# Patient Record
Sex: Female | Born: 1980 | Race: White | Hispanic: No | Marital: Married | Smoking: Never smoker
Health system: Southern US, Community
[De-identification: ages and names within clinical notes are randomized; demographics above are authoritative.]

## PROBLEM LIST (undated history)

## (undated) ENCOUNTER — Inpatient Hospital Stay (HOSPITAL_COMMUNITY): Payer: Self-pay

## (undated) DIAGNOSIS — R32 Unspecified urinary incontinence: Secondary | ICD-10-CM

## (undated) DIAGNOSIS — I10 Essential (primary) hypertension: Secondary | ICD-10-CM

## (undated) DIAGNOSIS — Z87442 Personal history of urinary calculi: Secondary | ICD-10-CM

## (undated) DIAGNOSIS — R51 Headache: Secondary | ICD-10-CM

## (undated) DIAGNOSIS — F419 Anxiety disorder, unspecified: Secondary | ICD-10-CM

## (undated) DIAGNOSIS — F32A Depression, unspecified: Secondary | ICD-10-CM

## (undated) DIAGNOSIS — F329 Major depressive disorder, single episode, unspecified: Secondary | ICD-10-CM

## (undated) HISTORY — PX: TONSILLECTOMY: SUR1361

## (undated) HISTORY — DX: Unspecified urinary incontinence: R32

## (undated) HISTORY — DX: Personal history of urinary calculi: Z87.442

---
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
## 2002-04-18 ENCOUNTER — Other Ambulatory Visit: Admission: RE | Admit: 2002-04-18 | Discharge: 2002-04-18 | Payer: Self-pay | Admitting: *Deleted

## 2003-01-10 ENCOUNTER — Other Ambulatory Visit: Admission: RE | Admit: 2003-01-10 | Discharge: 2003-01-10 | Payer: Self-pay | Admitting: Family Medicine

## 2004-03-26 ENCOUNTER — Other Ambulatory Visit: Admission: RE | Admit: 2004-03-26 | Discharge: 2004-03-26 | Payer: Self-pay | Admitting: Family Medicine

## 2005-02-21 ENCOUNTER — Encounter: Admission: RE | Admit: 2005-02-21 | Discharge: 2005-03-02 | Payer: Self-pay | Admitting: Family Medicine

## 2005-05-17 ENCOUNTER — Other Ambulatory Visit: Admission: RE | Admit: 2005-05-17 | Discharge: 2005-05-17 | Payer: Self-pay | Admitting: Obstetrics and Gynecology

## 2009-03-17 ENCOUNTER — Ambulatory Visit (HOSPITAL_COMMUNITY): Admission: RE | Admit: 2009-03-17 | Discharge: 2009-03-17 | Payer: Self-pay | Admitting: Obstetrics and Gynecology

## 2009-04-07 ENCOUNTER — Ambulatory Visit (HOSPITAL_COMMUNITY): Admission: RE | Admit: 2009-04-07 | Discharge: 2009-04-07 | Payer: Self-pay | Admitting: Obstetrics and Gynecology

## 2009-04-30 ENCOUNTER — Ambulatory Visit (HOSPITAL_COMMUNITY): Admission: RE | Admit: 2009-04-30 | Discharge: 2009-04-30 | Payer: Self-pay | Admitting: Obstetrics and Gynecology

## 2009-07-27 ENCOUNTER — Inpatient Hospital Stay (HOSPITAL_COMMUNITY): Admission: AD | Admit: 2009-07-27 | Discharge: 2009-08-01 | Payer: Self-pay | Admitting: Obstetrics and Gynecology

## 2010-09-19 LAB — COMPREHENSIVE METABOLIC PANEL
ALT: 21 U/L (ref 0–35)
ALT: 27 U/L (ref 0–35)
Albumin: 2 g/dL — ABNORMAL LOW (ref 3.5–5.2)
Albumin: 2.5 g/dL — ABNORMAL LOW (ref 3.5–5.2)
Alkaline Phosphatase: 186 U/L — ABNORMAL HIGH (ref 39–117)
BUN: 4 mg/dL — ABNORMAL LOW (ref 6–23)
BUN: 5 mg/dL — ABNORMAL LOW (ref 6–23)
BUN: 7 mg/dL (ref 6–23)
CO2: 18 mEq/L — ABNORMAL LOW (ref 19–32)
Calcium: 7.2 mg/dL — ABNORMAL LOW (ref 8.4–10.5)
Calcium: 7.7 mg/dL — ABNORMAL LOW (ref 8.4–10.5)
Calcium: 8.8 mg/dL (ref 8.4–10.5)
Chloride: 108 mEq/L (ref 96–112)
Creatinine, Ser: 0.64 mg/dL (ref 0.4–1.2)
Creatinine, Ser: 0.68 mg/dL (ref 0.4–1.2)
Creatinine, Ser: 0.69 mg/dL (ref 0.4–1.2)
GFR calc Af Amer: >60 mL/min (ref >60)
GFR calc Af Amer: >60 mL/min (ref >60)
GFR calc non Af Amer: >60 mL/min (ref >60)
Glucose, Bld: 84 mg/dL (ref 70–99)
Glucose, Bld: 89 mg/dL (ref 70–99)
Potassium: 3.8 mEq/L (ref 3.5–5.1)
Sodium: 134 mEq/L — ABNORMAL LOW (ref 135–145)
Sodium: 136 mEq/L (ref 135–145)
Total Bilirubin: 0.5 mg/dL (ref 0.3–1.2)
Total Bilirubin: 0.6 mg/dL (ref 0.3–1.2)
Total Protein: 5.2 g/dL — ABNORMAL LOW (ref 6.0–8.3)
Total Protein: 5.3 g/dL — ABNORMAL LOW (ref 6.0–8.3)

## 2010-09-19 LAB — URINALYSIS, ROUTINE W REFLEX MICROSCOPIC
Bilirubin Urine: NEGATIVE
Glucose, UA: NEGATIVE mg/dL
Ketones, ur: NEGATIVE mg/dL
pH: 6.5 (ref 5.0–8.0)

## 2010-09-19 LAB — CBC
HCT: 33.3 % — ABNORMAL LOW (ref 36.0–46.0)
HCT: 36 % (ref 36.0–46.0)
HCT: 38.1 % (ref 36.0–46.0)
Hemoglobin: 11 g/dL — ABNORMAL LOW (ref 12.0–15.0)
Hemoglobin: 11.7 g/dL — ABNORMAL LOW (ref 12.0–15.0)
MCHC: 32.3 g/dL (ref 30.0–36.0)
MCHC: 32.6 g/dL (ref 30.0–36.0)
MCHC: 32.9 g/dL (ref 30.0–36.0)
MCV: 84.8 fL (ref 78.0–100.0)
MCV: 85.7 fL (ref 78.0–100.0)
MCV: 86.6 fL (ref 78.0–100.0)
Platelets: 247 10*3/uL (ref 150–400)
RBC: 3.13 MIL/uL — ABNORMAL LOW (ref 3.87–5.11)
RBC: 3.78 MIL/uL — ABNORMAL LOW (ref 3.87–5.11)
RDW: 14.3 % (ref 11.5–15.5)
RDW: 14.6 % (ref 11.5–15.5)
RDW: 14.8 % (ref 11.5–15.5)
RDW: 14.9 % (ref 11.5–15.5)
WBC: 10.2 10*3/uL (ref 4.0–10.5)
WBC: 9.3 10*3/uL (ref 4.0–10.5)

## 2010-09-19 LAB — RPR
RPR Ser Ql: NONREACTIVE
RPR Ser Ql: NONREACTIVE

## 2010-09-19 LAB — URIC ACID
Uric Acid, Serum: 5.7 mg/dL (ref 2.4–7.0)
Uric Acid, Serum: 5.9 mg/dL (ref 2.4–7.0)

## 2010-09-19 LAB — URINALYSIS, DIPSTICK ONLY
Glucose, UA: NEGATIVE mg/dL
Hgb urine dipstick: NEGATIVE
Leukocytes, UA: NEGATIVE
pH: 6 (ref 5.0–8.0)

## 2010-09-19 LAB — URINE MICROSCOPIC-ADD ON

## 2010-09-19 LAB — LACTATE DEHYDROGENASE: LDH: 189 U/L (ref 94–250)

## 2011-11-01 ENCOUNTER — Encounter (HOSPITAL_COMMUNITY): Payer: Self-pay | Admitting: Pharmacist

## 2011-11-01 ENCOUNTER — Other Ambulatory Visit: Payer: Self-pay | Admitting: Obstetrics and Gynecology

## 2011-11-03 ENCOUNTER — Encounter (HOSPITAL_COMMUNITY): Payer: Self-pay | Admitting: *Deleted

## 2011-11-03 ENCOUNTER — Encounter (HOSPITAL_COMMUNITY): Payer: Self-pay | Admitting: Anesthesiology

## 2011-11-03 ENCOUNTER — Ambulatory Visit (HOSPITAL_COMMUNITY)
Admission: RE | Admit: 2011-11-03 | Discharge: 2011-11-03 | Disposition: A | Discharge status: Home / Self Care | Payer: BC Managed Care – PPO | Source: Ambulatory Visit | Attending: Obstetrics and Gynecology | Admitting: Obstetrics and Gynecology

## 2011-11-03 ENCOUNTER — Encounter (HOSPITAL_COMMUNITY)
Admission: RE | Disposition: A | Discharge status: Home / Self Care | Payer: Self-pay | Source: Ambulatory Visit | Attending: Obstetrics and Gynecology

## 2011-11-03 ENCOUNTER — Ambulatory Visit (HOSPITAL_COMMUNITY): Payer: BC Managed Care – PPO | Admitting: Anesthesiology

## 2011-11-03 DIAGNOSIS — O034 Incomplete spontaneous abortion without complication: Secondary | ICD-10-CM | POA: Insufficient documentation

## 2011-11-03 DIAGNOSIS — O039 Complete or unspecified spontaneous abortion without complication: Secondary | ICD-10-CM

## 2011-11-03 HISTORY — PX: DILATION AND EVACUATION: SHX1459

## 2011-11-03 LAB — CBC
Hemoglobin: 14.4 g/dL (ref 12.0–15.0)
MCH: 28.9 pg (ref 26.0–34.0)
Platelets: 148 10*3/uL — ABNORMAL LOW (ref 150–400)
RBC: 4.99 MIL/uL (ref 3.87–5.11)
WBC: 9.2 10*3/uL (ref 4.0–10.5)

## 2011-11-03 SURGERY — DILATION AND EVACUATION, UTERUS
Anesthesia: Monitor Anesthesia Care | Site: Uterus | Wound class: Clean Contaminated

## 2011-11-03 MED ORDER — MEPERIDINE HCL 25 MG/ML IJ SOLN: 6.2500 mg | INTRAMUSCULAR | Status: DC | PRN

## 2011-11-03 MED ORDER — ONDANSETRON HCL 4 MG/2ML IJ SOLN
INTRAMUSCULAR | Status: AC
  Filled 2011-11-03: qty 2

## 2011-11-03 MED ORDER — DEXAMETHASONE SODIUM PHOSPHATE 10 MG/ML IJ SOLN
INTRAMUSCULAR | Status: AC
  Filled 2011-11-03: qty 1

## 2011-11-03 MED ORDER — LIDOCAINE HCL (CARDIAC) 20 MG/ML IV SOLN
INTRAVENOUS | Status: AC
  Filled 2011-11-03: qty 5

## 2011-11-03 MED ORDER — LACTATED RINGERS IV SOLN
INTRAVENOUS | Status: DC
  Administered 2011-11-03 (×2): via INTRAVENOUS

## 2011-11-03 MED ORDER — MIDAZOLAM HCL 2 MG/2ML IJ SOLN
INTRAMUSCULAR | Status: AC
  Filled 2011-11-03: qty 2

## 2011-11-03 MED ORDER — METOCLOPRAMIDE HCL 5 MG/ML IJ SOLN: 10.0000 mg | Freq: Once | INTRAMUSCULAR | Status: DC | PRN

## 2011-11-03 MED ORDER — CEFAZOLIN SODIUM 1-5 GM-% IV SOLN
INTRAVENOUS | Status: AC
  Administered 2011-11-03: 1 g via INTRAVENOUS
  Filled 2011-11-03: qty 50

## 2011-11-03 MED ORDER — MIDAZOLAM HCL 5 MG/5ML IJ SOLN
INTRAMUSCULAR | Status: DC | PRN
  Administered 2011-11-03: 2 mg via INTRAVENOUS

## 2011-11-03 MED ORDER — DEXAMETHASONE SODIUM PHOSPHATE 4 MG/ML IJ SOLN
INTRAMUSCULAR | Status: DC | PRN
  Administered 2011-11-03: 10 mg via INTRAVENOUS

## 2011-11-03 MED ORDER — FENTANYL CITRATE 0.05 MG/ML IJ SOLN
INTRAMUSCULAR | Status: AC
  Administered 2011-11-03: 50 ug via INTRAVENOUS
  Filled 2011-11-03: qty 2

## 2011-11-03 MED ORDER — PROPOFOL 10 MG/ML IV EMUL
INTRAVENOUS | Status: AC
  Filled 2011-11-03: qty 20

## 2011-11-03 MED ORDER — OXYCODONE-ACETAMINOPHEN 5-325 MG PO TABS: 1.0000 | ORAL_TABLET | ORAL | Status: AC | PRN

## 2011-11-03 MED ORDER — PROPOFOL 10 MG/ML IV EMUL
INTRAVENOUS | Status: DC | PRN
  Administered 2011-11-03: 100 mg via INTRAVENOUS

## 2011-11-03 MED ORDER — FENTANYL CITRATE 0.05 MG/ML IJ SOLN
INTRAMUSCULAR | Status: AC
  Filled 2011-11-03: qty 2

## 2011-11-03 MED ORDER — OXYCODONE-ACETAMINOPHEN 5-325 MG PO TABS
1.0000 | ORAL_TABLET | ORAL | Status: DC | PRN
  Administered 2011-11-03: 1 via ORAL

## 2011-11-03 MED ORDER — FENTANYL CITRATE 0.05 MG/ML IJ SOLN
25.0000 ug | INTRAMUSCULAR | Status: DC | PRN
  Administered 2011-11-03 (×2): 50 ug via INTRAVENOUS

## 2011-11-03 MED ORDER — CEFAZOLIN SODIUM 1-5 GM-% IV SOLN: 1.0000 g | INTRAVENOUS | Status: DC

## 2011-11-03 MED ORDER — FENTANYL CITRATE 0.05 MG/ML IJ SOLN
INTRAMUSCULAR | Status: DC | PRN
  Administered 2011-11-03 (×2): 50 ug via INTRAVENOUS

## 2011-11-03 MED ORDER — LIDOCAINE HCL 1 % IJ SOLN
INTRAMUSCULAR | Status: DC | PRN
  Administered 2011-11-03: 10 mL

## 2011-11-03 MED ORDER — ONDANSETRON HCL 4 MG/2ML IJ SOLN
INTRAMUSCULAR | Status: DC | PRN
  Administered 2011-11-03: 4 mg via INTRAVENOUS

## 2011-11-03 MED ORDER — OXYCODONE-ACETAMINOPHEN 5-325 MG PO TABS
ORAL_TABLET | ORAL | Status: AC
  Filled 2011-11-03: qty 1

## 2011-11-03 SURGICAL SUPPLY — 23 items
CATH ROBINSON RED A/P 16FR (CATHETERS) ×2 IMPLANT
CLOTH BEACON ORANGE TIMEOUT ST (SAFETY) ×2 IMPLANT
DECANTER SPIKE VIAL GLASS SM (MISCELLANEOUS) ×4 IMPLANT
GLOVE BIOGEL PI IND STRL 7.0 (GLOVE) ×2 IMPLANT
GLOVE BIOGEL PI INDICATOR 7.0 (GLOVE) ×2
GLOVE ECLIPSE 7.0 STRL STRAW (GLOVE) ×4 IMPLANT
GLOVE SURG SS PI 7.0 STRL IVOR (GLOVE) ×2 IMPLANT
GLOVE SURG SS PI 7.5 STRL IVOR (GLOVE) ×2 IMPLANT
GOWN PREVENTION PLUS LG XLONG (DISPOSABLE) ×2 IMPLANT
GOWN PREVENTION PLUS XLARGE (GOWN DISPOSABLE) ×6 IMPLANT
GOWN SURG XXL (GOWNS) ×2 IMPLANT
KIT BERKELEY 1ST TRIMESTER 3/8 (MISCELLANEOUS) ×2 IMPLANT
NEEDLE SPNL 22GX3.5 QUINCKE BK (NEEDLE) ×2 IMPLANT
NS IRRIG 1000ML POUR BTL (IV SOLUTION) ×2 IMPLANT
PACK VAGINAL MINOR WOMEN LF (CUSTOM PROCEDURE TRAY) ×2 IMPLANT
PAD PREP 24X48 CUFFED NSTRL (MISCELLANEOUS) ×2 IMPLANT
SET BERKELEY SUCTION TUBING (SUCTIONS) ×2 IMPLANT
SYR CONTROL 10ML LL (SYRINGE) ×2 IMPLANT
TOWEL OR 17X24 6PK STRL BLUE (TOWEL DISPOSABLE) ×4 IMPLANT
VACURETTE 10 RIGID CVD (CANNULA) IMPLANT
VACURETTE 7MM CVD STRL WRAP (CANNULA) ×2 IMPLANT
VACURETTE 8 RIGID CVD (CANNULA) IMPLANT
VACURETTE 9 RIGID CVD (CANNULA) IMPLANT

## 2011-11-03 NOTE — Transfer of Care (Signed)
Immediate Anesthesia Transfer of Care Note  Patient: Laura Combs  Procedure(s) Performed: Procedure(s) (LRB): DILATATION AND EVACUATION (N/A)  Patient Location: PACU  Anesthesia Type: General  Level of Consciousness: awake, alert  and oriented  Airway & Oxygen Therapy: Patient Spontanous Breathing  Post-op Assessment: Report given to PACU RN  Post vital signs: Reviewed and stable  Complications: No apparent anesthesia complications

## 2011-11-03 NOTE — Anesthesia Postprocedure Evaluation (Signed)
  Anesthesia Post-op Note  Patient: Laura Combs  Procedure(s) Performed: Procedure(s) (LRB): DILATATION AND EVACUATION (N/A)  Patient Location: PACU  Anesthesia Type: General  Level of Consciousness: awake, alert  and oriented  Airway and Oxygen Therapy: Patient Spontanous Breathing  Post-op Pain: none  Post-op Assessment: Post-op Vital signs reviewed, Patient's Cardiovascular Status Stable, Respiratory Function Stable, Patent Airway, No signs of Nausea or vomiting and Pain level controlled  Post-op Vital Signs: Reviewed and stable  Complications: No apparent anesthesia complications

## 2011-11-03 NOTE — Op Note (Signed)
NAMEVALENCIA, Laura NO.:  Combs  MEDICAL RECORD NO.:  [id number]  LOCATION:  [facility]                          FACILITY:  [facility]  PHYSICIAN:  Malva Limes, M.D.    DATE OF BIRTH:  [birth year]  DATE OF PROCEDURE:  11/03/2011 DATE OF DISCHARGE:                              OPERATIVE REPORT   PREOPERATIVE DIAGNOSIS:  Spontaneous abortion.  POSTOPERATIVE DIAGNOSIS:  Spontaneous abortion.  PROCEDURE:  Dilation and evacuation.  SURGEON:  Malva Limes, MD  ANESTHESIA:  General with paracervical block.  DRAINS:  None.  ANTIBIOTICS:  Ancef 1 g.  SPECIMENS:  Products of conception sent to Pathology.  ESTIMATED BLOOD LOSS:  25 mL.  PREGNANCY COMPLICATIONS:  None.  DESCRIPTION OF PROCEDURE:  The patient was taken to the operating room where she was placed in a dorsal supine position.  A general anesthetic was administered without difficulty.  She was placed in dorsal lithotomy position.  She was prepped and draped in the usual fashion for this procedure.  A sterile speculum placed in the vagina, 10 mL of 1% lidocaine was used for paracervical block.  A single-tooth tenaculum was applied to the anterior cervical lip.  The cervical os was serially dilated to a 29-French.  A 7-mm suction cannula was placed into the uterine cavity.  Products of conception withdrawn.  Sharp curettage was performed followed by repeat suction.  The patient tolerated the procedure well.  She was taken to recovery room in stable condition. Instrument, lap count was correct x2.  The patient is to be discharged to home with Percocet to take p.r.n.  She will follow up in the office in 4 weeks.  The patient's blood type is Rh positive and therefore no RhoGAM is indicated.  She was told to call the office with any heavy bleeding, fever, or severe pain.          ______________________________ Malva Limes, M.D.     MA/MEDQ  D:  11/03/2011  T:  11/03/2011  Job:  [id number]

## 2011-11-03 NOTE — H&P (Signed)
Pt is a 31 year old white female who presents to the or for a D&C secondary to a SAB. PE: VSSAF        HEENT- wnl        ABD- soft, non tender IMP/SAB PLAN/ D&C

## 2011-11-03 NOTE — Discharge Instructions (Signed)

## 2011-11-03 NOTE — Anesthesia Preprocedure Evaluation (Signed)
Anesthesia Evaluation  Patient identified by MRN, date of birth, ID band Patient awake    Reviewed: Allergy & Precautions, H&P , NPO status , Patient's Chart, lab work & pertinent test results  Airway Mallampati: III TM Distance: >3 FB Neck ROM: full    Dental No notable dental hx. (+) Teeth Intact   Pulmonary neg pulmonary ROS,  breath sounds clear to auscultation  Pulmonary exam normal       Cardiovascular negative cardio ROS  Rhythm:regular Rate:Normal     Neuro/Psych negative neurological ROS  negative psych ROS   GI/Hepatic negative GI ROS, Neg liver ROS,   Endo/Other  negative endocrine ROS  Renal/GU negative Renal ROS  negative genitourinary   Musculoskeletal   Abdominal Normal abdominal exam  (+)   Peds  Hematology negative hematology ROS (+)   Anesthesia Other Findings   Reproductive/Obstetrics (+) Pregnancy                           Anesthesia Physical Anesthesia Plan  ASA: III  Anesthesia Plan: MAC   Post-op Pain Management:    Induction:   Airway Management Planned:   Additional Equipment:   Intra-op Plan:   Post-operative Plan:   Informed Consent: I have reviewed the patients History and Physical, chart, labs and discussed the procedure including the risks, benefits and alternatives for the proposed anesthesia with the patient or authorized representative who has indicated his/her understanding and acceptance.   Dental Advisory Given  Plan Discussed with: Surgeon, Anesthesiologist and CRNA  Anesthesia Plan Comments:         Anesthesia Quick Evaluation

## 2011-11-03 NOTE — Anesthesia Procedure Notes (Signed)
Procedure Name: LMA Insertion Date/Time: 11/03/2011 8:32 AM Performed by: Floye Fesler, Jannet Askew Pre-anesthesia Checklist: Patient identified, Patient being monitored, Emergency Drugs available and Timeout performed Patient Re-evaluated:Patient Re-evaluated prior to inductionOxygen Delivery Method: Circle system utilized Preoxygenation: Pre-oxygenation with 100% oxygen Intubation Type: IV induction Ventilation: Mask ventilation without difficulty Number of attempts: 1 Placement Confirmation: positive ETCO2 and breath sounds checked- equal and bilateral

## 2011-11-07 ENCOUNTER — Encounter (HOSPITAL_COMMUNITY): Payer: Self-pay | Admitting: Obstetrics and Gynecology

## 2012-04-11 LAB — OB RESULTS CONSOLE GC/CHLAMYDIA
Chlamydia: NEGATIVE
Gonorrhea: NEGATIVE

## 2012-04-11 LAB — OB RESULTS CONSOLE HEPATITIS B SURFACE ANTIGEN: Hepatitis B Surface Ag: NEGATIVE

## 2012-04-11 LAB — OB RESULTS CONSOLE RPR: RPR: NONREACTIVE

## 2012-04-11 LAB — OB RESULTS CONSOLE ANTIBODY SCREEN: Antibody Screen: NEGATIVE

## 2012-06-06 ENCOUNTER — Other Ambulatory Visit: Payer: Self-pay

## 2012-06-06 ENCOUNTER — Other Ambulatory Visit (HOSPITAL_COMMUNITY): Payer: Self-pay | Admitting: Obstetrics and Gynecology

## 2012-06-06 DIAGNOSIS — Z3689 Encounter for other specified antenatal screening: Secondary | ICD-10-CM

## 2012-06-28 ENCOUNTER — Ambulatory Visit (HOSPITAL_COMMUNITY)
Admission: RE | Admit: 2012-06-28 | Discharge: 2012-06-28 | Disposition: A | Discharge status: Home / Self Care | Payer: BC Managed Care – PPO | Source: Ambulatory Visit | Attending: Obstetrics and Gynecology | Admitting: Obstetrics and Gynecology

## 2012-06-28 DIAGNOSIS — Z1389 Encounter for screening for other disorder: Secondary | ICD-10-CM | POA: Insufficient documentation

## 2012-06-28 DIAGNOSIS — Z363 Encounter for antenatal screening for malformations: Secondary | ICD-10-CM | POA: Insufficient documentation

## 2012-06-28 DIAGNOSIS — O358XX0 Maternal care for other (suspected) fetal abnormality and damage, not applicable or unspecified: Secondary | ICD-10-CM | POA: Insufficient documentation

## 2012-06-28 DIAGNOSIS — E669 Obesity, unspecified: Secondary | ICD-10-CM | POA: Insufficient documentation

## 2012-06-28 DIAGNOSIS — Z3689 Encounter for other specified antenatal screening: Secondary | ICD-10-CM

## 2012-07-04 NOTE — L&D Delivery Note (Signed)
Patient was C/C/+3 and pushed for 2 minutes with epidural.   NSVD  female infant, Apgars 9,9, weight P.   The patient had no lacerations. Fundus was firm. EBL was expected. Placenta was delivered intact. Vagina was clear.  Baby was vigorous to bedside.  Bernard Slayden A

## 2012-07-11 ENCOUNTER — Encounter (HOSPITAL_COMMUNITY): Payer: Self-pay | Admitting: Nurse Practitioner

## 2012-07-16 ENCOUNTER — Other Ambulatory Visit (HOSPITAL_COMMUNITY): Payer: Self-pay | Admitting: Obstetrics and Gynecology

## 2012-07-16 DIAGNOSIS — O358XX0 Maternal care for other (suspected) fetal abnormality and damage, not applicable or unspecified: Secondary | ICD-10-CM

## 2012-07-19 ENCOUNTER — Encounter (HOSPITAL_COMMUNITY): Payer: Self-pay

## 2012-07-19 ENCOUNTER — Ambulatory Visit (HOSPITAL_COMMUNITY)
Admission: RE | Admit: 2012-07-19 | Discharge: 2012-07-19 | Disposition: A | Discharge status: Home / Self Care | Payer: BC Managed Care – PPO | Source: Ambulatory Visit | Attending: Obstetrics and Gynecology | Admitting: Obstetrics and Gynecology

## 2012-07-19 VITALS — BP 142/84 | HR 96 | Wt 275.2 lb

## 2012-07-19 DIAGNOSIS — Z3689 Encounter for other specified antenatal screening: Secondary | ICD-10-CM | POA: Insufficient documentation

## 2012-07-19 DIAGNOSIS — O99210 Obesity complicating pregnancy, unspecified trimester: Secondary | ICD-10-CM | POA: Insufficient documentation

## 2012-07-19 DIAGNOSIS — O358XX0 Maternal care for other (suspected) fetal abnormality and damage, not applicable or unspecified: Secondary | ICD-10-CM

## 2012-07-19 DIAGNOSIS — E669 Obesity, unspecified: Secondary | ICD-10-CM | POA: Insufficient documentation

## 2012-07-19 NOTE — Progress Notes (Signed)
Laura Combs  was seen today for an ultrasound appointment.  See full report in [facility].  Impression: Single IUP at 24 2/7 weeks Normal fetal anatomic survey Somewhat limited views of the fetal heart were obtained, but the [facility] and outflow tracts were visualized and appear normal No markers associated with aneuploidy were noted Normal amniotic fluid volume  Recommendations: Recommend follow-up ultrasound examination in 6 weeks for growth.  Alpha Gula, MD

## 2012-08-12 ENCOUNTER — Inpatient Hospital Stay (HOSPITAL_COMMUNITY)
Admission: AD | Admit: 2012-08-12 | Discharge: 2012-08-12 | Disposition: A | Discharge status: Home / Self Care | Payer: BC Managed Care – PPO | Source: Ambulatory Visit | Attending: Obstetrics and Gynecology | Admitting: Obstetrics and Gynecology

## 2012-08-12 ENCOUNTER — Encounter (HOSPITAL_COMMUNITY): Payer: Self-pay | Admitting: Obstetrics and Gynecology

## 2012-08-12 DIAGNOSIS — O99891 Other specified diseases and conditions complicating pregnancy: Secondary | ICD-10-CM | POA: Insufficient documentation

## 2012-08-12 DIAGNOSIS — M549 Dorsalgia, unspecified: Secondary | ICD-10-CM | POA: Insufficient documentation

## 2012-08-12 LAB — URINALYSIS, ROUTINE W REFLEX MICROSCOPIC
Bilirubin Urine: NEGATIVE
Ketones, ur: NEGATIVE mg/dL
Nitrite: NEGATIVE
Protein, ur: NEGATIVE mg/dL
Urobilinogen, UA: 0.2 mg/dL (ref 0.0–1.0)

## 2012-08-12 MED ORDER — OXYCODONE-ACETAMINOPHEN 5-325 MG PO TABS
1.0000 | ORAL_TABLET | Freq: Four times a day (QID) | ORAL | Status: DC | PRN
  Administered 2012-08-12: 2 via ORAL
  Filled 2012-08-12: qty 2

## 2012-08-12 MED ORDER — OXYCODONE-ACETAMINOPHEN 5-325 MG PO TABS: 1.0000 | ORAL_TABLET | ORAL | Status: DC | PRN

## 2012-08-12 NOTE — MAU Note (Signed)
"  I started hurting on Thursday off and on.  I was fine yesterday.  The pain became severe and constant on this morning.  The pain is on the LT side of my back (pt demonstrates with hand).  I've never had a kidney stone or infection before, but I know I have something."

## 2012-08-12 NOTE — MAU Provider Note (Signed)
Chief Complaint:  Flank Pain   First Provider Initiated Contact with Patient 08/12/12 1149      HPI: Laura Combs is a 32 y.o. G3P1011 at [redacted]w[redacted]d who presents to maternity admissions reporting onset  3 days ago of left back pain localized to lumbar and mid back paraspinous region. 3 days ago it lasted 2 hours and spontaneously resolved. She had pain intermittently the next day and minimal pain yesterday. Today she's had constant pain since 7 AM and now "very uncomfortable." Has not taken any analgesic. Character is sharp like a continuous cramp. It can be exacerbated by deep breath or position changes. Does not radiate. No antecedent lifting or straining. No dysuria, hematuria, frequency or urgency of urination. No SOB, abdominal pain, fever, chills, nausea vomiting other than slight nausea when she initially had the pain.  Denies contractions, leakage of fluid or vaginal bleeding. Good fetal movement.   Pregnancy Course: Obesity, abnormal glucola,  Past Medical History: History reviewed. No pertinent past medical history. Denies hx stones.   Past obstetric history: OB History   Grav Para Term Preterm Abortions TAB SAB Ect Mult Living   2 0 0 0 1 0 1 0 0 0      # Outc Date GA Lbr Len/2nd Wgt Sex Del Anes PTL Lv   1 SAB            2 CUR             OB Hx: NSVD hx preE and SAB w/ D&E   Past Surgical History: Past Surgical History  Procedure Laterality Date  . Dilation and evacuation  11/03/2011    Procedure: DILATATION AND EVACUATION;  Surgeon: Levi Aland, MD;  Location: [facility];  Service: Gynecology;  Laterality: N/A;    Family History: History reviewed. No pertinent family history.  Social History: History  Substance Use Topics  . Smoking status: Never Smoker   . Smokeless tobacco: Never Used  . Alcohol Use: No    Allergies: No Known Allergies  Meds:  Prescriptions prior to admission  Medication Sig Dispense Refill  . acetaminophen (TYLENOL) 500 MG tablet Take 1,000  mg by mouth every 6 (six) hours as needed for pain.      . Prenatal Vit-Fe Fumarate-FA (PRENATAL MULTIVITAMIN) TABS Take 1 tablet by mouth daily.        ROS: Pertinent findings in history of present illness.  Physical Exam  Height 5\' 3"  (1.6 m), weight 274 lb 12.8 oz (124.648 kg), last menstrual period 01/31/2012. GENERAL: Well-developed, well-nourished female in mild acute distress.  HEENT: normocephalic HEART: normal rate Lungs: CTA bilat BACK: no CVAT, mildly TTP left back at waist level RESP: normal effort ABDOMEN: Soft, non-tender, gravid appropriate for gestational age EXTREMITIES: Nontender, no edema NEURO: alert and oriented, grossly nonfocal    FHT:  Baseline 140 , moderate variability, accelerations present, no decelerations Contractions: none   Labs: Results for orders placed during the hospital encounter of 08/12/12 (from the past 24 hour(s))  URINALYSIS, ROUTINE W REFLEX MICROSCOPIC     Status: Abnormal   Collection Time    08/12/12 10:45 AM      Result Value Range   Color, Urine YELLOW  YELLOW   APPearance CLEAR  CLEAR   Specific Gravity, Urine 1.015  1.005 - 1.030   pH 7.0  5.0 - 8.0   Glucose, UA NEGATIVE  NEGATIVE mg/dL   Hgb urine dipstick SMALL (*) NEGATIVE   Bilirubin Urine NEGATIVE  NEGATIVE  Ketones, ur NEGATIVE  NEGATIVE mg/dL   Protein, ur NEGATIVE  NEGATIVE mg/dL   Urobilinogen, UA 0.2  0.0 - 1.0 mg/dL   Nitrite NEGATIVE  NEGATIVE   Leukocytes, UA SMALL (*) NEGATIVE  URINE MICROSCOPIC-ADD ON     Status: Abnormal   Collection Time    08/12/12 10:45 AM      Result Value Range   Squamous Epithelial / LPF MANY (*) RARE   WBC, UA 3-6  <3 WBC/hpf   RBC / HPF 7-10  <3 RBC/hpf   Bacteria, UA MANY (*) RARE   Urine-Other MUCOUS PRESENT     Urine culture sent  Imaging:  US Ob Follow Up  07/19/2012  OBSTETRICAL ULTRASOUND: This exam was performed within a [facility]. The OB US report was generated in the [facility] system, and faxed  to the ordering physician.   This report is also available in [employer] and in the [employer]. See [facility] Obstetric US report.   MAU Course: Percocet  given with improvement Urine C&S sent  Assessment: 1. Back pain complicating pregnancy, second trimester   Probably musculoskeletal. R/O UTI. Doublt ureteral calculus  Plan: C/W Dr. Dareen Piano Discharge home Push fluids    Medication List    TAKE these medications       acetaminophen 500 MG tablet  Commonly known as:  TYLENOL  Take 1,000 mg by mouth every 6 (six) hours as needed for pain.     oxyCODONE-acetaminophen 5-325 MG per tablet  Commonly known as:  PERCOCET/ROXICET  Take 1 tablet by mouth every 4 (four) hours as needed for pain.     prenatal multivitamin Tabs  Take 1 tablet by mouth daily.       Follow-up Information   Schedule an appointment as soon as possible for a visit with Levi Aland, MD. (If symptoms worsen)    Contact information:   [address] [place] [address] [phone]       Danae Orleans, CNM 08/12/2012 11:50 AM  Addendum: Pre-discharge BP 149/86. Will observe and recheck in 15 min.>144/83 (Pt states that is her norm. Full PN record not available) Will D/C home.

## 2012-08-13 LAB — URINE CULTURE: Colony Count: 50000

## 2012-08-17 ENCOUNTER — Encounter (HOSPITAL_COMMUNITY): Payer: Self-pay | Admitting: Emergency Medicine

## 2012-08-17 ENCOUNTER — Encounter (HOSPITAL_COMMUNITY): Payer: Self-pay | Admitting: *Deleted

## 2012-08-17 ENCOUNTER — Emergency Department (INDEPENDENT_AMBULATORY_CARE_PROVIDER_SITE_OTHER): Payer: BC Managed Care – PPO

## 2012-08-17 ENCOUNTER — Emergency Department (HOSPITAL_COMMUNITY)
Admission: EM | Admit: 2012-08-17 | Discharge: 2012-08-18 | Disposition: A | Discharge status: Home / Self Care | Payer: BC Managed Care – PPO | Attending: Emergency Medicine | Admitting: Emergency Medicine

## 2012-08-17 ENCOUNTER — Emergency Department (HOSPITAL_COMMUNITY)
Admission: EM | Admit: 2012-08-17 | Discharge: 2012-08-17 | Disposition: A | Discharge status: Nursing Home (Includes ICF) | Payer: BC Managed Care – PPO | Source: Home / Self Care | Attending: Family Medicine | Admitting: Family Medicine

## 2012-08-17 DIAGNOSIS — R059 Cough, unspecified: Secondary | ICD-10-CM | POA: Insufficient documentation

## 2012-08-17 DIAGNOSIS — J029 Acute pharyngitis, unspecified: Secondary | ICD-10-CM | POA: Insufficient documentation

## 2012-08-17 DIAGNOSIS — R0602 Shortness of breath: Secondary | ICD-10-CM | POA: Insufficient documentation

## 2012-08-17 DIAGNOSIS — R000 Tachycardia, unspecified: Secondary | ICD-10-CM

## 2012-08-17 DIAGNOSIS — Z3490 Encounter for supervision of normal pregnancy, unspecified, unspecified trimester: Secondary | ICD-10-CM

## 2012-08-17 DIAGNOSIS — R05 Cough: Secondary | ICD-10-CM | POA: Insufficient documentation

## 2012-08-17 DIAGNOSIS — Z79899 Other long term (current) drug therapy: Secondary | ICD-10-CM | POA: Insufficient documentation

## 2012-08-17 DIAGNOSIS — R509 Fever, unspecified: Secondary | ICD-10-CM | POA: Insufficient documentation

## 2012-08-17 DIAGNOSIS — J069 Acute upper respiratory infection, unspecified: Secondary | ICD-10-CM

## 2012-08-17 DIAGNOSIS — R490 Dysphonia: Secondary | ICD-10-CM | POA: Insufficient documentation

## 2012-08-17 DIAGNOSIS — O26893 Other specified pregnancy related conditions, third trimester: Secondary | ICD-10-CM

## 2012-08-17 DIAGNOSIS — R0982 Postnasal drip: Secondary | ICD-10-CM | POA: Insufficient documentation

## 2012-08-17 DIAGNOSIS — J3489 Other specified disorders of nose and nasal sinuses: Secondary | ICD-10-CM | POA: Insufficient documentation

## 2012-08-17 DIAGNOSIS — R0789 Other chest pain: Secondary | ICD-10-CM | POA: Insufficient documentation

## 2012-08-17 DIAGNOSIS — E876 Hypokalemia: Secondary | ICD-10-CM

## 2012-08-17 DIAGNOSIS — O26899 Other specified pregnancy related conditions, unspecified trimester: Secondary | ICD-10-CM

## 2012-08-17 DIAGNOSIS — O9989 Other specified diseases and conditions complicating pregnancy, childbirth and the puerperium: Secondary | ICD-10-CM | POA: Insufficient documentation

## 2012-08-17 LAB — URINALYSIS, ROUTINE W REFLEX MICROSCOPIC
Glucose, UA: NEGATIVE mg/dL
Ketones, ur: 40 mg/dL — AB
pH: 6 (ref 5.0–8.0)

## 2012-08-17 LAB — BASIC METABOLIC PANEL
BUN: 5 mg/dL — ABNORMAL LOW (ref 6–23)
Calcium: 8.3 mg/dL — ABNORMAL LOW (ref 8.4–10.5)
GFR calc non Af Amer: >90 mL/min (ref >90)
Glucose, Bld: 91 mg/dL (ref 70–99)
Sodium: 139 mEq/L (ref 135–145)

## 2012-08-17 LAB — CBC WITH DIFFERENTIAL/PLATELET
Basophils Relative: 0 % (ref 0–1)
Eosinophils Absolute: 0 10*3/uL (ref 0.0–0.7)
Eosinophils Relative: 0 % (ref 0–5)
Lymphs Abs: 1 10*3/uL (ref 0.7–4.0)
MCH: 28.9 pg (ref 26.0–34.0)
MCHC: 33.9 g/dL (ref 30.0–36.0)
MCV: 85.3 fL (ref 78.0–100.0)
Monocytes Relative: 8 % (ref 3–12)
Platelets: 158 10*3/uL (ref 150–400)
RBC: 3.87 MIL/uL (ref 3.87–5.11)

## 2012-08-17 LAB — HEPATIC FUNCTION PANEL
ALT: 11 U/L (ref 0–35)
AST: 17 U/L (ref 0–37)
Alkaline Phosphatase: 90 U/L (ref 39–117)
Bilirubin, Direct: <0.1 mg/dL (ref 0.0–0.3)
Total Bilirubin: 0.2 mg/dL — ABNORMAL LOW (ref 0.3–1.2)

## 2012-08-17 MED ORDER — POTASSIUM CHLORIDE CRYS ER 20 MEQ PO TBCR
40.0000 meq | EXTENDED_RELEASE_TABLET | Freq: Once | ORAL | Status: AC
  Administered 2012-08-18: 40 meq via ORAL
  Filled 2012-08-17: qty 2

## 2012-08-17 MED ORDER — SODIUM CHLORIDE 0.9 % IV BOLUS (SEPSIS)
1000.0000 mL | Freq: Once | INTRAVENOUS | Status: AC
  Administered 2012-08-18: 1000 mL via INTRAVENOUS

## 2012-08-17 MED ORDER — ACETAMINOPHEN 325 MG PO TABS
650.0000 mg | ORAL_TABLET | Freq: Once | ORAL | Status: AC
  Administered 2012-08-18: 650 mg via ORAL
  Filled 2012-08-17: qty 2

## 2012-08-17 NOTE — ED Notes (Signed)
C/o sob and non-productive cough x 2 days.  Pt sent from [facility]. [redacted] weeks pregnant.

## 2012-08-17 NOTE — Progress Notes (Signed)
Called to see 28w 3d G3P1 pt with presenting co of shortness of breath. FHR tracing reassuring, + fetal movement, pt feels good fetal movement and has no c/o cramping, contractions or vag bleeding discharge. Pt was seen at MAU on 2/9 for L flank pain. Not having flank pain now.

## 2012-08-17 NOTE — ED Notes (Signed)
Patient complains of shortness of breath with cough and pain x 2 days; fever chills x 1 day. Worsened by talking and activity. Patient is 28.[redacted] weeks pregnant.

## 2012-08-17 NOTE — ED Provider Notes (Signed)
History     CSN: [id number]  Arrival date & time 08/17/12  1825   First MD Initiated Contact with Patient 08/17/12 1828      Chief Complaint  Patient presents with  . URI    (Consider location/radiation/quality/duration/timing/severity/associated sxs/prior treatment) Patient is a 32 y.o. female presenting with URI. The history is provided by the patient.  URI Presenting symptoms: congestion, cough, fever and rhinorrhea   Severity:  Mild Onset quality:  Sudden Duration:  2 days Progression:  Worsening Chronicity:  New Associated symptoms: no wheezing   Associated symptoms comment:  Dyspnea on exertion.   History reviewed. No pertinent past medical history.  Past Surgical History  Procedure Laterality Date  . Dilation and evacuation  11/03/2011    Procedure: DILATATION AND EVACUATION;  Surgeon: Levi Aland, MD;  Location: [facility];  Service: Gynecology;  Laterality: N/A;    No family history on file.  History  Substance Use Topics  . Smoking status: Never Smoker   . Smokeless tobacco: Never Used  . Alcohol Use: No    OB History   Grav Para Term Preterm Abortions TAB SAB Ect Mult Living   2 0 0 0 1 0 1 0 0 0       Review of Systems  Constitutional: Positive for fever and chills.  HENT: Positive for congestion and rhinorrhea.   Respiratory: Positive for cough and shortness of breath. Negative for wheezing.   Cardiovascular: Negative.   Gastrointestinal: Negative.   Genitourinary: Negative.     Allergies  Review of patient's allergies indicates no known allergies.  Home Medications   Current Outpatient Rx  Name  Route  Sig  Dispense  Refill  . acetaminophen (TYLENOL) 500 MG tablet   Oral   Take 1,000 mg by mouth every 6 (six) hours as needed for pain.         [name] oxyCODONE-acetaminophen (PERCOCET/ROXICET) 5-325 MG per tablet   Oral   Take 1 tablet by mouth every 4 (four) hours as needed for pain.   20 tablet   0   . Prenatal Vit-Fe Fumarate-FA  (PRENATAL MULTIVITAMIN) TABS   Oral   Take 1 tablet by mouth daily.           BP 131/88  Pulse 118  Temp(Src) 99.1 F (37.3 C) (Oral)  Resp 22  SpO2 98%  LMP 01/31/2012  Physical Exam  Nursing note and vitals reviewed. Constitutional: She is oriented to person, place, and time. She appears well-developed and well-nourished.  HENT:  Head: Normocephalic.  Mouth/Throat: Oropharynx is clear and moist.  Eyes: Pupils are equal, round, and reactive to light.  Neck: Normal range of motion. Neck supple.  Cardiovascular: Regular rhythm, normal heart sounds and normal pulses.  Tachycardia present.   Pulmonary/Chest: Effort normal. She has decreased breath sounds in the left lower field.  Abdominal: Soft. Bowel sounds are normal. She exhibits mass.  Musculoskeletal: She exhibits edema.  Lymphadenopathy:    She has no cervical adenopathy.  Neurological: She is alert and oriented to person, place, and time.  Skin: Skin is warm and dry.    ED Course  Procedures (including critical care time)  Labs Reviewed - No data to display Dg Chest 2 View  08/17/2012  *RADIOLOGY REPORT*  Clinical Data: Shortness of breath, cough.  CHEST - 2 VIEW  Comparison: 07/28/2009  Findings: Low lung volumes with bibasilar densities, most likely atelectasis.  Mild peribronchial thickening.  Heart is normal size. No effusions.  No acute  bony abnormality.  IMPRESSION: Low lung volumes with bibasilar densities, likely atelectasis.  Mild bronchitic changes.   Original Report Authenticated By: Charlett Nose, M.D.      1. Gestational dyspnea, third trimester       MDM  X-rays reviewed and report per radiologist.  Have discussed with DR.Horvath-rec sending to [facility] to r/o PE.         Linna Hoff, MD 08/17/12 2056

## 2012-08-17 NOTE — ED Notes (Signed)
Rapid response ob nurse here to see pt.  Pt connected to our monitor and the ob monitor. No adv rx noted.

## 2012-08-18 MED ORDER — AZITHROMYCIN 250 MG PO TABS
500.0000 mg | ORAL_TABLET | Freq: Once | ORAL | Status: AC
  Administered 2012-08-18: 500 mg via ORAL
  Filled 2012-08-18: qty 2

## 2012-08-18 MED ORDER — AZITHROMYCIN 250 MG PO TABS: 250.0000 mg | ORAL_TABLET | Freq: Every day | ORAL | Status: DC

## 2012-08-18 MED ORDER — POTASSIUM CHLORIDE ER 10 MEQ PO TBCR: 10.0000 meq | EXTENDED_RELEASE_TABLET | Freq: Two times a day (BID) | ORAL | Status: DC

## 2012-08-18 NOTE — ED Notes (Signed)
PT DC TO HOME.  PT STATES UNDERSTANDING TO DC INSTRUCTIONS.  PT AMBULATORY TO EXIT WITHOUT DIFFICULTY.  PT DENIES NEED FOR W/C.

## 2012-08-18 NOTE — Progress Notes (Signed)
Pt has an appointment at [facility] on 3/4. Advised pt to call on Monday and see OB MD next week. Call placed to Dr Henderson Cloud. Instructed pt to go to [facility] if sx increase over weekend.

## 2012-08-18 NOTE — ED Provider Notes (Signed)
History     CSN: [id number]  Arrival date & time 08/17/12  2111   First MD Initiated Contact with Patient 08/17/12 2202      Chief Complaint  Patient presents with  . Shortness of Breath    (Consider location/radiation/quality/duration/timing/severity/associated sxs/prior treatment) The history is provided by the patient, medical records and the spouse.    Laura Combs is a 32 y.o. female  with a hx of gestational HTN presents to the Emergency Department complaining of gradual, persistent, progressively worsening SOB onset 2 days ago.  Patient states he began to have cough and shortness of breath 2 days. She states that she has chest wall/rib pain when she coughs but not at rest. She's also had fever and chills for one day. She began taking Mucinex today which has helped some. She is also taking Tylenol which has also helped. Movement and exertion makes her symptoms worse. Patient also has sinus congestion, rhinorrhea, postnasal drip, sore throat and hoarse voice.Patient has no history of DVT or blood clot. Pt denies headache, neck pain, abdominal pain, nausea, vomiting, diarrhea, cramping, contractions, dysuria, hematuria, weakness, dizziness, syncope.     History reviewed. No pertinent past medical history.  Past Surgical History  Procedure Laterality Date  . Dilation and evacuation  11/03/2011    Procedure: DILATATION AND EVACUATION;  Surgeon: Levi Aland, MD;  Location: [facility];  Service: Gynecology;  Laterality: N/A;    No family history on file.  History  Substance Use Topics  . Smoking status: Never Smoker   . Smokeless tobacco: Never Used  . Alcohol Use: No    OB History   Grav Para Term Preterm Abortions TAB SAB Ect Mult Living   2 0 0 0 1 0 1 0 0 0       Review of Systems  Constitutional: Positive for fever and chills. Negative for diaphoresis, appetite change, fatigue and unexpected weight change.  HENT: Positive for congestion, sore throat, rhinorrhea,  voice change, postnasal drip and sinus pressure. Negative for mouth sores and neck stiffness.   Eyes: Negative for visual disturbance.  Respiratory: Positive for cough, chest tightness and shortness of breath. Negative for wheezing.   Cardiovascular: Negative for chest pain.  Gastrointestinal: Negative for nausea, vomiting, abdominal pain, diarrhea and constipation.  Genitourinary: Negative for dysuria, urgency, frequency and hematuria.  Musculoskeletal: Negative for back pain.  Skin: Negative for rash.  Neurological: Negative for syncope, light-headedness and headaches.  Hematological: Does not bruise/bleed easily.  Psychiatric/Behavioral: Negative for sleep disturbance. The patient is not nervous/anxious.   All other systems reviewed and are negative.    Allergies  Review of patient's allergies indicates no known allergies.  Home Medications   Current Outpatient Rx  Name  Route  Sig  Dispense  Refill  . acetaminophen (TYLENOL) 500 MG tablet   Oral   Take 1,000 mg by mouth every 6 (six) hours as needed for pain.         [name] Phenylephrine-DM-GG-APAP (MUCINEX FAST-MAX COLD FLU PO)   Oral   Take 20 mLs by mouth every 4 (four) hours as needed (for severe congestion).         . Prenatal Vit-Fe Fumarate-FA (PRENATAL MULTIVITAMIN) TABS   Oral   Take 1 tablet by mouth daily.         [name] azithromycin (ZITHROMAX) 250 MG tablet   Oral   Take 1 tablet (250 mg total) by mouth daily. Take first 2 tablets together, then 1 every day until finished.  6 tablet   0   . potassium chloride (K-DUR) 10 MEQ tablet   Oral   Take 1 tablet (10 mEq total) by mouth 2 (two) times daily.   30 tablet   0     BP 131/78  Pulse 117  Temp(Src) 99.2 F (37.3 C) (Oral)  Resp 20  SpO2 97%  LMP 01/31/2012  Physical Exam  Nursing note and vitals reviewed. Constitutional: She is oriented to person, place, and time. She appears well-developed and well-nourished. No distress.  HENT:  Head:  Normocephalic and atraumatic.  Right Ear: Tympanic membrane, external ear and ear canal normal.  Left Ear: Tympanic membrane, external ear and ear canal normal.  Nose: Mucosal edema and rhinorrhea present. Right sinus exhibits no maxillary sinus tenderness and no frontal sinus tenderness. Left sinus exhibits no maxillary sinus tenderness and no frontal sinus tenderness.  Mouth/Throat: Uvula is midline, oropharynx is clear and moist and mucous membranes are normal. Mucous membranes are not dry and not cyanotic. No oropharyngeal exudate, posterior oropharyngeal edema, posterior oropharyngeal erythema or tonsillar abscesses.  Eyes: Conjunctivae and EOM are normal. Pupils are equal, round, and reactive to light. No scleral icterus.  Neck: Normal range of motion. Neck supple.  Cardiovascular: Regular rhythm, S1 normal, S2 normal, normal heart sounds and intact distal pulses.  Tachycardia present.   Pulses:      Radial pulses are 2+ on the right side, and 2+ on the left side.       Dorsalis pedis pulses are 2+ on the right side, and 2+ on the left side.       Posterior tibial pulses are 2+ on the right side, and 2+ on the left side.  Pulmonary/Chest: Effort normal. No accessory muscle usage. Not tachypneic. No respiratory distress. She has decreased breath sounds (thruoghout). She has no wheezes. She has no rhonchi. She has no rales. She exhibits tenderness (mild).  Abdominal: Soft. Normal appearance and bowel sounds are normal. She exhibits no mass. There is no tenderness. There is no rigidity, no rebound, no guarding and no CVA tenderness.  Gravid uterus; no palpable contractions  Musculoskeletal: Normal range of motion. She exhibits no edema and no tenderness.  Lymphadenopathy:    She has no cervical adenopathy.  Neurological: She is alert and oriented to person, place, and time. No cranial nerve deficit. She exhibits normal muscle tone. Coordination normal.  Speech is clear and goal oriented Moves  extremities without ataxia  Skin: Skin is warm and dry. No rash noted. She is not diaphoretic. No erythema.  Psychiatric: She has a normal mood and affect.    ED Course  Procedures (including critical care time)  Labs Reviewed  D-DIMER, QUANTITATIVE - Abnormal; Notable for the following:    D-Dimer, Quant 1.03 (*)    All other components within normal limits  CBC WITH DIFFERENTIAL - Abnormal; Notable for the following:    Hemoglobin 11.2 (*)    HCT 33.0 (*)    Neutrophils Relative 80 (*)    All other components within normal limits  BASIC METABOLIC PANEL - Abnormal; Notable for the following:    Potassium 2.8 (*)    BUN 5 (*)    Calcium 8.3 (*)    All other components within normal limits  URINALYSIS, ROUTINE W REFLEX MICROSCOPIC - Abnormal; Notable for the following:    Color, Urine AMBER (*)    APPearance CLOUDY (*)    Specific Gravity, Urine 1.031 (*)    Bilirubin Urine SMALL (*)  Ketones, ur 40 (*)    Protein, ur 30 (*)    Leukocytes, UA SMALL (*)    All other components within normal limits  HEPATIC FUNCTION PANEL - Abnormal; Notable for the following:    Albumin 2.6 (*)    Total Bilirubin 0.2 (*)    All other components within normal limits  URINE MICROSCOPIC-ADD ON - Abnormal; Notable for the following:    Squamous Epithelial / LPF MANY (*)    Bacteria, UA FEW (*)    All other components within normal limits  URINE CULTURE  LACTATE DEHYDROGENASE  URIC ACID   Dg Chest 2 View  08/17/2012  *RADIOLOGY REPORT*  Clinical Data: Shortness of breath, cough.  CHEST - 2 VIEW  Comparison: 07/28/2009  Findings: Low lung volumes with bibasilar densities, most likely atelectasis.  Mild peribronchial thickening.  Heart is normal size. No effusions.  No acute bony abnormality.  IMPRESSION: Low lung volumes with bibasilar densities, likely atelectasis.  Mild bronchitic changes.   Original Report Authenticated By: Charlett Nose, M.D.      1. URI (upper respiratory infection)   2.  Pregnancy   3. Hypokalemia       MDM  Laura Combs presents with symptoms consistent with URI including tachycardia and shortness of breath.  Chest x-ray with Low lung volumes with bibasilar densities, likely atelectasis. Mild bronchitic changes.   CBC unremarkable and without leukocytosis, uric acid and LDH within normal limits, BMP with hypokalemia at 2.8, replaced here in the department. Hepatic function panel without elevated transaminases, urinalysis contaminated without evidence of urinary tract infection. Urine culture pending.  Patient with elevated d-dimer 1.03 but below the normal limit for pregnancy.  Do not suspect PE; patient has not been hypoxic. She denies hemoptysis.  Patient with low-grade fever at 99.2.  Patient evaluated by rapid OB RN and consult with OB on call for the [facility] outpatient clinic who are in agreement with the plan.  Pt given tylenol for fever control, first dose of azithromycin and NS 1L.    Dr. Ignacia Palma was consulted, evaluated this patient with me and agrees with the plan.    1. Medications: azithromycin, klor con, usual home medications 2. Treatment: rest, drink plenty of fluids, tylenol for fever control 3. Follow Up: Please followup with your primary doctor for discussion of your diagnoses and further evaluation after today's visit; f/u with the [facility] clinic on Monday for re-evaluation; return to the [facility] hospital emergency  room for further evaluation if your symptoms do not improve.    Dahlia Client Arnetta Odeh, PA-C 08/18/12 [phone]

## 2012-08-19 LAB — URINE CULTURE

## 2012-08-20 NOTE — ED Provider Notes (Signed)
Medical screening examination/treatment/procedure(s) were conducted as a shared visit with non-physician practitioner(s) and myself.  I personally evaluated the patient during the encounter 32 yo woman who is pregnant presents with URI symptoms.  She has a resting tachycardia, exam otherwise normal.  Seen by Rapid Response OB nurse.  Given IV fluids, symptomatic treatment, advised if further problems to go to [facility] MAU.   Carleene Cooper III, MD 08/20/12 [phone]

## 2012-08-23 ENCOUNTER — Encounter (HOSPITAL_COMMUNITY): Payer: Self-pay | Admitting: *Deleted

## 2012-08-23 ENCOUNTER — Inpatient Hospital Stay (HOSPITAL_COMMUNITY)
Admission: AD | Admit: 2012-08-23 | Discharge: 2012-08-23 | Disposition: A | Discharge status: Home / Self Care | Payer: BC Managed Care – PPO | Source: Ambulatory Visit | Attending: Obstetrics and Gynecology | Admitting: Obstetrics and Gynecology

## 2012-08-23 DIAGNOSIS — O139 Gestational [pregnancy-induced] hypertension without significant proteinuria, unspecified trimester: Secondary | ICD-10-CM | POA: Insufficient documentation

## 2012-08-23 LAB — CBC WITH DIFFERENTIAL/PLATELET
Basophils Relative: 0 % (ref 0–1)
Eosinophils Absolute: 0 10*3/uL (ref 0.0–0.7)
Eosinophils Relative: 0 % (ref 0–5)
HCT: 35.2 % — ABNORMAL LOW (ref 36.0–46.0)
Hemoglobin: 11.4 g/dL — ABNORMAL LOW (ref 12.0–15.0)
MCH: 27.5 pg (ref 26.0–34.0)
MCHC: 32.4 g/dL (ref 30.0–36.0)
MCV: 85 fL (ref 78.0–100.0)
Monocytes Absolute: 0.5 10*3/uL (ref 0.1–1.0)
Monocytes Relative: 5 % (ref 3–12)
Neutrophils Relative %: 67 % (ref 43–77)

## 2012-08-23 LAB — URINALYSIS, ROUTINE W REFLEX MICROSCOPIC
Ketones, ur: NEGATIVE mg/dL
Nitrite: NEGATIVE
Urobilinogen, UA: 1 mg/dL (ref 0.0–1.0)
pH: 6 (ref 5.0–8.0)

## 2012-08-23 LAB — COMPREHENSIVE METABOLIC PANEL
Albumin: 2.5 g/dL — ABNORMAL LOW (ref 3.5–5.2)
BUN: 5 mg/dL — ABNORMAL LOW (ref 6–23)
Calcium: 8.5 mg/dL (ref 8.4–10.5)
Creatinine, Ser: 0.61 mg/dL (ref 0.50–1.10)
GFR calc Af Amer: >90 mL/min (ref >90)
Total Protein: 5.9 g/dL — ABNORMAL LOW (ref 6.0–8.3)

## 2012-08-23 LAB — URINE MICROSCOPIC-ADD ON

## 2012-08-23 NOTE — MAU Provider Note (Signed)
History     CSN: [id number]  Arrival date and time: 08/23/12 1117   First Provider Initiated Contact with Patient 08/23/12 1317      Chief Complaint  Patient presents with  . Hypertension   HPI This is a 32 y.o. female at [redacted]w[redacted]d who presents from office for [facility] evaluation. She had elevated BPs in office, but denies symptoms of preeclampsia.   RN Note: Pt states sent from MD office for PIH eval, systolic bp 170, unsure of diastolic bp. Denies h/a, dizziness, blurred vision, or increased swelling to extremities.       OB History   Grav Para Term Preterm Abortions TAB SAB Ect Mult Living   2 0 0 0 1 0 1 0 0 0       History reviewed. No pertinent past medical history.  Past Surgical History  Procedure Laterality Date  . Dilation and evacuation  11/03/2011    Procedure: DILATATION AND EVACUATION;  Surgeon: Levi Aland, MD;  Location: [facility];  Service: Gynecology;  Laterality: N/A;    History reviewed. No pertinent family history.  History  Substance Use Topics  . Smoking status: Never Smoker   . Smokeless tobacco: Never Used  . Alcohol Use: No    Allergies: No Known Allergies  Prescriptions prior to admission  Medication Sig Dispense Refill  . acetaminophen (TYLENOL) 500 MG tablet Take 1,000 mg by mouth every 6 (six) hours as needed for pain.      [name] azithromycin (ZITHROMAX) 250 MG tablet Take 1 tablet (250 mg total) by mouth daily. Take first 2 tablets together, then 1 every day until finished.  6 tablet  0  . Phenylephrine-DM-GG-APAP (MUCINEX FAST-MAX COLD FLU PO) Take 20 mLs by mouth every 4 (four) hours as needed (for severe congestion).      . Prenatal Vit-Fe Fumarate-FA (PRENATAL MULTIVITAMIN) TABS Take 1 tablet by mouth daily.        Review of Systems  Constitutional: Negative for fever, chills and malaise/fatigue.  HENT: Positive for congestion and sore throat.   Eyes: Negative for blurred vision and double vision.  Respiratory: Positive for cough.         Laryngitis   Gastrointestinal: Negative for nausea, vomiting, abdominal pain, diarrhea and constipation.  Neurological: Negative for dizziness, sensory change, seizures, weakness and headaches.   Physical Exam   Blood pressure 158/61, pulse 111, height 5\' 3"  (1.6 m), weight 267 lb (121.11 kg), last menstrual period 01/31/2012.  Physical Exam  Constitutional: She appears well-developed and well-nourished. No distress.  HENT:  Head: Normocephalic.  Neck: Normal range of motion.  Cardiovascular: Normal rate.   Respiratory: Effort normal.  GI: Soft. There is no tenderness. There is no rebound and no guarding.  Gravid in contour No liver tenderness     MAU Course  Procedures  MDM Labs reviewed with Dr Claiborne Billings.  Results for orders placed during the hospital encounter of 08/23/12 (from the past 24 hour(s))  URINALYSIS, ROUTINE W REFLEX MICROSCOPIC     Status: Abnormal   Collection Time    08/23/12 11:31 AM      Result Value Range   Color, Urine YELLOW  YELLOW   APPearance HAZY (*) CLEAR   Specific Gravity, Urine 1.025  1.005 - 1.030   pH 6.0  5.0 - 8.0   Glucose, UA NEGATIVE  NEGATIVE mg/dL   Hgb urine dipstick NEGATIVE  NEGATIVE   Bilirubin Urine SMALL (*) NEGATIVE   Ketones, ur NEGATIVE  NEGATIVE mg/dL  Protein, ur 30 (*) NEGATIVE mg/dL   Urobilinogen, UA 1.0  0.0 - 1.0 mg/dL   Nitrite NEGATIVE  NEGATIVE   Leukocytes, UA TRACE (*) NEGATIVE  URINE MICROSCOPIC-ADD ON     Status: Abnormal   Collection Time    08/23/12 11:31 AM      Result Value Range   Squamous Epithelial / LPF MANY (*) RARE   WBC, UA 7-10  <3 WBC/hpf   RBC / HPF 0-2  <3 RBC/hpf   Bacteria, UA FEW (*) RARE   Urine-Other MUCOUS PRESENT    CBC WITH DIFFERENTIAL     Status: Abnormal   Collection Time    08/23/12 12:50 PM      Result Value Range   WBC 10.0  4.0 - 10.5 K/uL   RBC 4.14  3.87 - 5.11 MIL/uL   Hemoglobin 11.4 (*) 12.0 - 15.0 g/dL   HCT 29.5 (*) 62.1 - 30.8 %   MCV 85.0  78.0 - 100.0 fL    MCH 27.5  26.0 - 34.0 pg   MCHC 32.4  30.0 - 36.0 g/dL   RDW 65.7  84.6 - 96.2 %   Platelets 208  150 - 400 K/uL   Neutrophils Relative 67  43 - 77 %   Neutro Abs 6.7  1.7 - 7.7 K/uL   Lymphocytes Relative 27  12 - 46 %   Lymphs Abs 2.7  0.7 - 4.0 K/uL   Monocytes Relative 5  3 - 12 %   Monocytes Absolute 0.5  0.1 - 1.0 K/uL   Eosinophils Relative 0  0 - 5 %   Eosinophils Absolute 0.0  0.0 - 0.7 K/uL   Basophils Relative 0  0 - 1 %   Basophils Absolute 0.0  0.0 - 0.1 K/uL  COMPREHENSIVE METABOLIC PANEL     Status: Abnormal   Collection Time    08/23/12 12:50 PM      Result Value Range   Sodium 138  135 - 145 mEq/L   Potassium 3.3 (*) 3.5 - 5.1 mEq/L   Chloride 106  96 - 112 mEq/L   CO2 22  19 - 32 mEq/L   Glucose, Bld 107 (*) 70 - 99 mg/dL   BUN 5 (*) 6 - 23 mg/dL   Creatinine, Ser 9.52  0.50 - 1.10 mg/dL   Calcium 8.5  8.4 - 84.1 mg/dL   Total Protein 5.9 (*) 6.0 - 8.3 g/dL   Albumin 2.5 (*) 3.5 - 5.2 g/dL   AST 35  0 - 37 U/L   ALT 18  0 - 35 U/L   Alkaline Phosphatase 129 (*) 39 - 117 U/L   Total Bilirubin 0.3  0.3 - 1.2 mg/dL   GFR calc non Af Amer >90  >90 mL/min   GFR calc Af Amer >90  >90 mL/min   Filed Vitals:   08/23/12 1346 08/23/12 1401 08/23/12 1416 08/23/12 1424  BP: 132/82 140/82 132/78 132/78  Pulse: 95 93 99 99  Resp:    18  Height:      Weight:         Assessment and Plan  A:  SIUP at [redacted]w[redacted]d       Gestational Hypertension      No evidence of Preeclampsia  P:  Discharge home      Will start 24 hr urine collection.  Pt is to call office tomorrow to see if she is tobring it back to office or here  Preeclampsia precautions reviewed in detail.       Follow up PRN  [employer] 08/23/2012, 1:32 PM

## 2012-08-23 NOTE — MAU Note (Signed)
Pt states sent from MD office for PIH eval, systolic bp 170, unsure of diastolic bp. Denies h/a, dizziness, blurred vision, or increased swelling to extremities.

## 2012-08-24 LAB — URINE CULTURE

## 2012-08-30 ENCOUNTER — Ambulatory Visit (HOSPITAL_COMMUNITY): Payer: BC Managed Care – PPO

## 2012-09-28 ENCOUNTER — Inpatient Hospital Stay (HOSPITAL_COMMUNITY)
Admission: AD | Admit: 2012-09-28 | Discharge: 2012-09-28 | Disposition: A | Discharge status: Home / Self Care | Payer: BC Managed Care – PPO | Source: Ambulatory Visit | Attending: Obstetrics and Gynecology | Admitting: Obstetrics and Gynecology

## 2012-09-28 ENCOUNTER — Encounter (HOSPITAL_COMMUNITY): Payer: Self-pay | Admitting: *Deleted

## 2012-09-28 DIAGNOSIS — O163 Unspecified maternal hypertension, third trimester: Secondary | ICD-10-CM

## 2012-09-28 DIAGNOSIS — O139 Gestational [pregnancy-induced] hypertension without significant proteinuria, unspecified trimester: Secondary | ICD-10-CM

## 2012-09-28 HISTORY — DX: Essential (primary) hypertension: I10

## 2012-09-28 HISTORY — DX: Headache: R51

## 2012-09-28 LAB — URINALYSIS, ROUTINE W REFLEX MICROSCOPIC
Bilirubin Urine: NEGATIVE
Ketones, ur: 15 mg/dL — AB
Nitrite: NEGATIVE
Urobilinogen, UA: 0.2 mg/dL (ref 0.0–1.0)
pH: 6 (ref 5.0–8.0)

## 2012-09-28 LAB — COMPREHENSIVE METABOLIC PANEL
ALT: 8 U/L (ref 0–35)
CO2: 19 mEq/L (ref 19–32)
Calcium: 9 mg/dL (ref 8.4–10.5)
Creatinine, Ser: 0.54 mg/dL (ref 0.50–1.10)
GFR calc Af Amer: >90 mL/min (ref >90)
GFR calc non Af Amer: >90 mL/min (ref >90)
Glucose, Bld: 78 mg/dL (ref 70–99)
Sodium: 138 mEq/L (ref 135–145)
Total Protein: 6.3 g/dL (ref 6.0–8.3)

## 2012-09-28 LAB — CBC
Hemoglobin: 11.4 g/dL — ABNORMAL LOW (ref 12.0–15.0)
MCH: 26.8 pg (ref 26.0–34.0)
MCHC: 32.4 g/dL (ref 30.0–36.0)
MCV: 82.6 fL (ref 78.0–100.0)
RBC: 4.26 MIL/uL (ref 3.87–5.11)

## 2012-09-28 LAB — URINE MICROSCOPIC-ADD ON

## 2012-09-28 LAB — URIC ACID: Uric Acid, Serum: 5.2 mg/dL (ref 2.4–7.0)

## 2012-09-28 LAB — LACTATE DEHYDROGENASE: LDH: 338 U/L — ABNORMAL HIGH (ref 94–250)

## 2012-09-28 NOTE — MAU Note (Signed)
Sent from office for further eval. On going BP elevation with this preg.  Slight HA, though has hx of migraines.  Denies visual changes or epigastric pain.

## 2012-09-28 NOTE — MAU Provider Note (Signed)
History     CSN: [id number]  Arrival date and time: 09/28/12 1220   First Provider Initiated Contact with Patient 09/28/12 1310      Chief Complaint  Patient presents with  . PIH eval    HPI Ms. Laura Combs is a 32 y.o. G3P1011 at [redacted]w[redacted]d who was sent from the office to MAU for evaluation of elevated BP. The patient states that her BP in the office was 130s/90s today and 150s/90s Tuesday. This has been an ongoing issues throughout her pregnancy. She does not have issues with HTN outside of pregnancy. She was on medication for HTN for a few months after her last child was born, but has otherwise not had any issues. Her last child was induced because of her BP. She had a headache yesterday that she feels was more like her normal migraines. She has a slight headache today. No vision changes or blurred vision. She denies abdominal pain, peripheral edema or contractions. She reports good fetal movement.    OB History   Grav Para Term Preterm Abortions TAB SAB Ect Mult Living   3 1 1  0 1 0 1 0 0 1      Past Medical History  Diagnosis Date  . Hypertension   . Headache     Past Surgical History  Procedure Laterality Date  . Dilation and evacuation  11/03/2011    Procedure: DILATATION AND EVACUATION;  Surgeon: Levi Aland, MD;  Location: [facility];  Service: Gynecology;  Laterality: N/A;    History reviewed. No pertinent family history.  History  Substance Use Topics  . Smoking status: Never Smoker   . Smokeless tobacco: Never Used  . Alcohol Use: No    Allergies: No Known Allergies  Prescriptions prior to admission  Medication Sig Dispense Refill  . Prenatal Vit-Fe Fumarate-FA (PRENATAL MULTIVITAMIN) TABS Take 1 tablet by mouth daily.        Review of Systems  Constitutional: Negative for fever.  Eyes: Negative for blurred vision and double vision.  Gastrointestinal: Negative for nausea, vomiting and abdominal pain.  Genitourinary: Negative for dysuria, urgency and  frequency.       Neg - vaginal bleeding Neg - abnormal discharge, LOF  Musculoskeletal:       Neg - peripheral edema  Neurological: Negative for dizziness.   Physical Exam   Blood pressure 125/86, pulse 102, temperature 98.2 F (36.8 C), last menstrual period 01/31/2012.  Physical Exam  Constitutional: She is oriented to person, place, and time. She appears well-developed and well-nourished. No distress.  HENT:  Head: Normocephalic and atraumatic.  Cardiovascular: Normal rate, regular rhythm and normal heart sounds.   Respiratory: Effort normal and breath sounds normal. No respiratory distress.  GI: Soft. Bowel sounds are normal. She exhibits no distension and no mass. There is no tenderness. There is no rebound and no guarding.  Musculoskeletal: Normal range of motion. She exhibits no edema and no tenderness.  No clonus  Neurological: She is alert and oriented to person, place, and time. She has normal reflexes.  Skin: Skin is warm and dry. No erythema.  Psychiatric: She has a normal mood and affect.   Results for orders placed during the hospital encounter of 09/28/12 (from the past 24 hour(s))  URINALYSIS, ROUTINE W REFLEX MICROSCOPIC     Status: Abnormal   Collection Time    09/28/12 12:25 PM      Result Value Range   Color, Urine YELLOW  YELLOW   APPearance HAZY (*)  CLEAR   Specific Gravity, Urine 1.020  1.005 - 1.030   pH 6.0  5.0 - 8.0   Glucose, UA NEGATIVE  NEGATIVE mg/dL   Hgb urine dipstick TRACE (*) NEGATIVE   Bilirubin Urine NEGATIVE  NEGATIVE   Ketones, ur 15 (*) NEGATIVE mg/dL   Protein, ur NEGATIVE  NEGATIVE mg/dL   Urobilinogen, UA 0.2  0.0 - 1.0 mg/dL   Nitrite NEGATIVE  NEGATIVE   Leukocytes, UA MODERATE (*) NEGATIVE  URINE MICROSCOPIC-ADD ON     Status: Abnormal   Collection Time    09/28/12 12:25 PM      Result Value Range   Squamous Epithelial / LPF MANY (*) RARE   WBC, UA 11-20  <3 WBC/hpf   RBC / HPF 0-2  <3 RBC/hpf   Bacteria, UA MANY (*) RARE   CBC     Status: Abnormal   Collection Time    09/28/12 12:40 PM      Result Value Range   WBC 13.7 (*) 4.0 - 10.5 K/uL   RBC 4.26  3.87 - 5.11 MIL/uL   Hemoglobin 11.4 (*) 12.0 - 15.0 g/dL   HCT 16.1 (*) 09.6 - 04.5 %   MCV 82.6  78.0 - 100.0 fL   MCH 26.8  26.0 - 34.0 pg   MCHC 32.4  30.0 - 36.0 g/dL   RDW 40.9  81.1 - 91.4 %   Platelets 237  150 - 400 K/uL  COMPREHENSIVE METABOLIC PANEL     Status: Abnormal   Collection Time    09/28/12 12:40 PM      Result Value Range   Sodium 138  135 - 145 mEq/L   Potassium 4.1  3.5 - 5.1 mEq/L   Chloride 104  96 - 112 mEq/L   CO2 19  19 - 32 mEq/L   Glucose, Bld 78  70 - 99 mg/dL   BUN 5 (*) 6 - 23 mg/dL   Creatinine, Ser 7.82  0.50 - 1.10 mg/dL   Calcium 9.0  8.4 - 95.6 mg/dL   Total Protein 6.3  6.0 - 8.3 g/dL   Albumin 2.8 (*) 3.5 - 5.2 g/dL   AST 22  0 - 37 U/L   ALT 8  0 - 35 U/L   Alkaline Phosphatase 128 (*) 39 - 117 U/L   Total Bilirubin 0.4  0.3 - 1.2 mg/dL   GFR calc non Af Amer >90  >90 mL/min   GFR calc Af Amer >90  >90 mL/min  LACTATE DEHYDROGENASE     Status: Abnormal   Collection Time    09/28/12 12:40 PM      Result Value Range   LDH 338 (*) 94 - 250 U/L  URIC ACID     Status: None   Collection Time    09/28/12 12:40 PM      Result Value Range   Uric Acid, Serum 5.2  2.4 - 7.0 mg/dL   Fetal Monitoring: Baseline: 130 bpm, moderate variability, + accelerations, no decelerations Contractions: none  MAU Course  Procedures None  MDM Sent from office. Dr. Dareen Piano called orders to MAU.  Discussed lab results and serial BPs with Dr. Dareen Piano. He would like to discharge the patient with instructions to follow-up in the office Wednesday or Thursday of next week   Assessment and Plan  A: Hypertension in pregnancy  P: Discharge home Hypertension/Pre-eclampsia warning signs discussed Labor precautions discussed Patient instructed to call the office Monday to schedule follow-up on Wednesday or  Thursday of  next week Patient may return to MAU as needed or if her condition were to change or worsen  Freddi Starr, PA-C  09/28/2012, 1:47 PM

## 2012-09-29 LAB — URINE CULTURE: Colony Count: 40000

## 2012-10-17 LAB — OB RESULTS CONSOLE GBS: GBS: POSITIVE

## 2012-10-29 ENCOUNTER — Telehealth (HOSPITAL_COMMUNITY): Payer: Self-pay | Admitting: *Deleted

## 2012-10-29 ENCOUNTER — Encounter (HOSPITAL_COMMUNITY): Payer: Self-pay | Admitting: *Deleted

## 2012-10-29 NOTE — Telephone Encounter (Signed)
Preadmission screen  

## 2012-10-30 ENCOUNTER — Telehealth (HOSPITAL_COMMUNITY): Payer: Self-pay | Admitting: *Deleted

## 2012-10-30 ENCOUNTER — Encounter (HOSPITAL_COMMUNITY): Payer: Self-pay | Admitting: *Deleted

## 2012-10-30 NOTE — Telephone Encounter (Signed)
Preadmission screen  

## 2012-10-31 ENCOUNTER — Inpatient Hospital Stay (HOSPITAL_COMMUNITY)
Admission: AD | Admit: 2012-10-31 | Discharge: 2012-11-03 | DRG: 372 | Disposition: A | Discharge status: Home / Self Care | Payer: BC Managed Care – PPO | Source: Ambulatory Visit | Attending: Obstetrics and Gynecology | Admitting: Obstetrics and Gynecology

## 2012-10-31 ENCOUNTER — Encounter (HOSPITAL_COMMUNITY): Payer: Self-pay | Admitting: *Deleted

## 2012-10-31 DIAGNOSIS — E669 Obesity, unspecified: Secondary | ICD-10-CM | POA: Diagnosis present

## 2012-10-31 DIAGNOSIS — IMO0002 Reserved for concepts with insufficient information to code with codable children: Principal | ICD-10-CM | POA: Diagnosis present

## 2012-10-31 DIAGNOSIS — I10 Essential (primary) hypertension: Secondary | ICD-10-CM | POA: Diagnosis present

## 2012-10-31 LAB — CBC
MCH: 25.3 pg — ABNORMAL LOW (ref 26.0–34.0)
MCHC: 31.5 g/dL (ref 30.0–36.0)
MCV: 80.3 fL (ref 78.0–100.0)
Platelets: 220 10*3/uL (ref 150–400)

## 2012-10-31 MED ORDER — MISOPROSTOL 25 MCG QUARTER TABLET
25.0000 ug | ORAL_TABLET | ORAL | Status: DC | PRN
  Administered 2012-10-31 – 2012-11-01 (×2): 25 ug via VAGINAL
  Filled 2012-10-31: qty 1
  Filled 2012-10-31 (×2): qty 0.25

## 2012-10-31 MED ORDER — ACETAMINOPHEN 325 MG PO TABS
650.0000 mg | ORAL_TABLET | ORAL | Status: DC | PRN
  Administered 2012-11-01: 650 mg via ORAL
  Filled 2012-10-31: qty 2

## 2012-10-31 MED ORDER — ZOLPIDEM TARTRATE 5 MG PO TABS
5.0000 mg | ORAL_TABLET | Freq: Every evening | ORAL | Status: DC | PRN
  Administered 2012-11-01: 5 mg via ORAL
  Filled 2012-10-31: qty 1

## 2012-10-31 MED ORDER — LIDOCAINE HCL (PF) 1 % IJ SOLN
30.0000 mL | INTRAMUSCULAR | Status: DC | PRN
  Filled 2012-10-31: qty 30

## 2012-10-31 MED ORDER — PENICILLIN G POTASSIUM 5000000 UNITS IJ SOLR
5.0000 10*6.[IU] | Freq: Once | INTRAVENOUS | Status: AC
  Administered 2012-11-01: 5 10*6.[IU] via INTRAVENOUS
  Filled 2012-10-31: qty 5

## 2012-10-31 MED ORDER — IBUPROFEN 600 MG PO TABS: 600.0000 mg | ORAL_TABLET | Freq: Four times a day (QID) | ORAL | Status: DC | PRN

## 2012-10-31 MED ORDER — CITRIC ACID-SODIUM CITRATE 334-500 MG/5ML PO SOLN: 30.0000 mL | ORAL | Status: DC | PRN

## 2012-10-31 MED ORDER — LACTATED RINGERS IV SOLN: 500.0000 mL | INTRAVENOUS | Status: DC | PRN

## 2012-10-31 MED ORDER — OXYTOCIN 40 UNITS IN LACTATED RINGERS INFUSION - SIMPLE MED
62.5000 mL/h | INTRAVENOUS | Status: DC
  Administered 2012-11-01: 62.5 mL/h via INTRAVENOUS
  Filled 2012-10-31: qty 1000

## 2012-10-31 MED ORDER — PENICILLIN G POTASSIUM 5000000 UNITS IJ SOLR
2.5000 10*6.[IU] | INTRAVENOUS | Status: DC
  Administered 2012-11-01 (×3): 2.5 10*6.[IU] via INTRAVENOUS
  Filled 2012-10-31 (×5): qty 2.5

## 2012-10-31 MED ORDER — TERBUTALINE SULFATE 1 MG/ML IJ SOLN: 0.2500 mg | Freq: Once | INTRAMUSCULAR | Status: AC | PRN

## 2012-10-31 MED ORDER — OXYTOCIN BOLUS FROM INFUSION
500.0000 mL | INTRAVENOUS | Status: DC
  Administered 2012-11-01: 500 mL via INTRAVENOUS

## 2012-10-31 MED ORDER — BUTORPHANOL TARTRATE 1 MG/ML IJ SOLN: 1.0000 mg | INTRAMUSCULAR | Status: DC | PRN

## 2012-10-31 MED ORDER — LACTATED RINGERS IV SOLN
INTRAVENOUS | Status: DC
  Administered 2012-11-01: 18:00:00 via INTRAVENOUS

## 2012-10-31 MED ORDER — ONDANSETRON HCL 4 MG/2ML IJ SOLN
4.0000 mg | Freq: Four times a day (QID) | INTRAMUSCULAR | Status: DC | PRN
  Administered 2012-11-01: 4 mg via INTRAVENOUS
  Filled 2012-10-31: qty 2

## 2012-10-31 MED ORDER — OXYCODONE-ACETAMINOPHEN 5-325 MG PO TABS: 1.0000 | ORAL_TABLET | ORAL | Status: DC | PRN

## 2012-10-31 NOTE — Progress Notes (Signed)
Called Dr. Dareen Piano and notified of pt arrival.  Orders for cytotec through the night, Ambien if needed and BP parameters of call if > 160/95.

## 2012-11-01 ENCOUNTER — Encounter (HOSPITAL_COMMUNITY): Payer: Self-pay | Admitting: Anesthesiology

## 2012-11-01 ENCOUNTER — Inpatient Hospital Stay (HOSPITAL_COMMUNITY): Payer: BC Managed Care – PPO | Admitting: Anesthesiology

## 2012-11-01 ENCOUNTER — Encounter (HOSPITAL_COMMUNITY): Payer: Self-pay | Admitting: *Deleted

## 2012-11-01 LAB — CBC
Hemoglobin: 10.1 g/dL — ABNORMAL LOW (ref 12.0–15.0)
MCH: 25.3 pg — ABNORMAL LOW (ref 26.0–34.0)
MCV: 80.3 fL (ref 78.0–100.0)
RBC: 4 MIL/uL (ref 3.87–5.11)
WBC: 14 10*3/uL — ABNORMAL HIGH (ref 4.0–10.5)

## 2012-11-01 LAB — RPR: RPR Ser Ql: NONREACTIVE

## 2012-11-01 MED ORDER — FENTANYL 2.5 MCG/ML BUPIVACAINE 1/10 % EPIDURAL INFUSION (WH - ANES)
14.0000 mL/h | INTRAMUSCULAR | Status: DC | PRN
  Administered 2012-11-01 (×2): 14 mL/h via EPIDURAL
  Filled 2012-11-01 (×3): qty 125

## 2012-11-01 MED ORDER — TERBUTALINE SULFATE 1 MG/ML IJ SOLN: 0.2500 mg | Freq: Once | INTRAMUSCULAR | Status: AC | PRN

## 2012-11-01 MED ORDER — EPHEDRINE 5 MG/ML INJ
10.0000 mg | INTRAVENOUS | Status: DC | PRN
  Filled 2012-11-01: qty 2

## 2012-11-01 MED ORDER — PHENYLEPHRINE 40 MCG/ML (10ML) SYRINGE FOR IV PUSH (FOR BLOOD PRESSURE SUPPORT)
80.0000 ug | PREFILLED_SYRINGE | INTRAVENOUS | Status: DC | PRN
  Filled 2012-11-01: qty 5
  Filled 2012-11-01: qty 2

## 2012-11-01 MED ORDER — OXYTOCIN 40 UNITS IN LACTATED RINGERS INFUSION - SIMPLE MED
1.0000 m[IU]/min | INTRAVENOUS | Status: DC
  Administered 2012-11-01: 2 m[IU]/min via INTRAVENOUS
  Administered 2012-11-01: 4 m[IU]/min via INTRAVENOUS

## 2012-11-01 MED ORDER — PHENYLEPHRINE 40 MCG/ML (10ML) SYRINGE FOR IV PUSH (FOR BLOOD PRESSURE SUPPORT)
80.0000 ug | PREFILLED_SYRINGE | INTRAVENOUS | Status: DC | PRN
  Administered 2012-11-01: 80 ug via INTRAVENOUS
  Filled 2012-11-01: qty 2

## 2012-11-01 MED ORDER — EPHEDRINE 5 MG/ML INJ
10.0000 mg | INTRAVENOUS | Status: DC | PRN
  Filled 2012-11-01: qty 4
  Filled 2012-11-01: qty 2

## 2012-11-01 MED ORDER — LACTATED RINGERS IV SOLN
500.0000 mL | Freq: Once | INTRAVENOUS | Status: AC
  Administered 2012-11-01: 500 mL via INTRAVENOUS

## 2012-11-01 MED ORDER — AMPICILLIN SODIUM 2 G IJ SOLR
2.0000 g | Freq: Four times a day (QID) | INTRAMUSCULAR | Status: DC
  Administered 2012-11-01: 2 g via INTRAVENOUS
  Filled 2012-11-01 (×4): qty 2000

## 2012-11-01 MED ORDER — DIPHENHYDRAMINE HCL 50 MG/ML IJ SOLN: 12.5000 mg | INTRAMUSCULAR | Status: DC | PRN

## 2012-11-01 MED ORDER — LIDOCAINE HCL (PF) 1 % IJ SOLN
INTRAMUSCULAR | Status: DC | PRN
  Administered 2012-11-01 (×2): 5 mL

## 2012-11-01 NOTE — Anesthesia Procedure Notes (Signed)

## 2012-11-01 NOTE — Progress Notes (Signed)
Pt now 3cm. Having difficulty tracing ctxs. IUPC placed.

## 2012-11-01 NOTE — Anesthesia Preprocedure Evaluation (Signed)
Anesthesia Evaluation  Patient identified by MRN, date of birth, ID band Patient awake    Reviewed: Allergy & Precautions, H&P , Patient's Chart, lab work & pertinent test results  Airway Mallampati: IV TM Distance: >3 FB Neck ROM: full    Dental no notable dental hx.    Pulmonary neg pulmonary ROS,  breath sounds clear to auscultation  Pulmonary exam normal       Cardiovascular hypertension, negative cardio ROS  Rhythm:regular Rate:Normal     Neuro/Psych  Headaches, negative neurological ROS  negative psych ROS   GI/Hepatic negative GI ROS, Neg liver ROS,   Endo/Other  negative endocrine ROSMorbid obesity  Renal/GU negative Renal ROS     Musculoskeletal   Abdominal   Peds  Hematology negative hematology ROS (+)   Anesthesia Other Findings   Reproductive/Obstetrics (+) Pregnancy                           Anesthesia Physical Anesthesia Plan  ASA: III  Anesthesia Plan: Epidural   Post-op Pain Management:    Induction:   Airway Management Planned:   Additional Equipment:   Intra-op Plan:   Post-operative Plan:   Informed Consent: I have reviewed the patients History and Physical, chart, labs and discussed the procedure including the risks, benefits and alternatives for the proposed anesthesia with the patient or authorized representative who has indicated his/her understanding and acceptance.     Plan Discussed with:   Anesthesia Plan Comments:         Anesthesia Quick Evaluation

## 2012-11-01 NOTE — Progress Notes (Signed)
Pt had temp to 100.2- amp started and tylenol given. FHTs 150s gstv, NST R, early decels. Will check cervix.

## 2012-11-01 NOTE — H&P (Signed)
Pt is a 32 yr old white female, G3P1011 at term who is admitted for an induction secondary to chronic hypertension whit superimposed preeclampsia. Pt had a B/P in the office yesterday of 165/96.,1+proteinuria. BPP 8/8. PMHx: See Catha Gosselin PE: Morbidly obese,White female in NAD.        HEENT- wnl        Abd- obese, gravid        Cx20/1/-2 vtx FHTs - reactive IMP/ IUP at term with Chronic HTN and superimposed preeclampsia PLAN/ Start induction

## 2012-11-02 LAB — CBC
Platelets: 231 10*3/uL (ref 150–400)
RBC: 4.14 MIL/uL (ref 3.87–5.11)
RDW: 15 % (ref 11.5–15.5)
WBC: 27.1 10*3/uL — ABNORMAL HIGH (ref 4.0–10.5)

## 2012-11-02 MED ORDER — DIBUCAINE 1 % RE OINT: 1.0000 "application " | TOPICAL_OINTMENT | RECTAL | Status: DC | PRN

## 2012-11-02 MED ORDER — BENZOCAINE-MENTHOL 20-0.5 % EX AERO
1.0000 "application " | INHALATION_SPRAY | CUTANEOUS | Status: DC | PRN
  Administered 2012-11-02: 1 via TOPICAL
  Filled 2012-11-02: qty 56

## 2012-11-02 MED ORDER — IBUPROFEN 800 MG PO TABS
800.0000 mg | ORAL_TABLET | Freq: Three times a day (TID) | ORAL | Status: DC
  Administered 2012-11-02 – 2012-11-03 (×4): 800 mg via ORAL
  Filled 2012-11-02 (×4): qty 1

## 2012-11-02 MED ORDER — SIMETHICONE 80 MG PO CHEW: 80.0000 mg | CHEWABLE_TABLET | ORAL | Status: DC | PRN

## 2012-11-02 MED ORDER — SODIUM CHLORIDE 0.9 % IJ SOLN: 3.0000 mL | Freq: Two times a day (BID) | INTRAMUSCULAR | Status: DC

## 2012-11-02 MED ORDER — FERROUS SULFATE 325 (65 FE) MG PO TABS
325.0000 mg | ORAL_TABLET | Freq: Two times a day (BID) | ORAL | Status: DC
  Administered 2012-11-02 – 2012-11-03 (×3): 325 mg via ORAL
  Filled 2012-11-02 (×3): qty 1

## 2012-11-02 MED ORDER — PRENATAL MULTIVITAMIN CH
1.0000 | ORAL_TABLET | Freq: Every day | ORAL | Status: DC
  Administered 2012-11-02: 1 via ORAL
  Filled 2012-11-02: qty 1

## 2012-11-02 MED ORDER — TETANUS-DIPHTH-ACELL PERTUSSIS 5-2.5-18.5 LF-MCG/0.5 IM SUSP: 0.5000 mL | Freq: Once | INTRAMUSCULAR | Status: DC

## 2012-11-02 MED ORDER — SODIUM CHLORIDE 0.9 % IJ SOLN: 3.0000 mL | INTRAMUSCULAR | Status: DC | PRN

## 2012-11-02 MED ORDER — DIPHENHYDRAMINE HCL 25 MG PO CAPS: 25.0000 mg | ORAL_CAPSULE | Freq: Four times a day (QID) | ORAL | Status: DC | PRN

## 2012-11-02 MED ORDER — SENNOSIDES-DOCUSATE SODIUM 8.6-50 MG PO TABS
2.0000 | ORAL_TABLET | Freq: Every day | ORAL | Status: DC
  Administered 2012-11-02: 2 via ORAL

## 2012-11-02 MED ORDER — OXYCODONE-ACETAMINOPHEN 5-325 MG PO TABS
1.0000 | ORAL_TABLET | ORAL | Status: DC | PRN
  Administered 2012-11-02 (×2): 1 via ORAL
  Filled 2012-11-02 (×2): qty 1

## 2012-11-02 MED ORDER — MAGNESIUM HYDROXIDE 400 MG/5ML PO SUSP: 30.0000 mL | ORAL | Status: DC | PRN

## 2012-11-02 MED ORDER — ZOLPIDEM TARTRATE 5 MG PO TABS: 5.0000 mg | ORAL_TABLET | Freq: Every evening | ORAL | Status: DC | PRN

## 2012-11-02 MED ORDER — METHYLERGONOVINE MALEATE 0.2 MG PO TABS: 0.2000 mg | ORAL_TABLET | ORAL | Status: DC | PRN

## 2012-11-02 MED ORDER — MEASLES, MUMPS & RUBELLA VAC ~~LOC~~ INJ
0.5000 mL | INJECTION | Freq: Once | SUBCUTANEOUS | Status: DC
  Filled 2012-11-02: qty 0.5

## 2012-11-02 MED ORDER — ONDANSETRON HCL 4 MG/2ML IJ SOLN: 4.0000 mg | INTRAMUSCULAR | Status: DC | PRN

## 2012-11-02 MED ORDER — LANOLIN HYDROUS EX OINT: TOPICAL_OINTMENT | CUTANEOUS | Status: DC | PRN

## 2012-11-02 MED ORDER — WITCH HAZEL-GLYCERIN EX PADS: 1.0000 "application " | MEDICATED_PAD | CUTANEOUS | Status: DC | PRN

## 2012-11-02 MED ORDER — METHYLERGONOVINE MALEATE 0.2 MG/ML IJ SOLN: 0.2000 mg | INTRAMUSCULAR | Status: DC | PRN

## 2012-11-02 MED ORDER — ONDANSETRON HCL 4 MG PO TABS: 4.0000 mg | ORAL_TABLET | ORAL | Status: DC | PRN

## 2012-11-02 MED ORDER — SODIUM CHLORIDE 0.9 % IV SOLN: 250.0000 mL | INTRAVENOUS | Status: DC | PRN

## 2012-11-02 NOTE — Progress Notes (Signed)
Post Partum Day 1 Subjective: no complaints, up ad lib, voiding and tolerating PO  Objective: Blood pressure 136/77, pulse 115, temperature 98.4 F (36.9 C), temperature source Oral, resp. rate 18, height 5\' 3"  (1.6 m), weight 121.564 kg (268 lb), last menstrual period 01/31/2012, unknown if currently breastfeeding.  Physical Exam:  General: alert, cooperative and appears stated age Lochia: appropriate Uterine Fundus: firm   Recent Labs  11/01/12 0745 11/02/12 0135  HGB 10.1* 10.3*  HCT 32.1* 33.1*    Assessment/Plan: Plan for discharge tomorrow and Breastfeeding   LOS: 2 days   Laura Combs H. 11/02/2012, 8:34 AM

## 2012-11-02 NOTE — Anesthesia Postprocedure Evaluation (Signed)
  Anesthesia Post-op Note  Patient: Laura Combs  Procedure(s) Performed: * No procedures listed *  Patient Location: PACU and Mother/Baby  Anesthesia Type:Epidural  Level of Consciousness: awake, alert  and oriented  Airway and Oxygen Therapy: Patient Spontanous Breathing  Post-op Pain: none  Post-op Assessment: Post-op Vital signs reviewed, Patient's Cardiovascular Status Stable, No headache, No backache, No residual numbness and No residual motor weakness  Post-op Vital Signs: Reviewed and stable  Complications: No apparent anesthesia complications

## 2012-11-03 MED ORDER — IBUPROFEN 600 MG PO TABS: 600.0000 mg | ORAL_TABLET | Freq: Four times a day (QID) | ORAL | Status: DC | PRN

## 2012-11-03 MED ORDER — OXYCODONE-ACETAMINOPHEN 5-325 MG PO TABS: 2.0000 | ORAL_TABLET | ORAL | Status: DC | PRN

## 2012-11-03 MED ORDER — DOCUSATE SODIUM 100 MG PO CAPS: 100.0000 mg | ORAL_CAPSULE | Freq: Two times a day (BID) | ORAL | Status: DC

## 2012-11-03 NOTE — Discharge Summary (Signed)
Obstetric Discharge Summary Reason for Admission: IOL Prenatal Procedures: NST and ultrasound Intrapartum Procedures: spontaneous vaginal delivery Postpartum Procedures: none Complications-Operative and Postpartum: none Hemoglobin  Date Value Range Status  11/02/2012 10.3* 12.0 - 15.0 g/dL Final     HCT  Date Value Range Status  11/02/2012 33.1* 36.0 - 46.0 % Final    Physical Exam:  General: alert, cooperative and appears stated age 32: appropriate Uterine Fundus: firm  Discharge Diagnoses: Term Pregnancy-delivered and Mild Pre-eclampsia  Discharge Information: Date: 11/03/2012 Activity: pelvic rest Diet: routine Medications: Ibuprofen, Colace and Percocet Condition: improved Instructions: refer to practice specific booklet Discharge to: home Follow-up Information   Follow up with HORVATH,MICHELLE A, MD In 4 weeks. (For a postpartum evaluation)    Contact information:   [address] [place] [address] [phone]       Newborn Data: Live born female  Birth Weight: 7 lb 8 oz (3402 g) APGAR: 9, 9  Home with mother.  Laura Combs H. 11/03/2012, 11:14 AM

## 2012-11-05 NOTE — Progress Notes (Signed)
Post discharge chart review completed.  

## 2013-06-06 ENCOUNTER — Encounter: Payer: Self-pay | Admitting: Nurse Practitioner

## 2013-06-06 ENCOUNTER — Ambulatory Visit (INDEPENDENT_AMBULATORY_CARE_PROVIDER_SITE_OTHER): Payer: BC Managed Care – PPO | Admitting: Nurse Practitioner

## 2013-06-06 ENCOUNTER — Encounter (INDEPENDENT_AMBULATORY_CARE_PROVIDER_SITE_OTHER): Payer: Self-pay

## 2013-06-06 VITALS — BP 149/99 | HR 90 | Temp 97.6°F | Ht 63.0 in | Wt 250.0 lb

## 2013-06-06 DIAGNOSIS — M545 Low back pain: Secondary | ICD-10-CM

## 2013-06-06 MED ORDER — KETOROLAC TROMETHAMINE 60 MG/2ML IM SOLN
60.0000 mg | Freq: Once | INTRAMUSCULAR | Status: AC
  Administered 2013-06-06: 60 mg via INTRAMUSCULAR

## 2013-06-06 MED ORDER — PREDNISONE 20 MG PO TABS: ORAL_TABLET | ORAL | Status: DC

## 2013-06-06 MED ORDER — CYCLOBENZAPRINE HCL 5 MG PO TABS: 5.0000 mg | ORAL_TABLET | Freq: Three times a day (TID) | ORAL | Status: DC | PRN

## 2013-06-06 NOTE — Patient Instructions (Signed)
Back Pain, Adult Low back pain is very common. About 1 in 5 people have back pain.The cause of low back pain is rarely dangerous. The pain often gets better over time.About half of people with a sudden onset of back pain feel better in just 2 weeks. About 8 in 10 people feel better by 6 weeks.  CAUSES Some common causes of back pain include:  Strain of the muscles or ligaments supporting the spine.  Wear and tear (degeneration) of the spinal discs.  Arthritis.  Direct injury to the back. DIAGNOSIS Most of the time, the direct cause of low back pain is not known.However, back pain can be treated effectively even when the exact cause of the pain is unknown.Answering your caregiver's questions about your overall health and symptoms is one of the most accurate ways to make sure the cause of your pain is not dangerous. If your caregiver needs more information, he or she may order lab work or imaging tests (X-rays or MRIs).However, even if imaging tests show changes in your back, this usually does not require surgery. HOME CARE INSTRUCTIONS For many people, back pain returns.Since low back pain is rarely dangerous, it is often a condition that people can learn to manageon their own.   Remain active. It is stressful on the back to sit or stand in one place. Do not sit, drive, or stand in one place for more than 30 minutes at a time. Take short walks on level surfaces as soon as pain allows.Try to increase the length of time you walk each day.  Do not stay in bed.Resting more than 1 or 2 days can delay your recovery.  Do not avoid exercise or work.Your body is made to move.It is not dangerous to be active, even though your back may hurt.Your back will likely heal faster if you return to being active before your pain is gone.  Pay attention to your body when you bend and lift. Many people have less discomfortwhen lifting if they bend their knees, keep the load close to their bodies,and  avoid twisting. Often, the most comfortable positions are those that put less stress on your recovering back.  Find a comfortable position to sleep. Use a firm mattress and lie on your side with your knees slightly bent. If you lie on your back, put a pillow under your knees.  Only take over-the-counter or prescription medicines as directed by your caregiver. Over-the-counter medicines to reduce pain and inflammation are often the most helpful.Your caregiver may prescribe muscle relaxant drugs.These medicines help dull your pain so you can more quickly return to your normal activities and healthy exercise.  Put ice on the injured area.  Put ice in a plastic bag.  Place a towel between your skin and the bag.  Leave the ice on for 15-20 minutes, 03-04 times a day for the first 2 to 3 days. After that, ice and heat may be alternated to reduce pain and spasms.  Ask your caregiver about trying back exercises and gentle massage. This may be of some benefit.  Avoid feeling anxious or stressed.Stress increases muscle tension and can worsen back pain.It is important to recognize when you are anxious or stressed and learn ways to manage it.Exercise is a great option. SEEK MEDICAL CARE IF:  You have pain that is not relieved with rest or medicine.  You have pain that does not improve in 1 week.  You have new symptoms.  You are generally not feeling well. SEEK   IMMEDIATE MEDICAL CARE IF:   You have pain that radiates from your back into your legs.  You develop new bowel or bladder control problems.  You have unusual weakness or numbness in your arms or legs.  You develop nausea or vomiting.  You develop abdominal pain.  You feel faint. Document Released: 06/20/2005 Document Revised: 12/20/2011 Document Reviewed: 11/08/2010 [employer] Patient Information 2014 [employer].  

## 2013-06-06 NOTE — Progress Notes (Signed)
   Subjective:    Patient ID: Laura Combs, female    DOB: 31-Oct-1980, 32 y.o.   MRN: [id number]  Back Pain This is a new problem. The problem occurs intermittently. The problem has been gradually worsening since onset. The pain is present in the lumbar spine. The quality of the pain is described as stabbing. The pain radiates to the right thigh. The pain is at a severity of 9/10. The pain is severe. The pain is the same all the time. The symptoms are aggravated by bending, position, standing, twisting, sitting and lying down. Associated symptoms include leg pain. Pertinent negatives include no abdominal pain, fever, numbness, paresthesias, pelvic pain, perianal numbness, weakness or weight loss. She has tried NSAIDs and analgesics for the symptoms. The treatment provided mild relief.        Review of Systems  Constitutional: Negative for fever and weight loss.  Gastrointestinal: Negative for abdominal pain.  Genitourinary: Negative for pelvic pain.  Musculoskeletal: Positive for back pain.  Neurological: Negative for weakness, numbness and paresthesias.  All other systems reviewed and are negative.       Objective:   Physical Exam  Constitutional: She appears well-developed and well-nourished.  Musculoskeletal: She exhibits no edema and no tenderness.  Decrease ROM of lumbar spine due to flexion , extension and rotation. No point tenderness. (-) SLR  Neurological: She has normal reflexes.    BP 149/99  Pulse 90  Temp(Src) 97.6 F (36.4 C) (Oral)  Ht 5\' 3"  (1.6 m)  Wt 250 lb (113.399 kg)  BMI 44.30 kg/m2       Assessment & Plan:   1. Low back pain    Meds ordered this encounter  Medications  . ketorolac (TORADOL) injection 60 mg    Sig:   . predniSONE (DELTASONE) 20 MG tablet    Sig: 2 tablets po at same time daily for 5 days    Dispense:  10 tablet    Refill:  0    Order Specific Question:  Supervising Provider    Answer:  Ernestina Penna [[id number]]  .  cyclobenzaprine (FLEXERIL) 5 MG tablet    Sig: Take 1 tablet (5 mg total) by mouth 3 (three) times daily as needed for muscle spasms.    Dispense:  30 tablet    Refill:  1    Order Specific Question:  Supervising Provider    Answer:  Ernestina Penna [[id number]]    Moist heat Rest no heavy lifting RTO prn  Mary-Margaret Daphine Deutscher, FNP

## 2013-06-20 ENCOUNTER — Telehealth: Payer: Self-pay | Admitting: Nurse Practitioner

## 2013-06-20 NOTE — Telephone Encounter (Signed)
Had to take his son to the doctor on Monday he was positive for strep wants to know if you would call something in for her

## 2013-06-20 NOTE — Telephone Encounter (Signed)
NTBS.

## 2013-06-20 NOTE — Telephone Encounter (Signed)
Patient aware will call back and maka an appointment.

## 2013-06-21 ENCOUNTER — Encounter: Payer: Self-pay | Admitting: Physician Assistant

## 2013-06-21 ENCOUNTER — Ambulatory Visit (INDEPENDENT_AMBULATORY_CARE_PROVIDER_SITE_OTHER): Payer: BC Managed Care – PPO | Admitting: Physician Assistant

## 2013-06-21 VITALS — BP 130/82 | HR 99 | Temp 99.9°F | Ht 63.0 in | Wt 254.8 lb

## 2013-06-21 DIAGNOSIS — J020 Streptococcal pharyngitis: Secondary | ICD-10-CM

## 2013-06-21 LAB — POCT RAPID STREP A (OFFICE): Rapid Strep A Screen: NEGATIVE

## 2013-06-21 MED ORDER — AZITHROMYCIN 250 MG PO TABS: ORAL_TABLET | ORAL | Status: DC

## 2013-06-21 NOTE — Progress Notes (Signed)
   Subjective:    Patient ID: Laura Combs, female    DOB: [birth year], 32 y.o.   MRN: [id number]  HPI  32 y/o female presents with 2 days history of fever and sore throat. Child recently had strep.   Review of Systems  Constitutional: Positive for fever and chills.  HENT: Positive for sore throat and trouble swallowing. Negative for congestion, ear discharge, ear pain, postnasal drip and rhinorrhea.   All other systems reviewed and are negative.       Objective:   Physical Exam  Vitals reviewed. Constitutional: She appears well-developed and well-nourished.  HENT:  Head: Normocephalic and atraumatic.  Right Ear: External ear normal.  Left Ear: External ear normal.  Low grade fever 99.16F  Erythematous, injected and edematous Posterior pharynx bilaterally. Negative for tonsillar hypertrophy or exudate.   Submandibular node hypertrophy and TTP bilaterally    Rapid strep negative          Assessment & Plan:  1. Streptococcyl Pharyngitis: Rapid strep negative but with positive contacts and suspicious clinical exam prophylactic treatment with antibiotic warranted with zpak. Drink plenty of fluids. Tylenol/Motrin for pain, and fever. RTC if s/s worsen or do not improve.

## 2013-06-21 NOTE — Patient Instructions (Signed)
Drink plenty of fluids. Tylenol or ibuprofen for fever

## 2013-06-24 ENCOUNTER — Ambulatory Visit: Payer: BC Managed Care – PPO

## 2013-07-09 ENCOUNTER — Ambulatory Visit (INDEPENDENT_AMBULATORY_CARE_PROVIDER_SITE_OTHER): Payer: BC Managed Care – PPO | Admitting: General Practice

## 2013-07-09 ENCOUNTER — Encounter: Payer: Self-pay | Admitting: General Practice

## 2013-07-09 VITALS — BP 128/84 | HR 79 | Temp 98.5°F | Ht 63.0 in | Wt 250.0 lb

## 2013-07-09 DIAGNOSIS — Z0000 Encounter for general adult medical examination without abnormal findings: Secondary | ICD-10-CM

## 2013-07-09 DIAGNOSIS — Z23 Encounter for immunization: Secondary | ICD-10-CM

## 2013-07-09 DIAGNOSIS — Z021 Encounter for pre-employment examination: Secondary | ICD-10-CM

## 2013-07-09 DIAGNOSIS — Z111 Encounter for screening for respiratory tuberculosis: Secondary | ICD-10-CM

## 2013-07-09 NOTE — Progress Notes (Signed)
   Subjective:    Patient ID: Laura Combs, female    DOB: 31-Jul-1980, 31 y.o.   MRN: [id number]  HPI Patient presents today for wellness exam. 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 She denies any problems or concerns.     Review of Systems  Constitutional: Negative for fever and chills.  Respiratory: Negative for chest tightness and shortness of breath.   Cardiovascular: Negative for chest pain and palpitations.  Neurological: Negative for dizziness, weakness and headaches.  All other systems reviewed and are negative.       Objective:   Physical Exam  Constitutional: She is oriented to person, place, and time. She appears well-developed and well-nourished.  HENT:  Head: Normocephalic and atraumatic.  Right Ear: External ear normal.  Left Ear: External ear normal.  Eyes: EOM are normal. Pupils are equal, round, and reactive to light.  Neck: Normal range of motion. Neck supple. No thyromegaly present.  Cardiovascular: Normal rate, regular rhythm and normal heart sounds.   Pulmonary/Chest: Effort normal and breath sounds normal. No respiratory distress. She exhibits no tenderness.  Abdominal: Soft. Bowel sounds are normal.  Lymphadenopathy:    She has no cervical adenopathy.  Neurological: She is alert and oriented to person, place, and time.  Skin: Skin is warm and dry.  Psychiatric: She has a normal mood and affect.          Assessment & Plan:  1. Need for prophylactic vaccination and inoculation against influenza  - Tdap vaccine greater than or equal to 7yo IM - TB Skin Test  2. Physical exam, pre-employment -Denies needing labs drawn for place of employement -discussed healthy eating and incorporating some form of regular exercise -RTO prn and routine -Patient verbalized understanding Coralie KeensMae E. Nyajah Hyson, FNP-C

## 2013-07-11 ENCOUNTER — Encounter: Payer: BC Managed Care – PPO | Admitting: *Deleted

## 2013-07-11 LAB — TB SKIN TEST
Induration: 0 mm
TB SKIN TEST: NEGATIVE

## 2013-12-31 IMAGING — CR DG CHEST 2V
2 series · 2 of 2 positions shown · non-contrast
Comparison: 07/28/2009

CLINICAL DATA: Shortness of breath, cough.

CHEST - 2 VIEW

[view not recorded (1 of 2)]
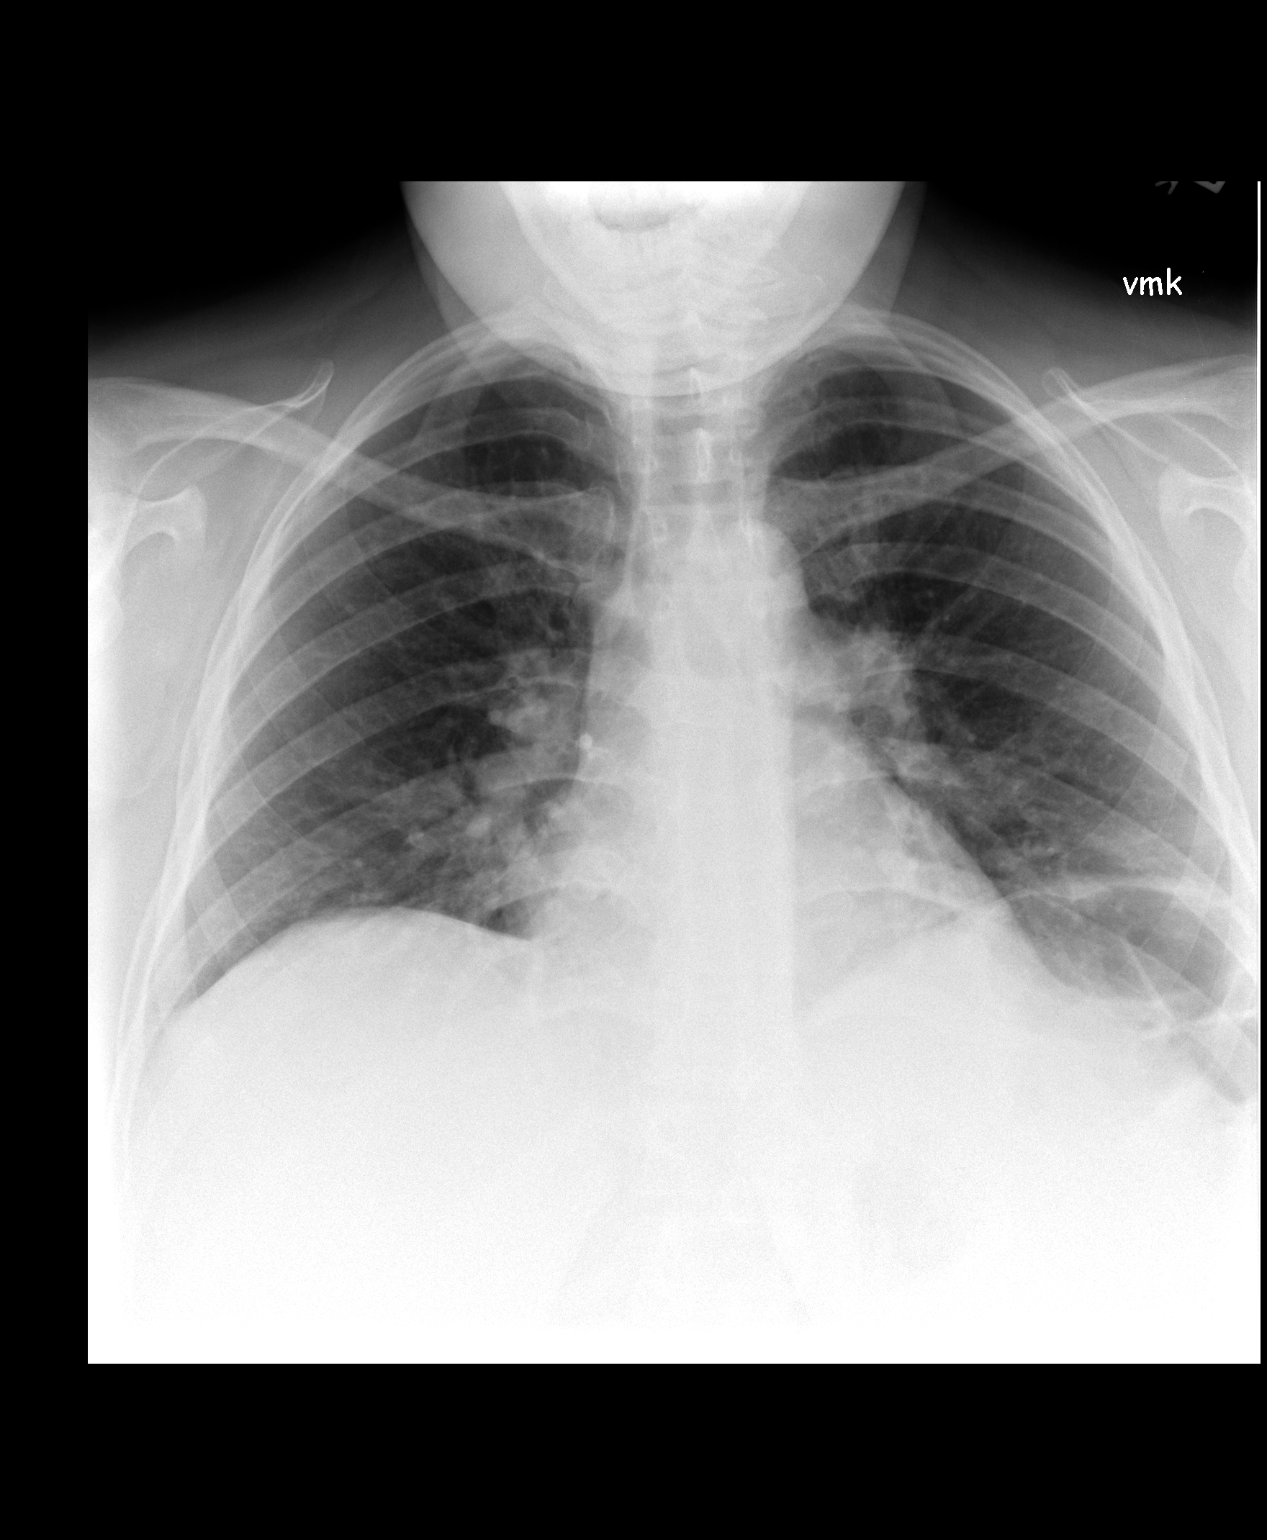

[view not recorded (2 of 2)]
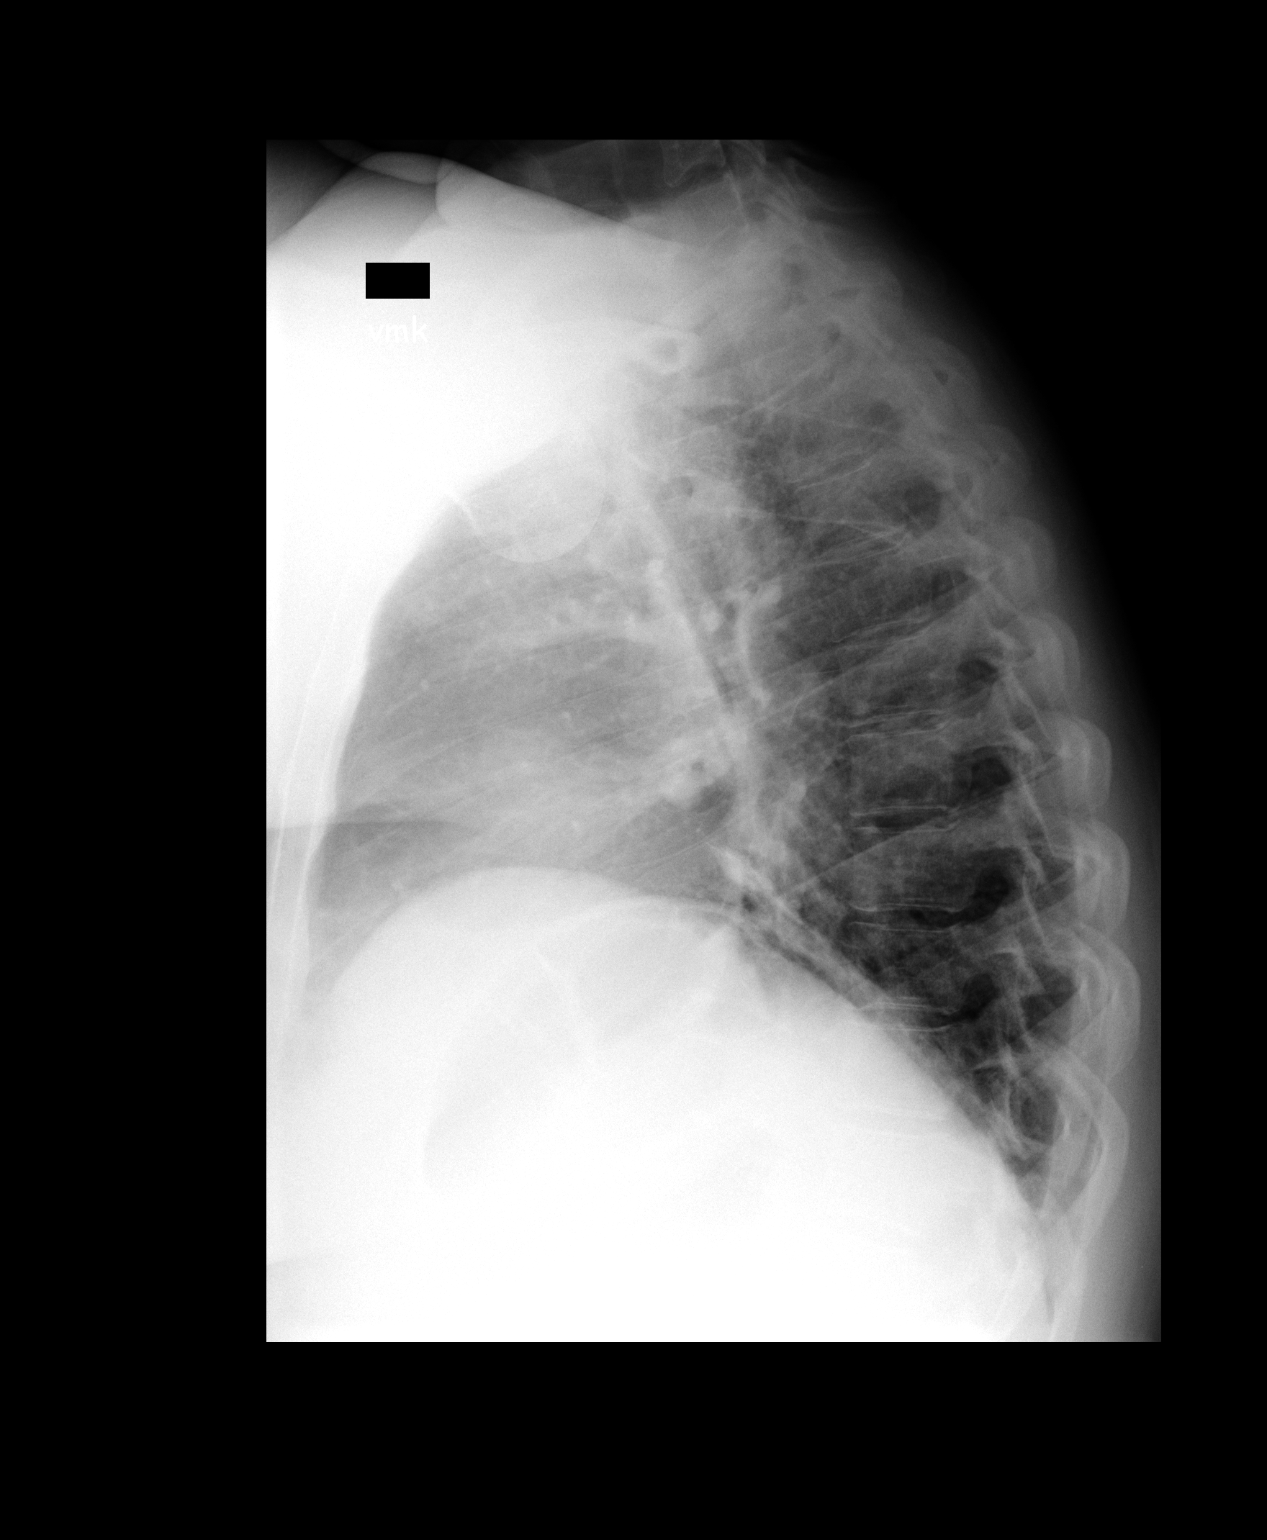

[2 of 2 positions shown; findings below may reference images not displayed]

FINDINGS: Low lung volumes with bibasilar densities, most likely
atelectasis.  Mild peribronchial thickening.  Heart is normal size.
No effusions.  No acute bony abnormality.
IMPRESSION: Low lung volumes with bibasilar densities, likely atelectasis.

Mild bronchitic changes.

## 2014-01-29 ENCOUNTER — Other Ambulatory Visit: Payer: Self-pay | Admitting: Dermatology

## 2014-04-28 ENCOUNTER — Telehealth: Payer: Self-pay | Admitting: Family Medicine

## 2014-04-28 NOTE — Telephone Encounter (Signed)
Pt notified no appts available. Will go to urgent care for HA today.

## 2014-04-29 ENCOUNTER — Ambulatory Visit (INDEPENDENT_AMBULATORY_CARE_PROVIDER_SITE_OTHER): Payer: BC Managed Care – PPO | Admitting: Family Medicine

## 2014-04-29 ENCOUNTER — Encounter: Payer: Self-pay | Admitting: Family Medicine

## 2014-04-29 VITALS — BP 137/86 | HR 85 | Temp 97.9°F | Ht 63.0 in | Wt 264.0 lb

## 2014-04-29 DIAGNOSIS — G44209 Tension-type headache, unspecified, not intractable: Secondary | ICD-10-CM

## 2014-04-29 DIAGNOSIS — G43909 Migraine, unspecified, not intractable, without status migrainosus: Secondary | ICD-10-CM

## 2014-04-29 DIAGNOSIS — G43009 Migraine without aura, not intractable, without status migrainosus: Secondary | ICD-10-CM

## 2014-04-29 MED ORDER — NAPROXEN 500 MG PO TBEC: 500.0000 mg | DELAYED_RELEASE_TABLET | Freq: Two times a day (BID) | ORAL | Status: DC

## 2014-04-29 NOTE — Patient Instructions (Signed)

## 2014-04-29 NOTE — Progress Notes (Signed)
Subjective:    Patient ID: Laura GoresKristin Bigos, female    DOB: Jun 15, 1981, 33 y.o.   MRN: [id number]  HPI Patient here today for migraines. This migraine she has today started last Wednesday.         There are no active problems to display for this patient.  Outpatient Encounter Prescriptions as of 04/29/2014  Medication Sig  . Aspirin-Acetaminophen-Caffeine (EXCEDRIN MIGRAINE PO) Take by mouth.  . etonogestrel-ethinyl estradiol (NUVARING) 0.12-0.015 MG/24HR vaginal ring Place 1 each vaginally every 28 (twenty-eight) days. Insert vaginally and leave in place for 3 consecutive weeks, then remove for 1 week.  . [DISCONTINUED] cyclobenzaprine (FLEXERIL) 5 MG tablet Take 1 tablet (5 mg total) by mouth 3 (three) times daily as needed for muscle spasms.    Review of Systems  Constitutional: Negative.   Eyes: Negative.   Respiratory: Negative.   Cardiovascular: Negative.   Gastrointestinal: Negative.   Endocrine: Negative.   Genitourinary: Negative.   Musculoskeletal: Negative.   Skin: Negative.   Allergic/Immunologic: Negative.   Neurological: Positive for dizziness (at times) and headaches.  Hematological: Negative.   Psychiatric/Behavioral: Negative.        Objective:   Physical Exam  Nursing note and vitals reviewed. Constitutional: She is oriented to person, place, and time. She appears well-developed and well-nourished.  HENT:  Right Ear: External ear normal.  Left Ear: External ear normal.  Nose: Nose normal.  Mouth/Throat: Oropharynx is clear and moist.  Eyes: Conjunctivae and EOM are normal. Pupils are equal, round, and reactive to light. Right eye exhibits no discharge. Left eye exhibits no discharge. No scleral icterus.  Pupils were equal round and reactive to light and accommodation. The discs were sharp bilaterally.  Neck: Normal range of motion. Neck supple. No thyromegaly present.  Cardiovascular: Normal rate, regular rhythm, normal heart sounds and intact  distal pulses.   No murmur heard. Pulmonary/Chest: Effort normal and breath sounds normal. No respiratory distress. She has no wheezes. She has no rales. She exhibits no tenderness.  Abdominal: Soft. Bowel sounds are normal. She exhibits no mass. There is no tenderness. There is no rebound and no guarding.  Musculoskeletal: Normal range of motion. She exhibits no edema.  Lymphadenopathy:    She has no cervical adenopathy.  Neurological: She is alert and oriented to person, place, and time.  Skin: Skin is warm and dry.  Psychiatric: She has a normal mood and affect. Her behavior is normal. Judgment and thought content normal.   BP 137/86  Pulse 85  Temp(Src) 97.9 F (36.6 C) (Oral)  Ht 5\' 3"  (1.6 m)  Wt 264 lb (119.75 kg)  BMI 46.78 kg/m2  LMP 03/04/2013        Assessment & Plan:  1. Migraine without aura and without status migrainosus, not intractable  2. Mixed migraine and muscle contraction headache - naproxen (EC-NAPROSYN) 500 MG EC tablet; Take 1 tablet (500 mg total) by mouth 2 (two) times daily with a meal.  Dispense: 60 tablet; Refill: 1  Patient Instructions  Migraine Headache A migraine headache is an intense, throbbing pain on one or both sides of your head. A migraine can last for 30 minutes to several hours. CAUSES  The exact cause of a migraine headache is not always known. However, a migraine may be caused when nerves in the brain become irritated and release chemicals that cause inflammation. This causes pain. Certain things may also trigger migraines, such as:  Alcohol.  Smoking.  Stress.  Menstruation.  Aged cheeses.  Foods or drinks that contain nitrates, glutamate, aspartame, or tyramine.  Lack of sleep.  Chocolate.  Caffeine.  Hunger.  Physical exertion.  Fatigue.  Medicines used to treat chest pain (nitroglycerine), birth control pills, estrogen, and some blood pressure medicines. SIGNS AND SYMPTOMS  Pain on one or both sides of  your head.  Pulsating or throbbing pain.  Severe pain that prevents daily activities.  Pain that is aggravated by any physical activity.  Nausea, vomiting, or both.  Dizziness.  Pain with exposure to bright lights, loud noises, or activity.  General sensitivity to bright lights, loud noises, or smells. Before you get a migraine, you may get warning signs that a migraine is coming (aura). An aura may include:  Seeing flashing lights.  Seeing bright spots, halos, or zigzag lines.  Having tunnel vision or blurred vision.  Having feelings of numbness or tingling.  Having trouble talking.  Having muscle weakness. DIAGNOSIS  A migraine headache is often diagnosed based on:  Symptoms.  Physical exam.  A CT scan or MRI of your head. These imaging tests cannot diagnose migraines, but they can help rule out other causes of headaches. TREATMENT Medicines may be given for pain and nausea. Medicines can also be given to help prevent recurrent migraines.  HOME CARE INSTRUCTIONS  Only take over-the-counter or prescription medicines for pain or discomfort as directed by your health care provider. The use of long-term narcotics is not recommended.  Lie down in a dark, quiet room when you have a migraine.  Keep a journal to find out what may trigger your migraine headaches. For example, write down:  What you eat and drink.  How much sleep you get.  Any change to your diet or medicines.  Limit alcohol consumption.  Quit smoking if you smoke.  Get 7-9 hours of sleep, or as recommended by your health care provider.  Limit stress.  Keep lights dim if bright lights bother you and make your migraines worse. SEEK IMMEDIATE MEDICAL CARE IF:   Your migraine becomes severe.  You have a fever.  You have a stiff neck.  You have vision loss.  You have muscular weakness or loss of muscle control.  You start losing your balance or have trouble walking.  You feel faint or  pass out.  You have severe symptoms that are different from your first symptoms. MAKE SURE YOU:   Understand these instructions.  Will watch your condition.  Will get help right away if you are not doing well or get worse. Document Released: 06/20/2005 Document Revised: 11/04/2013 Document Reviewed: 02/25/2013 [facility] Patient Information 2015 [employer] [place]. This information is not intended to replace advice given to you by your health care provider. Make sure you discuss any questions you have with your health care provider.    Nyra Capeson W. Burt Piatek MD

## 2014-05-05 ENCOUNTER — Encounter: Payer: Self-pay | Admitting: Family Medicine

## 2014-06-18 ENCOUNTER — Ambulatory Visit (INDEPENDENT_AMBULATORY_CARE_PROVIDER_SITE_OTHER): Payer: BC Managed Care – PPO | Admitting: Nurse Practitioner

## 2014-06-18 ENCOUNTER — Encounter: Payer: Self-pay | Admitting: Nurse Practitioner

## 2014-06-18 VITALS — Temp 99.2°F | Ht 63.0 in | Wt 262.0 lb

## 2014-06-18 DIAGNOSIS — R0989 Other specified symptoms and signs involving the circulatory and respiratory systems: Secondary | ICD-10-CM

## 2014-06-18 DIAGNOSIS — J069 Acute upper respiratory infection, unspecified: Secondary | ICD-10-CM

## 2014-06-18 LAB — POCT INFLUENZA A/B
Influenza A, POC: NEGATIVE
Influenza B, POC: NEGATIVE

## 2014-06-18 MED ORDER — BENZONATATE 100 MG PO CAPS: 100.0000 mg | ORAL_CAPSULE | Freq: Two times a day (BID) | ORAL | Status: DC | PRN

## 2014-06-18 MED ORDER — AZITHROMYCIN 250 MG PO TABS: ORAL_TABLET | ORAL | Status: DC

## 2014-06-18 MED ORDER — METHYLPREDNISOLONE ACETATE 80 MG/ML IJ SUSP
80.0000 mg | Freq: Once | INTRAMUSCULAR | Status: AC
  Administered 2014-06-18: 80 mg via INTRAMUSCULAR

## 2014-06-18 NOTE — Progress Notes (Signed)
   Subjective:    Patient ID: Laura Combs, female    DOB: September 05, 1980, 33 y.o.   MRN: [id number]  HPI Patient in today c/o fever 102 orally, cough , congestion, and achy.    Review of Systems  Constitutional: Positive for fever. Negative for appetite change.  HENT: Negative for trouble swallowing and voice change.   Respiratory: Positive for cough.   Cardiovascular: Negative.   Genitourinary: Negative.   Psychiatric/Behavioral: Negative.   All other systems reviewed and are negative.      Objective:   Physical Exam  Constitutional: She is oriented to person, place, and time. She appears well-developed and well-nourished. No distress.  HENT:  Right Ear: Hearing, tympanic membrane, external ear and ear canal normal.  Left Ear: Hearing, tympanic membrane, external ear and ear canal normal.  Nose: Mucosal edema and rhinorrhea present. Right sinus exhibits no maxillary sinus tenderness and no frontal sinus tenderness. Left sinus exhibits no maxillary sinus tenderness and no frontal sinus tenderness.  Mouth/Throat: Uvula is midline, oropharynx is clear and moist and mucous membranes are normal.  Eyes: Pupils are equal, round, and reactive to light.  Neck: Normal range of motion. Neck supple.  Cardiovascular: Normal rate, regular rhythm and normal heart sounds.   Pulmonary/Chest: Effort normal and breath sounds normal.  Deep tight cough  Abdominal: Bowel sounds are normal.  Lymphadenopathy:    She has no cervical adenopathy.  Neurological: She is alert and oriented to person, place, and time.  Skin: Skin is warm and dry.  Psychiatric: She has a normal mood and affect. Her behavior is normal. Judgment and thought content normal.    Temp(Src) 99.2 F (37.3 C) (Oral)  Ht 5\' 3"  (1.6 m)  Wt 262 lb (118.842 kg)  BMI 46.42 kg/m2        Assessment & Plan:  1. Chest congestion - POCT Influenza A/B  2. Upper respiratory infection with cough and congestion 1. Take meds as  prescribed 2. Use a cool mist humidifier especially during the winter months and when heat has been humid. 3. Use saline nose sprays frequently 4. Saline irrigations of the nose can be very helpful if done frequently.  * 4X daily for 1 week*  * Use of a nettie pot can be helpful with this. Follow directions with this* 5. Drink plenty of fluids 6. Keep thermostat turn down low 7.For any cough or congestion  Use plain Mucinex- regular strength or max strength is fine   * Children- consult with Pharmacist for dosing 8. For fever or aces or pains- take tylenol or ibuprofen appropriate for age and weight.  * for fevers greater than 101 orally you may alternate ibuprofen and tylenol every  3 hours.   - methylPREDNISolone acetate (DEPO-MEDROL) injection 80 mg; Inject 1 mL (80 mg total) into the muscle once. - azithromycin (ZITHROMAX Z-PAK) 250 MG tablet; As directed  Dispense: 6 each; Refill: 0 - benzonatate (TESSALON) 100 MG capsule; Take 1 capsule (100 mg total) by mouth 2 (two) times daily as needed for cough.  Dispense: 20 capsule; Refill: 0  Mary-Margaret Daphine DeutscherMartin, FNP

## 2014-06-18 NOTE — Patient Instructions (Signed)

## 2014-08-27 ENCOUNTER — Telehealth: Payer: Self-pay | Admitting: Nurse Practitioner

## 2014-08-27 NOTE — Telephone Encounter (Signed)
Appointment given for Monday with Laura Combs at 4:30

## 2014-09-01 ENCOUNTER — Encounter: Payer: Self-pay | Admitting: Nurse Practitioner

## 2014-09-01 ENCOUNTER — Ambulatory Visit (INDEPENDENT_AMBULATORY_CARE_PROVIDER_SITE_OTHER): Payer: BLUE CROSS/BLUE SHIELD | Admitting: Nurse Practitioner

## 2014-09-01 VITALS — BP 140/89 | HR 91 | Temp 98.6°F | Ht 63.0 in | Wt 260.0 lb

## 2014-09-01 DIAGNOSIS — G43011 Migraine without aura, intractable, with status migrainosus: Secondary | ICD-10-CM

## 2014-09-01 DIAGNOSIS — F411 Generalized anxiety disorder: Secondary | ICD-10-CM | POA: Diagnosis not present

## 2014-09-01 MED ORDER — SUMATRIPTAN SUCCINATE 50 MG PO TABS: 50.0000 mg | ORAL_TABLET | ORAL | Status: DC | PRN

## 2014-09-01 MED ORDER — ESCITALOPRAM OXALATE 10 MG PO TABS: 10.0000 mg | ORAL_TABLET | Freq: Every day | ORAL | Status: DC

## 2014-09-01 NOTE — Patient Instructions (Signed)
Stress and Stress Management Stress is a normal reaction to life events. It is what you feel when life demands more than you are used to or more than you can handle. Some stress can be useful. For example, the stress reaction can help you catch the last bus of the day, study for a test, or meet a deadline at work. But stress that occurs too often or for too long can cause problems. It can affect your emotional health and interfere with relationships and normal daily activities. Too much stress can weaken your immune system and increase your risk for physical illness. If you already have a medical problem, stress can make it worse. CAUSES  All sorts of life events may cause stress. An event that causes stress for one person may not be stressful for another person. Major life events commonly cause stress. These may be positive or negative. Examples include losing your job, moving into a new home, getting married, having a baby, or losing a loved one. Less obvious life events may also cause stress, especially if they occur day after day or in combination. Examples include working long hours, driving in traffic, caring for children, being in debt, or being in a difficult relationship. SIGNS AND SYMPTOMS Stress may cause emotional symptoms including, the following:  Anxiety. This is feeling worried, afraid, on edge, overwhelmed, or out of control.  Anger. This is feeling irritated or impatient.  Depression. This is feeling sad, down, helpless, or guilty.  Difficulty focusing, remembering, or making decisions. Stress may cause physical symptoms, including the following:   Aches and pains. These may affect your head, neck, back, stomach, or other areas of your body.  Tight muscles or clenched jaw.  Low energy or trouble sleeping. Stress may cause unhealthy behaviors, including the following:   Eating to feel better (overeating) or skipping meals.  Sleeping too little, too much, or both.  Working  too much or putting off tasks (procrastination).  Smoking, drinking alcohol, or using drugs to feel better. DIAGNOSIS  Stress is diagnosed through an assessment by your health care provider. Your health care provider will ask questions about your symptoms and any stressful life events.Your health care provider will also ask about your medical history and may order blood tests or other tests. Certain medical conditions and medicine can cause physical symptoms similar to stress. Mental illness can cause emotional symptoms and unhealthy behaviors similar to stress. Your health care provider may refer you to a mental health professional for further evaluation.  TREATMENT  Stress management is the recommended treatment for stress.The goals of stress management are reducing stressful life events and coping with stress in healthy ways.  Techniques for reducing stressful life events include the following:  Stress identification. Self-monitor for stress and identify what causes stress for you. These skills may help you to avoid some stressful events.  Time management. Set your priorities, keep a calendar of events, and learn to say "no." These tools can help you avoid making too many commitments. Techniques for coping with stress include the following:  Rethinking the problem. Try to think realistically about stressful events rather than ignoring them or overreacting. Try to find the positives in a stressful situation rather than focusing on the negatives.  Exercise. Physical exercise can release both physical and emotional tension. The key is to find a form of exercise you enjoy and do it regularly.  Relaxation techniques. These relax the body and mind. Examples include yoga, meditation, tai chi, biofeedback, deep  breathing, progressive muscle relaxation, listening to music, being out in nature, journaling, and other hobbies. Again, the key is to find one or more that you enjoy and can do  regularly.  Healthy lifestyle. Eat a balanced diet, get plenty of sleep, and do not smoke. Avoid using alcohol or drugs to relax.  Strong support network. Spend time with family, friends, or other people you enjoy being around.Express your feelings and talk things over with someone you trust. Counseling or talktherapy with a mental health professional may be helpful if you are having difficulty managing stress on your own. Medicine is typically not recommended for the treatment of stress.Talk to your health care provider if you think you need medicine for symptoms of stress. HOME CARE INSTRUCTIONS  Keep all follow-up visits as directed by your health care provider.  Take all medicines as directed by your health care provider. SEEK MEDICAL CARE IF:  Your symptoms get worse or you start having new symptoms.  You feel overwhelmed by your problems and can no longer manage them on your own. SEEK IMMEDIATE MEDICAL CARE IF:  You feel like hurting yourself or someone else. Document Released: 12/14/2000 Document Revised: 11/04/2013 Document Reviewed: 02/12/2013 [employer] Patient Information 2015 [employer]. This information is not intended to replace advice given to you by your health care provider. Make sure you discuss any questions you have with your health care provider.  

## 2014-09-01 NOTE — Addendum Note (Signed)
Addended by: Bennie PieriniMARTIN, MARY-MARGARET on: 09/01/2014 05:08 PM   Modules accepted: Orders

## 2014-09-01 NOTE — Progress Notes (Signed)
   Subjective:    Patient ID: Laura GoresKristin Langdon, female    DOB: September 06, 1980, 34 y.o.   MRN: [id number]  HPI  Patient reports experiencing frequent anxiety. She reports feeling very stressed at work and is unable to relax at home. Reports feeling on the verge of having panic attacks 2-3 times per week. Frequently feels tearful and reluctant to go to work or outside of home. She has been on cymbalta in the past and it worked well without side effects- Stopped taking several years ago and does not remember why- Would like t try something different because would rather be on something generic.  * patient has history of migraines- they went away after having her first child and then returned after having her secon chld- they still aren't very often but when she does have one it is debilitating. Naproxyn not helping.     Review of Systems  Constitutional: Positive for fatigue. Negative for activity change, appetite change and unexpected weight change.  Respiratory: Chest tightness: Only when feeling anxiety, not currently experiencing.    Cardiovascular: Negative.   Neurological: Negative.   Psychiatric/Behavioral: Negative for sleep disturbance and self-injury.       Objective:   Physical Exam  Constitutional: She is oriented to person, place, and time. She appears well-developed and well-nourished.  Cardiovascular: Normal rate, regular rhythm and normal heart sounds.   Pulmonary/Chest: Effort normal and breath sounds normal.  Neurological: She is alert and oriented to person, place, and time.  Skin: Skin is warm.  Psychiatric: She has a normal mood and affect. Her behavior is normal. Thought content normal.   BP 140/89 mmHg  Pulse 91  Temp(Src) 98.6 F (37 C) (Oral)  Ht 5\' 3"  (1.6 m)  Wt 260 lb (117.935 kg)  BMI 46.07 kg/m2        Assessment & Plan:  1. GAD (generalized anxiety disorder) Stress management - escitalopram (LEXAPRO) 10 MG tablet; Take 1 tablet (10 mg total) by mouth  daily.  Dispense: 30 tablet; Refill: 5  2. Intractable migraine without aura and with status migrainosus Avoid caffeine Keep diary of occurences - SUMAtriptan (IMITREX) 50 MG tablet; Take 1 tablet (50 mg total) by mouth every 2 (two) hours as needed for migraine. May repeat in 2 hours if headache persists or recurs.  Dispense: 10 tablet; Refill: 0  Mary-Margaret Daphine DeutscherMartin, FNP

## 2014-09-23 ENCOUNTER — Encounter: Payer: Self-pay | Admitting: Nurse Practitioner

## 2014-09-23 ENCOUNTER — Ambulatory Visit (INDEPENDENT_AMBULATORY_CARE_PROVIDER_SITE_OTHER): Payer: BLUE CROSS/BLUE SHIELD | Admitting: Nurse Practitioner

## 2014-09-23 VITALS — BP 141/96 | HR 94 | Temp 98.3°F | Ht 63.0 in | Wt 259.6 lb

## 2014-09-23 DIAGNOSIS — F411 Generalized anxiety disorder: Secondary | ICD-10-CM

## 2014-09-23 NOTE — Patient Instructions (Signed)
Stress and Stress Management Stress is a normal reaction to life events. It is what you feel when life demands more than you are used to or more than you can handle. Some stress can be useful. For example, the stress reaction can help you catch the last bus of the day, study for a test, or meet a deadline at work. But stress that occurs too often or for too long can cause problems. It can affect your emotional health and interfere with relationships and normal daily activities. Too much stress can weaken your immune system and increase your risk for physical illness. If you already have a medical problem, stress can make it worse. CAUSES  All sorts of life events may cause stress. An event that causes stress for one person may not be stressful for another person. Major life events commonly cause stress. These may be positive or negative. Examples include losing your job, moving into a new home, getting married, having a baby, or losing a loved one. Less obvious life events may also cause stress, especially if they occur day after day or in combination. Examples include working long hours, driving in traffic, caring for children, being in debt, or being in a difficult relationship. SIGNS AND SYMPTOMS Stress may cause emotional symptoms including, the following:  Anxiety. This is feeling worried, afraid, on edge, overwhelmed, or out of control.  Anger. This is feeling irritated or impatient.  Depression. This is feeling sad, down, helpless, or guilty.  Difficulty focusing, remembering, or making decisions. Stress may cause physical symptoms, including the following:   Aches and pains. These may affect your head, neck, back, stomach, or other areas of your body.  Tight muscles or clenched jaw.  Low energy or trouble sleeping. Stress may cause unhealthy behaviors, including the following:   Eating to feel better (overeating) or skipping meals.  Sleeping too little, too much, or both.  Working  too much or putting off tasks (procrastination).  Smoking, drinking alcohol, or using drugs to feel better. DIAGNOSIS  Stress is diagnosed through an assessment by your health care provider. Your health care provider will ask questions about your symptoms and any stressful life events.Your health care provider will also ask about your medical history and may order blood tests or other tests. Certain medical conditions and medicine can cause physical symptoms similar to stress. Mental illness can cause emotional symptoms and unhealthy behaviors similar to stress. Your health care provider may refer you to a mental health professional for further evaluation.  TREATMENT  Stress management is the recommended treatment for stress.The goals of stress management are reducing stressful life events and coping with stress in healthy ways.  Techniques for reducing stressful life events include the following:  Stress identification. Self-monitor for stress and identify what causes stress for you. These skills may help you to avoid some stressful events.  Time management. Set your priorities, keep a calendar of events, and learn to say "no." These tools can help you avoid making too many commitments. Techniques for coping with stress include the following:  Rethinking the problem. Try to think realistically about stressful events rather than ignoring them or overreacting. Try to find the positives in a stressful situation rather than focusing on the negatives.  Exercise. Physical exercise can release both physical and emotional tension. The key is to find a form of exercise you enjoy and do it regularly.  Relaxation techniques. These relax the body and mind. Examples include yoga, meditation, tai chi, biofeedback, deep  breathing, progressive muscle relaxation, listening to music, being out in nature, journaling, and other hobbies. Again, the key is to find one or more that you enjoy and can do  regularly.  Healthy lifestyle. Eat a balanced diet, get plenty of sleep, and do not smoke. Avoid using alcohol or drugs to relax.  Strong support network. Spend time with family, friends, or other people you enjoy being around.Express your feelings and talk things over with someone you trust. Counseling or talktherapy with a mental health professional may be helpful if you are having difficulty managing stress on your own. Medicine is typically not recommended for the treatment of stress.Talk to your health care provider if you think you need medicine for symptoms of stress. HOME CARE INSTRUCTIONS  Keep all follow-up visits as directed by your health care provider.  Take all medicines as directed by your health care provider. SEEK MEDICAL CARE IF:  Your symptoms get worse or you start having new symptoms.  You feel overwhelmed by your problems and can no longer manage them on your own. SEEK IMMEDIATE MEDICAL CARE IF:  You feel like hurting yourself or someone else. Document Released: 12/14/2000 Document Revised: 11/04/2013 Document Reviewed: 02/12/2013 [employer] Patient Information 2015 [employer]. This information is not intended to replace advice given to you by your health care provider. Make sure you discuss any questions you have with your health care provider.  

## 2014-09-23 NOTE — Progress Notes (Signed)
   Subjective:    Patient ID: Laura Combs, female    DOB: [birth year], 34 y.o.   MRN: [id number]  HPI Patient in today for recheck. SHe was seen for GAD and was started on lexapro- patient says she is doing much better- keeps her calm and from worrying so much- denies any side effects.    Review of Systems  Constitutional: Negative.   HENT: Negative.   Respiratory: Negative.   Cardiovascular: Negative.   Genitourinary: Negative.   Neurological: Negative.   Hematological: Negative.   Psychiatric/Behavioral: Negative.   All other systems reviewed and are negative.      Objective:   Physical Exam  Constitutional: She is oriented to person, place, and time. She appears well-developed and well-nourished.  Cardiovascular: Normal rate, regular rhythm and normal heart sounds.   Pulmonary/Chest: Effort normal and breath sounds normal.  Neurological: She is alert and oriented to person, place, and time.  Skin: Skin is warm and dry.  Psychiatric: She has a normal mood and affect. Her behavior is normal. Judgment and thought content normal.   BP 141/96 mmHg  Pulse 94  Temp(Src) 98.3 F (36.8 C) (Oral)  Ht 5\' 3"  (1.6 m)  Wt 259 lb 9.6 oz (117.754 kg)  BMI 46.00 kg/m2        Assessment & Plan:   1. GAD (generalized anxiety disorder)    Continue lexapro as rx Stress management RTO prn  Mary-Margaret Daphine DeutscherMartin, FNP

## 2014-11-05 ENCOUNTER — Other Ambulatory Visit: Payer: Self-pay | Admitting: Nurse Practitioner

## 2014-11-05 NOTE — Telephone Encounter (Signed)
Last seen 09/23/14 MMM 

## 2015-01-20 ENCOUNTER — Other Ambulatory Visit: Payer: Self-pay | Admitting: Obstetrics and Gynecology

## 2015-01-21 LAB — CYTOLOGY - PAP

## 2015-02-28 ENCOUNTER — Other Ambulatory Visit: Payer: Self-pay | Admitting: Nurse Practitioner

## 2015-03-02 NOTE — Telephone Encounter (Signed)
Last seen 09/23/14 MMM 

## 2015-03-20 ENCOUNTER — Telehealth: Payer: Self-pay | Admitting: Nurse Practitioner

## 2015-04-01 NOTE — Telephone Encounter (Signed)
Patient aware and appointment scheduled.  

## 2015-04-01 NOTE — Telephone Encounter (Signed)
She needs to come in and be seen for the migraines, not something I can manage over the phone. Last seen for headaches 04/2014, now with worsening symptoms.

## 2015-04-01 NOTE — Telephone Encounter (Signed)
Patient states that her migraines are happening more frequently and about once a week. She states that she is having to take 3-4 imitrex with each migraine and it is not helping that much. Please advise

## 2015-04-02 ENCOUNTER — Ambulatory Visit (INDEPENDENT_AMBULATORY_CARE_PROVIDER_SITE_OTHER): Payer: BLUE CROSS/BLUE SHIELD | Admitting: Pediatrics

## 2015-04-02 ENCOUNTER — Encounter: Payer: Self-pay | Admitting: Pediatrics

## 2015-04-02 VITALS — BP 141/96 | HR 84 | Temp 97.2°F | Ht 63.0 in | Wt 260.0 lb

## 2015-04-02 DIAGNOSIS — IMO0001 Reserved for inherently not codable concepts without codable children: Secondary | ICD-10-CM

## 2015-04-02 DIAGNOSIS — F411 Generalized anxiety disorder: Secondary | ICD-10-CM

## 2015-04-02 DIAGNOSIS — J019 Acute sinusitis, unspecified: Secondary | ICD-10-CM

## 2015-04-02 DIAGNOSIS — R030 Elevated blood-pressure reading, without diagnosis of hypertension: Secondary | ICD-10-CM | POA: Diagnosis not present

## 2015-04-02 DIAGNOSIS — R062 Wheezing: Secondary | ICD-10-CM

## 2015-04-02 MED ORDER — GUAIFENESIN-CODEINE 100-10 MG/5ML PO SOLN: 5.0000 mL | Freq: Three times a day (TID) | ORAL | Status: DC | PRN

## 2015-04-02 MED ORDER — AMOXICILLIN-POT CLAVULANATE 875-125 MG PO TABS: 1.0000 | ORAL_TABLET | Freq: Two times a day (BID) | ORAL | Status: DC

## 2015-04-02 MED ORDER — ALBUTEROL SULFATE HFA 108 (90 BASE) MCG/ACT IN AERS: 2.0000 | INHALATION_SPRAY | Freq: Four times a day (QID) | RESPIRATORY_TRACT | Status: DC | PRN

## 2015-04-02 MED ORDER — ESCITALOPRAM OXALATE 10 MG PO TABS: 10.0000 mg | ORAL_TABLET | Freq: Every day | ORAL | Status: DC

## 2015-04-02 NOTE — Patient Instructions (Signed)
Antibiotic twice a day for 10 days Take probiotics Lots of fluids Try netipot or similar for sinus pressure Take albuterol 3 times a day to help with wheezing/cough, use spacer Take cough syrup 1-2 teapsoons at night before bed to help with nighttime cough.

## 2015-04-02 NOTE — Addendum Note (Signed)
Addended by: Johna Sheriff on: 04/02/2015 09:26 PM   Modules accepted: Kipp Brood

## 2015-04-02 NOTE — Progress Notes (Addendum)
Subjective:    Patient ID: Laura Combs, female    DOB: 1980/10/23, 34 y.o.   MRN: [id number]  CC: cough, URi  HPI: Laura Combs is a 34 y.o. female presenting on 04/02/2015 for Nasal Congestion; Cough; and Sore Throat  About 7 days ago syymptoms started, now still with nasal congestion, facial pressure, cough. Constant ringing in her ears now L ear feels stopped up Coughing a lot, esp at night, wakes her up choking with sputum Taking advil cold and sinus, nyquil Tried albuterol inhaler, at first, thinks it helped at first, also thinks it is out of date. Yesterday felt better then today much worse again No fevers.  Lexapro has been helping a lot with her mood.  Does have decreased libido with it, thinks it is helping enough she is ok with dealing with the side effects for now. Has been on it for apprx 6 months.  Relevant past medical, surgical, family and social history reviewed and updated as indicated. Interim medical history since our last visit reviewed. Allergies and medications reviewed and updated.  ROS: Per HPI unless specifically indicated above  Past Medical History There are no active problems to display for this patient.   Current Outpatient Prescriptions  Medication Sig Dispense Refill  . escitalopram (LEXAPRO) 10 MG tablet Take 1 tablet (10 mg total) by mouth daily. 30 tablet 5  . etonogestrel-ethinyl estradiol (NUVARING) 0.12-0.015 MG/24HR vaginal ring Place 1 each vaginally every 28 (twenty-eight) days. Insert vaginally and leave in place for 3 consecutive weeks, then remove for 1 week.    . SUMAtriptan (IMITREX) 50 MG tablet Take 1 tablet (50 mg total) by mouth every 2 (two) hours as needed for migraine. May repeat in 2 hours if headache persists or recurs. 10 tablet 0  . SUMAtriptan (IMITREX) 50 MG tablet TAKE 1 TABLET AT ONSET OF MIGRAINE. MAY REPEAT IN 2 HOURS IF PERSISTSOR RECURS 9 tablet 1  . SUMAtriptan (IMITREX) 50 MG tablet TAKE 1 TABLET AT ONSET  OF MIGRAINE. MAY REPEAT IN 2 HOURS IF PERSISTSOR RECURS 9 tablet 0  . albuterol (PROVENTIL HFA;VENTOLIN HFA) 108 (90 BASE) MCG/ACT inhaler Inhale 2 puffs into the lungs every 6 (six) hours as needed for wheezing or shortness of breath. 1 Inhaler 0  . amoxicillin-clavulanate (AUGMENTIN) 875-125 MG tablet Take 1 tablet by mouth 2 (two) times daily. 20 tablet 0  . guaiFENesin-codeine 100-10 MG/5ML syrup Take 5-10 mLs by mouth 3 (three) times daily as needed for cough. 120 mL 0   No current facility-administered medications for this visit.       Objective:    BP 141/96 mmHg  Pulse 84  Temp(Src) 97.2 F (36.2 C) (Oral)  Ht  (1.6 m)  Wt 260 lb (117.935 kg)  BMI 46.07 kg/m2  Wt Readings from Last 3 Encounters:  04/02/15 260 lb (117.935 kg)  09/23/14 259 lb 9.6 oz (117.754 kg)  09/01/14 260 lb (117.935 kg)    Gen: NAD, alert, cooperative with exam, NCAT EYES: EOMI, no scleral injection or icterus ENT:  R TM mildly erythematous, L TM with some scarring and fluid, red, not bulging, OP with erythema, tender over sinuses LYMPH: +small <1cm L sided anterior cervical LAD CV: NRRR, normal S1/S2, no murmur, DP pulses 2+ b/l Resp: moving air well, scattered wheezes heard with expiration b/l, normal WOB Abd: +BS, soft, NTND. no guarding or organomegaly Neuro: Alert and oriented Psych: normal affect    Assessment & Plan:    Chyrel was  seen today for acute sinusitis and GAD.  Diagnoses and all orders for this visit:  Acute sinusitis, recurrence not specified, unspecified location  -     amoxicillin-clavulanate (AUGMENTIN) 875-125 MG tablet; Take 1 tablet by mouth 2 (two) times daily. -     guaiFENesin-codeine 100-10 MG/5ML syrup; Take 5-10 mLs by mouth 3 (three) times daily as needed for cough. -  NSAIDs, netipot  GAD (generalized anxiety disorder) Symptoms well controlled on current regimen, on it past 6 mo. Continue medication. -     escitalopram (LEXAPRO) 10 MG tablet; Take 1  tablet (10 mg total) by mouth daily.  Wheezing viral induced wheezing episode, history of asthma in the past. -     albuterol (PROVENTIL HFA;VENTOLIN HFA) 108 (90 BASE) MCG/ACT inhaler; Inhale 2 puffs into the lungs every 6 (six) hours as needed for wheezing or shortness of breath. Use with spacer every time.  Elevated Blood pressure Has been elevated at past visits, though now pt has been taking cold/allergy medicines recently. Will check at home over next week, has follow up appt with PCP 10/11.  Follow up plan: Return for as scheduled.  Laura Kras, MD [facility] 04/02/2015, 4:57 PM

## 2015-04-14 ENCOUNTER — Encounter: Payer: Self-pay | Admitting: Nurse Practitioner

## 2015-04-14 ENCOUNTER — Ambulatory Visit (INDEPENDENT_AMBULATORY_CARE_PROVIDER_SITE_OTHER): Payer: BLUE CROSS/BLUE SHIELD | Admitting: Nurse Practitioner

## 2015-04-14 VITALS — BP 153/103 | HR 80 | Temp 97.9°F | Ht 63.0 in | Wt 261.0 lb

## 2015-04-14 DIAGNOSIS — Z23 Encounter for immunization: Secondary | ICD-10-CM

## 2015-04-14 DIAGNOSIS — G43101 Migraine with aura, not intractable, with status migrainosus: Secondary | ICD-10-CM

## 2015-04-14 MED ORDER — ELETRIPTAN HYDROBROMIDE 20 MG PO TABS: 20.0000 mg | ORAL_TABLET | ORAL | Status: DC | PRN

## 2015-04-14 MED ORDER — TOPIRAMATE 50 MG PO TABS: 50.0000 mg | ORAL_TABLET | Freq: Two times a day (BID) | ORAL | Status: DC

## 2015-04-14 MED ORDER — KETOROLAC TROMETHAMINE 60 MG/2ML IM SOLN
60.0000 mg | Freq: Once | INTRAMUSCULAR | Status: AC
  Administered 2015-04-14: 60 mg via INTRAMUSCULAR

## 2015-04-14 NOTE — Patient Instructions (Signed)

## 2015-04-14 NOTE — Progress Notes (Signed)
  Subjective:    Laura Combs is a 34 y.o. female who presents for evaluation of headache. Symptoms began about 1 day ago. Generally, the headaches last about 1 hour and occur every morning. The headaches are usually better in the afternoon and in the evening. The headaches are usually sharp and throbbing and are located on the left side of her head.  The patient rates her most severe headaches a 2 on a scale from 1 to 10. Recently, the headaches have been stable. Work attendance or other daily activities are affected by the headaches. Precipitating factors include: hot temperatures, stress and lack of stress, chocolate. The headaches are usually preceded by an aura consisting of blurry vision. Associated neurologic symptoms: decreased physical activity and dizziness. The patient denies depression, loss of balance and muscle weakness. Home treatment has included Imitrex oral with some improvement. Other history includes: nothing pertinent. Family history includes migraine headaches in grandmother.  The following portions of the patient's history were reviewed and updated as appropriate: allergies, current medications, past family history, past medical history, past social history, past surgical history and problem list.  Review of Systems Pertinent items are noted in HPI.    Objective:    General appearance: alert and cooperative Head: Normocephalic, without obvious abnormality, atraumatic Eyes: conjunctivae/corneas clear. PERRL, EOM's intact. Fundi benign. Neck: no adenopathy, no carotid bruit, no JVD, supple, symmetrical, trachea midline and thyroid not enlarged, symmetric, no tenderness/mass/nodules Lungs: clear to auscultation bilaterally Heart: regular rate and rhythm, S1, S2 normal, no murmur, click, rub or gallop    Assessment:    Common migraine    Plan:    Lie in darkened room and apply cold packs as needed for pain. Side effect profile discussed in detail. Asked to keep  headache diary. Patient reassured that neurodiagnostic workup not indicated from benign H&P.     Meds ordered this encounter  Medications  . topiramate (TOPAMAX) 50 MG tablet    Sig: Take 1 tablet (50 mg total) by mouth 2 (two) times daily.    Dispense:  60 tablet    Refill:  2    Order Specific Question:  Supervising Provider    Answer:  Ernestina Penna [[id number]]  . eletriptan (RELPAX) 20 MG tablet    Sig: Take 1 tablet (20 mg total) by mouth as needed for migraine or headache. May repeat in 2 hours if headache persists or recurs.    Dispense:  10 tablet    Refill:  2    Order Specific Question:  Supervising Provider    Answer:  Ernestina Penna [[id number]]  . ketorolac (TORADOL) injection 60 mg    Sig:     Mary-Margaret Daphine Deutscher, FNP

## 2015-04-16 DIAGNOSIS — Z23 Encounter for immunization: Secondary | ICD-10-CM

## 2015-06-09 ENCOUNTER — Other Ambulatory Visit: Payer: Self-pay | Admitting: Nurse Practitioner

## 2015-06-10 NOTE — Telephone Encounter (Signed)
Last seen 04/14/15  MMM 

## 2015-07-05 DIAGNOSIS — Z87442 Personal history of urinary calculi: Secondary | ICD-10-CM

## 2015-07-05 HISTORY — DX: Personal history of urinary calculi: Z87.442

## 2015-09-04 ENCOUNTER — Telehealth: Payer: Self-pay | Admitting: Nurse Practitioner

## 2015-09-04 MED ORDER — OSELTAMIVIR PHOSPHATE 75 MG PO CAPS: 75.0000 mg | ORAL_CAPSULE | Freq: Every day | ORAL | Status: DC

## 2015-09-04 NOTE — Telephone Encounter (Signed)
tamiflu rx sent to pharmacy per MMM.

## 2015-09-26 ENCOUNTER — Other Ambulatory Visit: Payer: Self-pay | Admitting: Nurse Practitioner

## 2015-09-28 ENCOUNTER — Encounter: Payer: Self-pay | Admitting: Family

## 2015-09-28 ENCOUNTER — Ambulatory Visit (INDEPENDENT_AMBULATORY_CARE_PROVIDER_SITE_OTHER): Payer: BLUE CROSS/BLUE SHIELD | Admitting: Family

## 2015-09-28 ENCOUNTER — Telehealth: Payer: Self-pay | Admitting: Nurse Practitioner

## 2015-09-28 VITALS — BP 153/104 | HR 75 | Temp 98.0°F | Ht 63.0 in | Wt 264.6 lb

## 2015-09-28 DIAGNOSIS — N943 Premenstrual tension syndrome: Secondary | ICD-10-CM

## 2015-09-28 DIAGNOSIS — G43829 Menstrual migraine, not intractable, without status migrainosus: Secondary | ICD-10-CM

## 2015-09-28 DIAGNOSIS — G43909 Migraine, unspecified, not intractable, without status migrainosus: Secondary | ICD-10-CM | POA: Insufficient documentation

## 2015-09-28 MED ORDER — MAGNESIUM 400 MG PO CAPS: 2.0000 | ORAL_CAPSULE | Freq: Every day | ORAL | Status: DC

## 2015-09-28 MED ORDER — KETOROLAC TROMETHAMINE 60 MG/2ML IM SOLN
60.0000 mg | Freq: Once | INTRAMUSCULAR | Status: AC
  Administered 2015-09-28: 60 mg via INTRAMUSCULAR

## 2015-09-28 MED ORDER — PROPRANOLOL HCL 40 MG PO TABS: 40.0000 mg | ORAL_TABLET | Freq: Two times a day (BID) | ORAL | Status: DC

## 2015-09-28 NOTE — Progress Notes (Signed)
   Subjective:    Patient ID: Laura Combs, female    DOB: 02/15/1981, 35 y.o.   MRN: [id number]  Migraine  This is a chronic problem. The current episode started in the past 7 days. The problem occurs constantly. The problem has been unchanged. The pain is located in the frontal region. The pain does not radiate. The pain quality is similar to prior headaches. The quality of the pain is described as aching. The pain is at a severity of 8/10. The pain is mild. Associated symptoms include blurred vision (left), nausea, phonophobia and photophobia. Pertinent negatives include no eye redness, fever or sore throat. The symptoms are aggravated by menstrual cycle. She has tried NSAIDs and ergotamines for the symptoms. The treatment provided mild relief.      Review of Systems  Constitutional: Negative.  Negative for fever.  HENT: Negative.  Negative for sore throat.   Eyes: Positive for blurred vision (left) and photophobia. Negative for redness.  Respiratory: Negative.  Negative for shortness of breath.   Cardiovascular: Negative.  Negative for palpitations.  Gastrointestinal: Positive for nausea.  Endocrine: Negative.   Genitourinary: Negative.   Musculoskeletal: Negative.   Neurological: Negative.  Negative for headaches.  Hematological: Negative.   Psychiatric/Behavioral: Negative.   All other systems reviewed and are negative.      Objective:   Physical Exam  Constitutional: She is oriented to person, place, and time. She appears well-developed and well-nourished. No distress.  HENT:  Head: Normocephalic and atraumatic.  Eyes: Pupils are equal, round, and reactive to light.  Neck: Normal range of motion. Neck supple. No thyromegaly present.  Cardiovascular: Normal rate, regular rhythm, normal heart sounds and intact distal pulses.   No murmur heard. Pulmonary/Chest: Effort normal and breath sounds normal. No respiratory distress. She has no wheezes.  Abdominal: Soft. Bowel  sounds are normal. She exhibits no distension. There is no tenderness.  Musculoskeletal: Normal range of motion. She exhibits no edema or tenderness.  Neurological: She is alert and oriented to person, place, and time.  Skin: Skin is warm and dry.  Psychiatric: She has a normal mood and affect. Her behavior is normal. Judgment and thought content normal.  Vitals reviewed.   BP 153/104 mmHg  Pulse 75  Temp(Src) 98 F (36.7 C) (Oral)  Ht 5\' 3"  (1.6 m)  Wt 264 lb 9.6 oz (120.022 kg)  BMI 46.88 kg/m2  LMP 09/25/2015       Assessment & Plan:  1. Menstrual migraine without status migrainosus, not intractable -Stress management discussed -Referral to neurologists -PT to start magnesium daily - ketorolac (TORADOL) injection 60 mg; Inject 2 mLs (60 mg total) into the muscle once. - Ambulatory referral to Neurology - Magnesium 400 MG CAPS; Take 2 tablets by mouth daily.  Dispense: 180 capsule; Refill: 1 - propranolol (INDERAL) 40 MG tablet; Take 1 tablet (40 mg total) by mouth 2 (two) times daily.  Dispense: 180 tablet; Refill: 1  Jannifer Rodneyhristy Banjamin Stovall, FNP

## 2015-09-28 NOTE — Telephone Encounter (Signed)
Patient given an appointment.

## 2015-09-28 NOTE — Patient Instructions (Signed)

## 2015-09-30 ENCOUNTER — Telehealth: Payer: Self-pay | Admitting: Nurse Practitioner

## 2015-09-30 DIAGNOSIS — G43829 Menstrual migraine, not intractable, without status migrainosus: Secondary | ICD-10-CM

## 2015-09-30 NOTE — Telephone Encounter (Signed)
Pt states the Toradol helped a little Monday and then yesterday but now she is having the blurred vision again and it is back full blown. Pt aware you're not in the office today. Please advise

## 2015-10-01 NOTE — Telephone Encounter (Signed)
Pt to stop all motrin, tylenol, or Excedrin as this may be causing rebound headaches. Pt can continue Imitrex and Inderal BID.  CT scan ordered

## 2015-10-01 NOTE — Telephone Encounter (Signed)
Patient aware and verbalizes understanding. 

## 2015-10-08 ENCOUNTER — Ambulatory Visit (HOSPITAL_COMMUNITY)
Admission: RE | Admit: 2015-10-08 | Discharge: 2015-10-08 | Disposition: A | Discharge status: Home / Self Care | Payer: BLUE CROSS/BLUE SHIELD | Source: Ambulatory Visit | Attending: Family | Admitting: Family

## 2015-10-08 DIAGNOSIS — N943 Premenstrual tension syndrome: Secondary | ICD-10-CM | POA: Insufficient documentation

## 2015-10-08 DIAGNOSIS — G43829 Menstrual migraine, not intractable, without status migrainosus: Secondary | ICD-10-CM | POA: Insufficient documentation

## 2015-10-19 ENCOUNTER — Ambulatory Visit: Payer: BLUE CROSS/BLUE SHIELD | Admitting: Family

## 2015-10-20 ENCOUNTER — Encounter: Payer: Self-pay | Admitting: Family

## 2015-10-20 ENCOUNTER — Ambulatory Visit (INDEPENDENT_AMBULATORY_CARE_PROVIDER_SITE_OTHER): Payer: BLUE CROSS/BLUE SHIELD | Admitting: Family

## 2015-10-20 VITALS — BP 116/80 | HR 68 | Temp 98.5°F | Ht 63.0 in | Wt 265.0 lb

## 2015-10-20 DIAGNOSIS — G43829 Menstrual migraine, not intractable, without status migrainosus: Secondary | ICD-10-CM

## 2015-10-20 DIAGNOSIS — N943 Premenstrual tension syndrome: Secondary | ICD-10-CM | POA: Diagnosis not present

## 2015-10-20 NOTE — Patient Instructions (Signed)

## 2015-10-20 NOTE — Progress Notes (Signed)
   Subjective:    Patient ID: Laura GoresKristin Marcelli, female    DOB: [birth year], 35 y.o.   MRN: [id number]  Pt presents to the office to recheck migraine. PT was seen in the office on 09/28/15 and started on Inderal 40 mg BID and magnesium 800 mg daily. Pt has had a CT scan that was negative. Pt was also referred to neurologists. PT states she has an appt next Tuesday. Pt states her migraine is gone and has greatly improved since starting the medications! Migraine  This is a chronic problem. The current episode started more than 1 year ago. The problem occurs constantly. The problem has been unchanged. The pain is located in the frontal region. The pain does not radiate. The pain quality is similar to prior headaches. The quality of the pain is described as aching. The pain is at a severity of 8/10. The pain is mild. Associated symptoms include blurred vision (left), nausea, phonophobia and photophobia. Pertinent negatives include no eye redness, fever or sore throat. The symptoms are aggravated by menstrual cycle. She has tried NSAIDs and ergotamines for the symptoms. The treatment provided mild relief.      Review of Systems  Constitutional: Negative.  Negative for fever.  HENT: Negative.  Negative for sore throat.   Eyes: Positive for blurred vision (left) and photophobia. Negative for redness.  Respiratory: Negative.  Negative for shortness of breath.   Cardiovascular: Negative.  Negative for palpitations.  Gastrointestinal: Positive for nausea.  Endocrine: Negative.   Genitourinary: Negative.   Musculoskeletal: Negative.   Neurological: Negative.  Negative for headaches.  Hematological: Negative.   Psychiatric/Behavioral: Negative.   All other systems reviewed and are negative.      Objective:   Physical Exam  Constitutional: She is oriented to person, place, and time. She appears well-developed and well-nourished. No distress.  HENT:  Head: Normocephalic and atraumatic.  Eyes: Pupils  are equal, round, and reactive to light.  Neck: Normal range of motion. Neck supple. No thyromegaly present.  Cardiovascular: Normal rate, regular rhythm, normal heart sounds and intact distal pulses.   No murmur heard. Pulmonary/Chest: Effort normal and breath sounds normal. No respiratory distress. She has no wheezes.  Abdominal: Soft. Bowel sounds are normal. She exhibits no distension. There is no tenderness.  Musculoskeletal: Normal range of motion. She exhibits no edema or tenderness.  Neurological: She is alert and oriented to person, place, and time.  Skin: Skin is warm and dry.  Psychiatric: She has a normal mood and affect. Her behavior is normal. Judgment and thought content normal.  Vitals reviewed.    BP 116/80 mmHg  Pulse 68  Temp(Src) 98.5 F (36.9 C) (Oral)  Ht 5\' 3"  (1.6 m)  Wt 265 lb (120.203 kg)  BMI 46.95 kg/m2  LMP 09/25/2015       Assessment & Plan:  1. Menstrual migraine without status migrainosus, not intractable -Keep neurologists app on Tuesday -Continue Inderal 40 mg BID since migraines have improved!! -Stress management  -Avoid caffeine -RTO prn   Jannifer Rodneyhristy Miking Usrey, FNP

## 2015-10-27 ENCOUNTER — Encounter: Payer: Self-pay | Admitting: Neurology

## 2015-10-27 ENCOUNTER — Ambulatory Visit (INDEPENDENT_AMBULATORY_CARE_PROVIDER_SITE_OTHER): Payer: BLUE CROSS/BLUE SHIELD | Admitting: Neurology

## 2015-10-27 VITALS — BP 140/80 | HR 74 | Ht 63.0 in | Wt 266.7 lb

## 2015-10-27 DIAGNOSIS — N943 Premenstrual tension syndrome: Secondary | ICD-10-CM | POA: Diagnosis not present

## 2015-10-27 DIAGNOSIS — G43839 Menstrual migraine, intractable, without status migrainosus: Secondary | ICD-10-CM

## 2015-10-27 MED ORDER — FROVATRIPTAN SUCCINATE 2.5 MG PO TABS: ORAL_TABLET | ORAL | Status: DC

## 2015-10-27 MED ORDER — SUMATRIPTAN SUCCINATE 100 MG PO TABS: ORAL_TABLET | ORAL | Status: DC

## 2015-10-27 NOTE — Progress Notes (Signed)
NEUROLOGY CONSULTATION NOTE  Laura Combs MRN: [id number] DOB: 01/31/1981  Referring provider: Jannifer Rodney, FNP Primary care provider: Bennie Pierini, FNP  Reason for consult:  Menstrual migraines  HISTORY OF PRESENT ILLNESS: Laura Combs is a 35 year old left-handed female  who presents for migraines.  History obtained by patient, her husband and PCP note.  Imaging of head CT reviewed.  Onset:  60 or 35 years old Location:  Usually left sided but can switch sides during headache Quality:  Pressure, pounding Intensity:  8/10 Aura:  Sees movement in peripheral vision about a day prior to migraine onset Prodrome:  no Associated symptoms:  Nausea, photophobia, phonophobia, blurred vision in left eye Duration:  1 to 2 days Frequency:  Usually 5 headache days per month.   Triggers/exacerbating factors:  Chocolate, lack of sleep, hunger Relieving factors:  Laying down in cool dark quiet room with ice pack Activity:  Forces self to function  Past NSAIDS:  ibuprofen , naproxen  Past analgesics:  acetaminophen Past abortive triptans:  Relpax, Maxalt (effective), Treximet (very effective but too expensive at the time), sumatriptan NS (burns throat) Past anticonvulsant medications:  topiramate  twice daily  Current NSAIDS:  none Current analgesics:  none Current triptans:  sumatriptan  Current anti-emetic:  none Current muscle relaxants:  none Current anti-anxiolytic:  none Current sleep aide:  none Current Antihypertensive medications:  propranolol  twice daily Current Antidepressant medications:  escitalopram  Current Anticonvulsant medications:  none Current Vitamins/Herbal/Supplements:  Magnesium  Current Antihistamines/Decongestants:  none Other therapy:  none Birth control:  Nuvaring.    With the Nuvaring, she has a period every 3 months.  Headaches are particularly severe during her period and do not respond at all to  sumatriptan .  Headaches typically last from 2 to 5 days.    Caffeine:  Soda, tea, coffee Alcohol:  no Smoker:  no Diet:  Does not keep hydrated Exercise:  no Depression/stress:  Controlled  Sleep hygiene:  good Family history of headache:  Paternal grandmother has migraine  CT of head from 10/09/15 was unremarkable.  PAST MEDICAL HISTORY: Past Medical History  Diagnosis Date  . Headache(784.0)   . Hypertension     PAST SURGICAL HISTORY: Past Surgical History  Procedure Laterality Date  . Dilation and evacuation  11/03/2011    Procedure: DILATATION AND EVACUATION;  Surgeon: Levi Aland, MD;  Location: [facility];  Service: Gynecology;  Laterality: N/A;    MEDICATIONS: Current Outpatient Prescriptions on File Prior to Visit  Medication Sig Dispense Refill  . escitalopram (LEXAPRO) 10 MG tablet Take 1 tablet (10 mg total) by mouth daily. 30 tablet 5  . etonogestrel-ethinyl estradiol (NUVARING) 0.12-0.015 MG/24HR vaginal ring Place 1 each vaginally every 28 (twenty-eight) days. Insert vaginally and leave in place for 3 consecutive weeks, then remove for 1 week.    . Magnesium 400 MG CAPS Take 2 tablets by mouth daily. 180 capsule 1  . propranolol (INDERAL) 40 MG tablet Take 1 tablet (40 mg total) by mouth 2 (two) times daily. 180 tablet 1   No current facility-administered medications on file prior to visit.    ALLERGIES: No Known Allergies  FAMILY HISTORY: Family History  Problem Relation Age of Onset  . Diabetes Father   . Diabetes Maternal Aunt   . Diabetes Maternal Grandmother   . Diabetes Maternal Grandfather     SOCIAL HISTORY: Social History   Social History  . Marital Status: Married    Spouse Name: N/A  .  Number of Children: N/A  . Years of Education: N/A   Occupational History  . Not on file.   Social History Main Topics  . Smoking status: Never Smoker   . Smokeless tobacco: Never Used  . Alcohol Use: No  . Drug Use: No  . Sexual Activity: Yes     Birth Control/ Protection: None   Other Topics Concern  . Not on file   Social History Narrative    REVIEW OF SYSTEMS: Constitutional: No fevers, chills, or sweats, no generalized fatigue, change in appetite Eyes: No visual changes, double vision, eye pain Ear, nose and throat: No hearing loss, ear pain, nasal congestion, sore throat Cardiovascular: No chest pain, palpitations Respiratory:  No shortness of breath at rest or with exertion, wheezes GastrointestinaI: No nausea, vomiting, diarrhea, abdominal pain, fecal incontinence Genitourinary:  No dysuria, urinary retention or frequency Musculoskeletal:  No neck pain, back pain Integumentary: No rash, pruritus, skin lesions Neurological: as above Psychiatric: No depression, insomnia, anxiety Endocrine: No palpitations, fatigue, diaphoresis, mood swings, change in appetite, change in weight, increased thirst Hematologic/Lymphatic:  No anemia, purpura, petechiae. Allergic/Immunologic: no itchy/runny eyes, nasal congestion, recent allergic reactions, rashes  PHYSICAL EXAM: Filed Vitals:   10/27/15 1426  BP: 140/80  Pulse: 74   General: No acute distress.  Patient appears well-groomed.  Head:  Normocephalic/atraumatic Eyes:  fundi examined but not visualized Neck: supple, no paraspinal tenderness, full range of motion Back: No paraspinal tenderness Heart: regular rate and rhythm Lungs: Clear to auscultation bilaterally. Vascular: No carotid bruits. Neurological Exam: Mental status: alert and oriented to person, place, and time, recent and remote memory intact, fund of knowledge intact, attention and concentration intact, speech fluent and not dysarthric, language intact. Cranial nerves: CN I: not tested CN II: pupils equal, round and reactive to light, visual fields intact CN III, IV, VI:  full range of motion, no nystagmus, no ptosis CN V: facial sensation intact CN VII: upper and lower face symmetric CN VIII: hearing  intact CN IX, X: gag intact, uvula midline CN XI: sternocleidomastoid and trapezius muscles intact CN XII: tongue midline Bulk & Tone: normal, no fasciculations. Motor:  5/5 throughout  Sensation: temperature and vibration sensation intact. Deep Tendon Reflexes:  2+ throughout, toes downgoing.  Finger to nose testing:  Without dysmetria.  Heel to shin:  Without dysmetria.  Gait:  Normal station and stride.  Able to turn and tandem walk. Romberg negative.  IMPRESSION: Menstrually-related migraine Morbid obesity  PLAN: 1.  Continue propranolol 40mg  twice daily (she has been on it for 3 weeks and only had one migraine thus far) 2.  Increase sumatriptan 50mg  to 100mg  for typical migraines.  If still ineffective, will add naproxen. 3.  For menstrual migraines, will prescribe perimenstrual prophylaxis:  Frova 2.5mg  twice daily beginning 2 days prior to expected menses and daily for total of 6 days. 4.  Weight loss, diet, exercise, water 5.  Follow up in 3 to 4 months.  Thank you for allowing me to take part in the care of this patient.  Shon MilletAdam Jaffe, DO  CC:  Jannifer Rodneyhristy Hawks, FNP  Mary-Margaret Daphine DeutscherMartin, FNP

## 2015-10-27 NOTE — Patient Instructions (Signed)
1.  For typical migraines, take sumatriptan 100mg  at earliest onset of the headache.  May repeat dose once in 2 hours if needed (do not exceed 2 tablets in 24 hours).  If this is ineffective, contact me and I will prescribe you naproxen to take in addition to the sumatriptan.  2.  In order to treat migraines associated with your period, I am going to prescribe you frovatriptan 2.5mg  tablets.  Take 1 tablet twice daily 2 days prior to expected onset of your period and continue for 6 days and then stop.  3.  Continue propranolol 40mg  twice daily.  4.  Follow up in 4 months but call sooner with questions or concerns. 5.  Lifestyle modification is important.  Increase routine exercise, drink plenty of water, stop soda, and work on healthy diet.

## 2015-11-09 ENCOUNTER — Encounter: Payer: Self-pay | Admitting: Neurology

## 2015-12-04 ENCOUNTER — Other Ambulatory Visit: Payer: Self-pay | Admitting: Pediatrics

## 2015-12-30 ENCOUNTER — Ambulatory Visit: Payer: BLUE CROSS/BLUE SHIELD | Attending: Sports Medicine | Admitting: Physical Therapy

## 2015-12-30 DIAGNOSIS — M6281 Muscle weakness (generalized): Secondary | ICD-10-CM | POA: Diagnosis present

## 2015-12-30 DIAGNOSIS — M25572 Pain in left ankle and joints of left foot: Secondary | ICD-10-CM | POA: Insufficient documentation

## 2015-12-30 NOTE — Therapy (Signed)
[facility] [address] [place], [place], [address] Phone: [phone]   Fax:  [phone]  Physical Therapy Evaluation  Patient Details  Name: Laura Combs MRN: [id number] Date of Birth: 1980-12-21 Referring Provider: Rodolph BongAdam Kendall MD.  Encounter Date: 12/30/2015      PT End of Session - 12/30/15 1154    Visit Number 1   Number of Visits 12   Date for PT Re-Evaluation 01/27/16   PT Start Time 0910   PT Stop Time 1001   PT Time Calculation (min) 51 min   Activity Tolerance Patient tolerated treatment well   Behavior During Therapy [facility] for tasks assessed/performed      Past Medical History  Diagnosis Date  . Headache(784.0)   . Hypertension     Past Surgical History  Procedure Laterality Date  . Dilation and evacuation  11/03/2011    Procedure: DILATATION AND EVACUATION;  Surgeon: Levi AlandMark E Anderson, MD;  Location: [facility];  Service: Gynecology;  Laterality: N/A;    There were no vitals filed for this visit.       Subjective Assessment - 12/30/15 1052    Subjective I'm ready for surgery if this doesn't work.   Limitations Walking   How long can you walk comfortably? Short distances.   Patient Stated Goals Get out of pain.   Currently in Pain? Yes   Pain Score 2    Pain Location Ankle   Pain Orientation Left   Pain Descriptors / Indicators Aching;Throbbing   Pain Type Chronic pain   Pain Onset More than a month ago   Pain Frequency Constant   Aggravating Factors  Being up for longer periods of time.   Pain Relieving Factors Being off feet.            [facility] - 12/30/15 0001    Assessment   Medical Diagnosis Left peroneal tendonitis.   Referring Provider Rodolph BongAdam Kendall MD.   Onset Date/Surgical Date --  2-3 months.   Precautions   Precautions None   Restrictions   Weight Bearing Restrictions No   Balance Screen   Has the patient fallen in the past 6 months Yes   How many times? 2-3 times.   Has the patient had a decrease in activity level because of a fear of falling?  No   Is the patient reluctant to leave their home because of a fear of falling?  No   Home [employer] managern[facility]   Living Environment Private residence   Prior Function   Level of Independence Independent   ROM / Strength   AROM / PROM / Strength AROM;Strength   AROM   Overall AROM Comments Hypermobility into left ankle inversion.  Dorsiflexion to 5 degrees.   Strength   Overall Strength Comments Left ankle eversion= 3 to 3+/5.   Palpation   Palpation comment Tender posterior to left medial malleolus to base of fifth metatarsal.   Ambulation/Gait   Gait Comments Gait antalgia with decreased stance time over left LE.                   [facility] Treatment/Exercise - 12/30/15 0001    Modalities   Modalities Electrical Stimulation;Iontophoresis   Electrical Stimulation   Electrical Stimulation Location Left lateral ankle and base of fifth metatarsal.   Electrical Stimulation Action Pre-mod   Electrical Stimulation Parameters 80-150 Hz x 15 minutes.   Electrical Stimulation Goals Pain   Iontophoresis   Type of Iontophoresis Dexamethasone  Location Left lateral ankle.   Dose 5480mA-min                     PT Long Term Goals - 12/30/15 1146    PT LONG TERM GOAL #1   Title Independent with a HEP.   Time 4   Period Weeks   Status New   PT LONG TERM GOAL #2   Title 5/5 left ankle strength to increase stability for functional activites.   Time 4   Period Weeks   Status New   PT LONG TERM GOAL #3   Title Stand 30 minutes with pain not > 3/10.   Time 4   Period Weeks   Status New   PT LONG TERM GOAL #4   Title Walk a community distance with pain not > 3/10.   Time 4   Period Weeks   Status New               Plan - 12/30/15 1137    Clinical Impression Statement The patient fell downstairs 10 years ago and hurt her left ankle.  It has gotten much worse over the last 2-3  months.  There are times the pain gets so bad that that it is excuriating (10+/10) especially with prolonged walking.  Her ankel also swells a lot.   Rehab Potential Excellent   PT Frequency 3x / week   PT Duration 4 weeks  or 12 visits.   PT Treatment/Interventions ADLs/Self Care Home Management;Cryotherapy;Electrical Stimulation;Iontophoresis 4mg /ml Dexamethasone;Therapeutic exercise;Therapeutic activities;Ultrasound;Neuromuscular re-education;Patient/family education;Manual techniques;Passive range of motion   PT Next Visit Plan Left ankle eversion strengthening; STW/M and IASTM to left affected peroneal tendons and distal calf; Iontophoresis.   Consulted and Agree with Plan of Care Patient      Patient will benefit from skilled therapeutic intervention in order to improve the following deficits and impairments:  Abnormal gait, Pain, Decreased activity tolerance  Visit Diagnosis: Pain in left ankle and joints of left foot - Plan: PT plan of care cert/re-cert  Muscle weakness (generalized) - Plan: PT plan of care cert/re-cert     Problem List Patient Active Problem List   Diagnosis Date Noted  . Migraine 09/28/2015    Sneijder Bernards, [place]AD MPT 12/30/2015, 11:57 AM  [facility] [address] [place], [place], [address] Phone: [phone]   Fax:  [phone]  Name: Laura Combs MRN: [id number] Date of Birth: 1981-01-07

## 2016-01-01 ENCOUNTER — Encounter: Payer: Self-pay | Admitting: Physical Therapy

## 2016-01-01 ENCOUNTER — Ambulatory Visit: Payer: BLUE CROSS/BLUE SHIELD | Admitting: Physical Therapy

## 2016-01-01 DIAGNOSIS — M6281 Muscle weakness (generalized): Secondary | ICD-10-CM

## 2016-01-01 DIAGNOSIS — M25572 Pain in left ankle and joints of left foot: Secondary | ICD-10-CM

## 2016-01-01 NOTE — Patient Instructions (Signed)
Dorsiflexion: Resisted    Facing anchor, tubing around left foot, pull toward face.  Repeat _10___ times per set. Do _2-3___ sets per session. Do __2-3__ sessions per day.  http://orth.exer.us/8   Copyright  VHI. All rights reserved.  Plantar Flexion: Resisted    Anchor behind, tubing around left foot, press down. Repeat _10___ times per set. Do _2-3___ sets per session. Do __2-3__ sessions per day.  http://orth.exer.us/10   Copyright  VHI. All rights reserved.  Inversion: Resisted    Cross legs with right leg underneath, foot in tubing loop. Hold tubing around other foot to resist and turn foot in. Repeat __10__ times per set. Do __2-3__ sets per session. Do _2-3___ sessions per day.  http://orth.exer.us/12   Copyright  VHI. All rights reserved.  Eversion: Resisted    With right foot in tubing loop, hold tubing around other foot to resist and turn foot out. Repeat _10___ times per set. Do __2-3__ sets per session. Do _2-3___ sessions per day.  http://orth.exer.us/14   Copyright  VHI. All rights reserved.  ROM: Plantar / Dorsiflexion    With left leg relaxed, gently flex and extend ankle. Move through full range of motion. Avoid pain. (((AS NEEDED TO REDUCE SWELLING))) Repeat ____ times per set. Do ____ sets per session. Do ____ sessions per day.  http://orth.exer.us/34   Copyright  VHI. All rights reserved.  Gastroc Stretch    Stand with left foot back, leg straight, forward leg bent. Keeping heel on floor, turned slightly out, lean into wall until stretch is felt in calf. Hold _30___ seconds. Repeat __3__ times per set. Do _1___ sets per session. Do __2-3__ sessions per day.  http://orth.exer.us/26   Copyright  VHI. All rights reserved.

## 2016-01-01 NOTE — Therapy (Signed)
[facility] [address] [place], [place], [address] Phone: [phone]   Fax:  [phone]  Physical Therapy Treatment  Patient Details  Name: Laura GoresKristin Hoying MRN: [id number] Date of Birth: 12-01-1980 Referring Provider: Rodolph BongAdam Kendall MD.  Encounter Date: 01/01/2016      PT End of Session - 01/01/16 0732    Visit Number 2   Number of Visits 12   Date for PT Re-Evaluation 01/27/16   PT Start Time 0731   PT Stop Time 0818   PT Time Calculation (min) 47 min   Activity Tolerance Patient tolerated treatment well   Behavior [facility] Therapy [facility] for tasks assessed/performed      Past Medical History  Diagnosis Date  . Headache(784.0)   . Hypertension     Past Surgical History  Procedure Laterality Date  . Dilation and evacuation  11/03/2011    Procedure: DILATATION AND EVACUATION;  Surgeon: Levi AlandMark E Anderson, MD;  Location: [facility];  Service: Gynecology;  Laterality: N/A;    There were no vitals filed for this visit.      Subjective Assessment - 01/01/16 0731    Subjective Reports that her ankle is tired and hurting.   Limitations Walking   How long can you walk comfortably? Short distances.   Patient Stated Goals Get out of pain.   Currently in Pain? Yes   Pain Score 5    Pain Location Ankle   Pain Orientation Left   Pain Descriptors / Indicators Aching   Pain Type Chronic pain   Pain Onset More than a month ago            [facility] - 01/01/16 0001    Assessment   Medical Diagnosis Left peroneal tendonitis.   Next MD Visit 01/2016   Precautions   Precautions None   Restrictions   Weight Bearing Restrictions No                     [facility] Adult PT Treatment/Exercise - 01/01/16 0001    Exercises   Exercises Ankle   Modalities   Modalities Electrical Stimulation;Vasopneumatic;Iontophoresis   Programme researcher, broadcasting/film/videolectrical Stimulation   Electrical Stimulation Location L lateral ankle   Electrical Stimulation  Action Pre-Mod   Electrical Stimulation Parameters 80-150 hz x15 min   Electrical Stimulation Goals Pain;Edema   Iontophoresis   Type of Iontophoresis Dexamethasone   Location Left lateral ankle.   Dose 1580mA-min   Time 8   Vasopneumatic   Number Minutes Vasopneumatic  15 minutes   Vasopnuematic Location  Ankle   Vasopneumatic Pressure Medium   Vasopneumatic Temperature  60   Ankle Exercises: Seated   ABC's 1 rep   Heel Raises 20 reps  strain feel from L lateral foot around 5th metatarsal   Toe Raise 20 reps  Strain feeling reported   Other Seated Ankle Exercises Seated B ankle rockerboard x5 min; L ankle dynadisc DF/PF, circles x5 min total   Ankle Exercises: Supine   T-Band L ankle 4D yellow theraband strengthening x20 reps each                PT Education - 01/01/16 0804    Education provided Yes   Education Details HEP- 4D ankle strengthening with yellow theraband, ankle pumps for swelling, standing gastroc stretch   Person(s) Educated Patient   Methods Explanation;Demonstration;Verbal cues;Handout   Comprehension Verbalized understanding;Returned demonstration;Verbal cues required             PT Long Term  Goals - 12/30/15 1146    PT LONG TERM GOAL #1   Title Independent with a HEP.   Time 4   Period Weeks   Status New   PT LONG TERM GOAL #2   Title 5/5 left ankle strength to increase stability for functional activites.   Time 4   Period Weeks   Status New   PT LONG TERM GOAL #3   Title Stand 30 minutes with pain not > 3/10.   Time 4   Period Weeks   Status New   PT LONG TERM GOAL #4   Title Walk a community distance with pain not > 3/10.   Time 4   Period Weeks   Status New               Plan - 01/01/16 0806    Clinical Impression Statement Patient presented in clinic with reports of L ankle fatgue and aching upon arrival. L ankle edema noted around L lateral forefoot and lateral malleolus upon observation and comparison to R ankle.  Patient tolerated therapeutic exercises well with reports of strain with toe/heel raises, resisted eversion. Patient has observable deficit in L ankle AROM dorsiflexion upon comparison to R ankle. With edema present and a full day of work, modalities to control pain and edema were conducted today with normal response. Goals remain on-going at this time secondary to being only patient's second vist. Patient accepted new HEP with gentle strengthening, gastroc stretch and ankle pumps for edema control with no questions in regards to technique and parameters. Patient denied L ankle pain following today's treatment.   Rehab Potential Excellent   PT Frequency 3x / week   PT Duration 4 weeks   PT Treatment/Interventions ADLs/Self Care Home Management;Cryotherapy;Electrical Stimulation;Iontophoresis 4mg /ml Dexamethasone;Therapeutic exercise;Therapeutic activities;Ultrasound;Neuromuscular re-education;Patient/family education;Manual techniques;Passive range of motion   PT Next Visit Plan Continue with L ankle strengthening with manual therapy and modalities as needed for pain per MPT POC.   PT Home Exercise Plan HEP- 4D L ankle strengthening with yellow theraband, ankle pumps for edema control, standing gastroc stretch   Consulted and Agree with Plan of Care Patient      Patient will benefit from skilled therapeutic intervention in order to improve the following deficits and impairments:  Abnormal gait, Pain, Decreased activity tolerance  Visit Diagnosis: Pain in left ankle and joints of left foot  Muscle weakness (generalized)     Problem List Patient Active Problem List   Diagnosis Date Noted  . Migraine 09/28/2015    Evelene CroonKelsey M Parsons, PTA 01/01/2016, 8:49 AM  [facility] [address] [place], [place], [address] Phone: [phone]   Fax:  [phone]  Name: Laura GoresKristin Onstott MRN: [id number] Date of Birth: 1981/05/19

## 2016-01-06 ENCOUNTER — Encounter: Payer: BLUE CROSS/BLUE SHIELD | Admitting: Physical Therapy

## 2016-01-11 ENCOUNTER — Encounter: Payer: Self-pay | Admitting: Physical Therapy

## 2016-01-11 ENCOUNTER — Ambulatory Visit: Payer: BLUE CROSS/BLUE SHIELD | Attending: Sports Medicine | Admitting: Physical Therapy

## 2016-01-11 DIAGNOSIS — M6281 Muscle weakness (generalized): Secondary | ICD-10-CM | POA: Diagnosis present

## 2016-01-11 DIAGNOSIS — M25572 Pain in left ankle and joints of left foot: Secondary | ICD-10-CM | POA: Diagnosis not present

## 2016-01-11 NOTE — Therapy (Signed)
[facility] [address] [place], [place], [address] Phone: [phone]   Fax:  [phone]  Physical Therapy Treatment  Patient Details  Name: Laura Combs MRN: [id number] Date of Birth: 1981/03/29 Referring Provider: Rodolph Bong MD.  Encounter Date: 01/11/2016      PT End of Session - 01/11/16 0733    Visit Number 3   Number of Visits 12   Date for PT Re-Evaluation 01/27/16   PT Start Time 0731   PT Stop Time 0817   PT Time Calculation (min) 46 min   Activity Tolerance Patient tolerated treatment well   Behavior During Therapy [facility] for tasks assessed/performed      Past Medical History  Diagnosis Date  . Headache(784.0)   . Hypertension     Past Surgical History  Procedure Laterality Date  . Dilation and evacuation  11/03/2011    Procedure: DILATATION AND EVACUATION;  Surgeon: Levi Aland, MD;  Location: [facility];  Service: Gynecology;  Laterality: N/A;    There were no vitals filed for this visit.      Subjective Assessment - 01/11/16 0733    Subjective Reports that her ankle is okay upon arrival with no current pain. Reports that she noticed that her ankle hurts more/restless feeling upon removal of the iontoporesis patch and while patch is donned. Reports that when she goes into DF that she is leading with great toe and not the rest of her foot.   Limitations Walking   How long can you walk comfortably? Short distances.   Patient Stated Goals Get out of pain.   Currently in Pain? No/denies            [facility] PT Assessment - 01/11/16 0001    Assessment   Medical Diagnosis Left peroneal tendonitis.   Next MD Visit 01/2016   Precautions   Precautions None   Restrictions   Weight Bearing Restrictions No                     [facility] Adult PT Treatment/Exercise - 01/11/16 0001    Modalities   Modalities Electrical Stimulation;Vasopneumatic   Electrical Stimulation   Electrical Stimulation  Location L lateral ankle   Electrical Stimulation Action Pre-Mod   Electrical Stimulation Parameters 80-150 hz x15 min   Electrical Stimulation Goals Pain;Edema   Vasopneumatic   Number Minutes Vasopneumatic  15 minutes   Vasopnuematic Location  Ankle   Vasopneumatic Pressure Medium   Vasopneumatic Temperature  34   Manual Therapy   Manual Therapy Myofascial release   Myofascial Release IASTW to distolateral L ankle to decrease any adhesions that may be limiting ROM and causing pain   Ankle Exercises: Seated   ABC's 1 rep   Heel Raises 20 reps   Toe Raise 20 reps   BAPS Sitting;Level 2  x5 min   Other Seated Ankle Exercises Seated B ankle rockerboard x5 min; L ankle dynadisc DF/PF, circles x5 min total                     PT Long Term Goals - 12/30/15 1146    PT LONG TERM GOAL #1   Title Independent with a HEP.   Time 4   Period Weeks   Status New   PT LONG TERM GOAL #2   Title 5/5 left ankle strength to increase stability for functional activites.   Time 4   Period Weeks   Status New   PT LONG TERM  GOAL #3   Title Stand 30 minutes with pain not > 3/10.   Time 4   Period Weeks   Status New   PT LONG TERM GOAL #4   Title Walk a community distance with pain not > 3/10.   Time 4   Period Weeks   Status New               Plan - 01/11/16 0807    Clinical Impression Statement Patient presented in clinic with no reports of L ankle pain and with reports of irritation and restlessness with iontophoresis patch. MPT was made aware of the situation and gave orders to skip iontophoresis patch during this treatment. Patient able to complete exercises well with no reports of pain with exercises today. Patient experienced increased difficulty with L ankle seated BAPs for control today. AROM toe raises with L ankle continues to visibly limited per observation. No reports of pain with heel/toe raises today. IASTW conducted to L lateral ankle anterior and posterior to  L lateral malleolus with minimal petiche noted just superior to L lateral malleolus and just anterior from L fibula. Normal modalities response noted following removal of the modalities. Patient was educated that soreness may occur over area treated by IASTW. Patient denied L ankle pain following end of treatment.   Rehab Potential Excellent   PT Frequency 3x / week   PT Duration 4 weeks   PT Treatment/Interventions ADLs/Self Care Home Management;Cryotherapy;Electrical Stimulation;Iontophoresis 4mg /ml Dexamethasone;Therapeutic exercise;Therapeutic activities;Ultrasound;Neuromuscular re-education;Patient/family education;Manual techniques;Passive range of motion   PT Next Visit Plan Continue with L ankle strengthening with manual therapy and modalities as needed for pain per MPT POC.   PT Home Exercise Plan HEP- 4D L ankle strengthening with yellow theraband, ankle pumps for edema control, standing gastroc stretch   Consulted and Agree with Plan of Care Patient      Patient will benefit from skilled therapeutic intervention in order to improve the following deficits and impairments:  Abnormal gait, Pain, Decreased activity tolerance  Visit Diagnosis: Pain in left ankle and joints of left foot  Muscle weakness (generalized)     Problem List Patient Active Problem List   Diagnosis Date Noted  . Migraine 09/28/2015    Evelene CroonKelsey M Parsons, PTA 01/11/2016, 8:21 AM  [facility] [address] [place], [place], [address] Phone: [phone]   Fax:  [phone]  Name: Laura Combs MRN: [id number] Date of Birth: 05/07/1981

## 2016-01-13 ENCOUNTER — Ambulatory Visit: Payer: BLUE CROSS/BLUE SHIELD | Admitting: Physical Therapy

## 2016-01-13 ENCOUNTER — Encounter: Payer: Self-pay | Admitting: Physical Therapy

## 2016-01-13 DIAGNOSIS — M6281 Muscle weakness (generalized): Secondary | ICD-10-CM

## 2016-01-13 DIAGNOSIS — M25572 Pain in left ankle and joints of left foot: Secondary | ICD-10-CM

## 2016-01-13 NOTE — Therapy (Addendum)
[facility] [place], [place], [address] Phone: [phone]   Fax:  [phone]  Physical Therapy Treatment  Patient Details  Name: Laura Combs MRN: [id number] Date of Birth: Aug 29, 1980 Referring Provider: Vickki Hearing MD.  Encounter Date: 01/13/2016      PT End of Session - 01/13/16 0734    Visit Number 4   Number of Visits 12   Date for PT Re-Evaluation 01/27/16   PT Start Time 0731   PT Stop Time 0818   PT Time Calculation (min) 47 min   Activity Tolerance Patient tolerated treatment well   Behavior During Therapy [facility] for tasks assessed/performed      Past Medical History  Diagnosis Date  . Headache(784.0)   . Hypertension     Past Surgical History  Procedure Laterality Date  . Dilation and evacuation  11/03/2011    Procedure: DILATATION AND EVACUATION;  Surgeon: Olga Millers, MD;  Location: [facility];  Service: Gynecology;  Laterality: N/A;    There were no vitals filed for this visit.      Subjective Assessment - 01/13/16 0733    Subjective Reports that today she just has soreness but nothing like yesterday. Patient did not do any activity out of the ordinary to cause pain yesterday (5-6/10 yesterday.) Reported that the more pain she has the more her ankle swells.   Limitations Walking   How long can you walk comfortably? Short distances.   Patient Stated Goals Get out of pain.   Currently in Pain? Yes   Pain Score 2    Pain Location Ankle   Pain Orientation Left   Pain Descriptors / Indicators Sore   Pain Type Chronic pain   Pain Onset More than a month ago            [facility] PT Assessment - 01/13/16 0001    Assessment   Medical Diagnosis Left peroneal tendonitis.   Next MD Visit 01/2016   Precautions   Precautions None   Restrictions   Weight Bearing Restrictions No                     [facility] Adult PT Treatment/Exercise - 01/13/16 0001    Modalities   Modalities  Electrical Stimulation;Vasopneumatic   Electrical Stimulation   Electrical Stimulation Location L ankle over lateral and medial malleolus   Electrical Stimulation Action Pre-Mod   Electrical Stimulation Parameters 80-150 hz x15 min   Electrical Stimulation Goals Pain;Edema   Vasopneumatic   Number Minutes Vasopneumatic  15 minutes   Vasopnuematic Location  Ankle   Vasopneumatic Pressure Low   Vasopneumatic Temperature  34   Manual Therapy   Manual Therapy Myofascial release   Myofascial Release IASTW to distolateral L ankle to decrease any adhesions that may be limiting ROM and causing pain   Ankle Exercises: Seated   ABC's 1 rep;Other (comment)  with 1# ankle isolator   Heel Raises 20 reps   Toe Raise 20 reps   BAPS Sitting;Level 2  x3 min   Other Seated Ankle Exercises Seated B ankle rockerboard x5 min; L ankle dynadisc DF/PF, circles x3 min total   Other Seated Ankle Exercises L ankle isolator 1# DF/Ev/Inv x20 reps                     PT Long Term Goals - 12/30/15 1146    PT LONG TERM GOAL #1   Title Independent with a HEP.   Time  4   Period Weeks   Status New   PT LONG TERM GOAL #2   Title 5/5 left ankle strength to increase stability for functional activites.   Time 4   Period Weeks   Status New   PT LONG TERM GOAL #3   Title Stand 30 minutes with pain not > 3/10.   Time 4   Period Weeks   Status New   PT LONG TERM GOAL #4   Title Walk a community distance with pain not > 3/10.   Time 4   Period Weeks   Status New               Plan - 01/13/16 0804    Clinical Impression Statement Patient arrived to clinic today with low level 2/10 L ankle pain and with notable edema around both L medial and lateral malleolus. Patient experienced increased L ankle pain,edema and restless sensation yesterday per patient report. Able to complete all exercises well and tolerated ankle isolator advancement without report of increased pain. IASTW continued to  lateral L ankle with minimal petiche noted today superior to lateral malleolus. Normal modalities response noted following removal of the modaliites. No goals were achieved at this time secondary to pain, strength deficits.   Rehab Potential Excellent   PT Frequency 3x / week   PT Duration 4 weeks   PT Treatment/Interventions ADLs/Self Care Home Management;Cryotherapy;Electrical Stimulation;Iontophoresis 48m/ml Dexamethasone;Therapeutic exercise;Therapeutic activities;Ultrasound;Neuromuscular re-education;Patient/family education;Manual techniques;Passive range of motion   PT Next Visit Plan Continue with L ankle strengthening with manual therapy and modalities as needed for pain per MPT POC.   PT Home Exercise Plan HEP- 4D L ankle strengthening with yellow theraband, ankle pumps for edema control, standing gastroc stretch   Consulted and Agree with Plan of Care Patient      Patient will benefit from skilled therapeutic intervention in order to improve the following deficits and impairments:  Abnormal gait, Pain, Decreased activity tolerance  Visit Diagnosis: Pain in left ankle and joints of left foot  Muscle weakness (generalized)     Problem List Patient Active Problem List   Diagnosis Date Noted  . Migraine 09/28/2015    KWynelle Fanny PTA 01/13/2016, 8:20 AM  [facility] [address]Phone: [phone]  Fax:  [phone] Name: KTyah AcordMRN: [id number] of Birth: 426-Dec-1982 PHYSICAL THERAPY DISCHARGE SUMMARY  Visits from Start of Care: 4.  Current functional level related to goals / functional outcomes: See above.   Remaining deficits: Continued pain.   Education / Equipment: HEP.  Plan: Patient agrees to discharge.  Patient goals were not met. Patient is being discharged due to not returning since the last visit.  ?????        CMaliApplegate MPT

## 2016-01-18 ENCOUNTER — Encounter: Payer: BLUE CROSS/BLUE SHIELD | Admitting: Physical Therapy

## 2016-01-20 ENCOUNTER — Encounter: Payer: BLUE CROSS/BLUE SHIELD | Admitting: Physical Therapy

## 2016-01-23 ENCOUNTER — Other Ambulatory Visit: Payer: Self-pay | Admitting: Nurse Practitioner

## 2016-01-28 ENCOUNTER — Other Ambulatory Visit: Payer: Self-pay | Admitting: Obstetrics and Gynecology

## 2016-02-01 LAB — CYTOLOGY - PAP

## 2016-02-23 ENCOUNTER — Encounter: Payer: Self-pay | Admitting: Neurology

## 2016-02-23 ENCOUNTER — Ambulatory Visit (INDEPENDENT_AMBULATORY_CARE_PROVIDER_SITE_OTHER): Payer: BLUE CROSS/BLUE SHIELD | Admitting: Neurology

## 2016-02-23 VITALS — BP 130/82 | HR 92 | Ht 63.0 in | Wt 271.0 lb

## 2016-02-23 DIAGNOSIS — N943 Premenstrual tension syndrome: Secondary | ICD-10-CM | POA: Diagnosis not present

## 2016-02-23 DIAGNOSIS — G43829 Menstrual migraine, not intractable, without status migrainosus: Secondary | ICD-10-CM

## 2016-02-23 MED ORDER — PROPRANOLOL HCL ER 120 MG PO CP24: 120.0000 mg | ORAL_CAPSULE | Freq: Every day | ORAL | 3 refills | Status: DC

## 2016-02-23 MED ORDER — SUMATRIPTAN SUCCINATE 3 MG/0.5ML ~~LOC~~ SOAJ: 3.0000 mg | SUBCUTANEOUS | 0 refills | Status: DC

## 2016-02-23 NOTE — Patient Instructions (Signed)
Migraine Recommendations: 1.  Stop propranolol.  Start propranolol ER (Inderal LA) 120mg  daily.  Call in 4 weeks with update and we can adjust dose if needed. 2.  Take Zembrace SymTouch 6mg  at earliest onset of headache.  May repeat dose once in 1 hours if needed.  Do not exceed two injections in 24 hours. 3.  Limit use of pain relievers to no more than 2 days out of the week.  These medications include acetaminophen, ibuprofen, triptans and narcotics.  This will help reduce risk of rebound headaches. 4.  Be aware of common food triggers such as processed sweets, processed foods with nitrites (such as deli meat, hot dogs, sausages), foods with MSG, alcohol (such as wine), chocolate, certain cheeses, certain fruits (dried fruits, some citrus fruit), vinegar, diet soda. 4.  Avoid caffeine 5.  Routine exercise 6.  Proper sleep hygiene 7.  Stay adequately hydrated with water 8.  Keep a headache diary. 9.  Maintain proper stress management. 10.  Do not skip meals. 11.  Consider supplements:  Magnesium oxide 400mg  to 600mg  daily, riboflavin 400mg , Coenzyme Q 10 100mg  three times daily 12.  Follow up in 3 months.

## 2016-02-23 NOTE — Progress Notes (Signed)
NEUROLOGY FOLLOW UP OFFICE NOTE  Laura Combs [id number]  HISTORY OF PRESENT ILLNESS: Laura Combs is a 35 year old left-handed female with morbid obesity who follows up for migraines.  UPDATE: Headaches are unchanged.  Frova caused side effects.  She felt extreme head pressure which resolved when she stopped it. Intensity:  8/10 Duration:  1 to 2 days Frequency:  5 days per month Current NSAIDS:  none Current analgesics:  none Current triptans:  sumatriptan 100mg  Perimenstrual prophylaxis:  Frova 2.5mg  twice daily beginning 2 days prior to expected menses and daily for total of 6 days (stopped due to side effects) Current anti-emetic:  none Current muscle relaxants:  none Current anti-anxiolytic:  none Current sleep aide:  none Current Antihypertensive medications:  propranolol 40mg  twice daily Current Antidepressant medications:  escitalopram 10mg  Current Anticonvulsant medications:  none Current Vitamins/Herbal/Supplements:  Magnesium 800mg  Current Antihistamines/Decongestants:  none Other therapy:  none Birth control:  Nuvaring.    Her gynecologist will be adding an estrogen patch for her to wear when she has her period to see if that helps.  She does not plan to have anymore children so plan is to remove the Nuvaring and possibly undergo a hysterectomy.  HISTORY: Onset:  [birth year] or 35 years old Location:  Usually left sided but can switch sides during headache Quality:  Pressure, pounding Intensity:  8/10 Aura:  Sees movement in peripheral vision about a day prior to migraine onset Prodrome:  no Associated symptoms:  Nausea, photophobia, phonophobia, blurred vision in left eye Duration:  1 to 2 days Frequency:  Usually 5 headache days per month.   Triggers/exacerbating factors:  Chocolate, lack of sleep, hunger Relieving factors:  Laying down in cool dark quiet room with ice pack Activity:  Forces self to function   Past NSAIDS:  ibuprofen 600mg , naproxen  500mg  Past analgesics:  acetaminophen Past abortive triptans:  Relpax, Maxalt (effective), Treximet (very effective but too expensive at the time), sumatriptan NS (burns throat) Past anticonvulsant medications:  topiramate 50mg  twice daily    With the Nuvaring, she has a period every 3 months.  Headaches are particularly severe during her period and do not respond at all to sumatriptan 50mg .  Headaches typically last from 2 to 5 days.     Caffeine:  Soda, tea, coffee Alcohol:  no Smoker:  no Diet:  Does not keep hydrated Exercise:  no Depression/stress:  Controlled  Sleep hygiene:  good Family history of headache:  Paternal grandmother has migraine   CT of head from 10/09/15 was unremarkable.  PAST MEDICAL HISTORY: Past Medical History:  Diagnosis Date  . Headache(784.0)   . Hypertension     MEDICATIONS: Current Outpatient Prescriptions on File Prior to Visit  Medication Sig Dispense Refill  . escitalopram (LEXAPRO) 10 MG tablet TAKE 1 TABLET DAILY 30 tablet 0  . etonogestrel-ethinyl estradiol (NUVARING) 0.12-0.015 MG/24HR vaginal ring Place 1 each vaginally every 28 (twenty-eight) days. Insert vaginally and leave in place for 3 consecutive weeks, then remove for 1 week.    . Magnesium 400 MG CAPS Take 2 tablets by mouth daily. 180 capsule 1  . SUMAtriptan (IMITREX) 100 MG tablet Take 1 tab at earliest onset of headache.  May repeat x1 in 2 hours if headache persists or recurs.  Do not exceed 2 tabs in 24 hours 10 tablet 3   No current facility-administered medications on file prior to visit.     ALLERGIES: No Known Allergies  FAMILY HISTORY: Family History  Problem  Relation Age of Onset  . Diabetes Father   . Diabetes Maternal Aunt   . Diabetes Maternal Grandmother   . Diabetes Maternal Grandfather     SOCIAL HISTORY: Social History   Social History  . Marital status: Married    Spouse name: N/A  . Number of children: N/A  . Years of education: N/A    Occupational History  . Not on file.   Social History Main Topics  . Smoking status: Never Smoker  . Smokeless tobacco: Never Used  . Alcohol use No  . Drug use: No  . Sexual activity: Yes    Birth control/ protection: None   Other Topics Concern  . Not on file   Social History Narrative  . No narrative on file    REVIEW OF SYSTEMS: Constitutional: No fevers, chills, or sweats, no generalized fatigue, change in appetite Eyes: No visual changes, double vision, eye pain Ear, nose and throat: No hearing loss, ear pain, nasal congestion, sore throat Cardiovascular: No chest pain, palpitations Respiratory:  No shortness of breath at rest or with exertion, wheezes GastrointestinaI: No nausea, vomiting, diarrhea, abdominal pain, fecal incontinence Genitourinary:  No dysuria, urinary retention or frequency Musculoskeletal:  No neck pain, back pain Integumentary: No rash, pruritus, skin lesions Neurological: as above Psychiatric: No depression, insomnia, anxiety Endocrine: No palpitations, fatigue, diaphoresis, mood swings, change in appetite, change in weight, increased thirst Hematologic/Lymphatic:  No purpura, petechiae. Allergic/Immunologic: no itchy/runny eyes, nasal congestion, recent allergic reactions, rashes  PHYSICAL EXAM: Vitals:   02/23/16 0737  Pulse: 92  Blood Pressure:  130/82 General: No acute distress.  Patient appears well-groomed.  Morbidly obese body habitus. Head:  Normocephalic/atraumatic Eyes:  Fundi examined but not visualized Neck: supple, no paraspinal tenderness, full range of motion Heart:  Regular rate and rhythm Lungs:  Clear to auscultation bilaterally Back: No paraspinal tenderness Neurological Exam: alert and oriented to person, place, and time. Attention span and concentration intact, recent and remote memory intact, fund of knowledge intact.  Speech fluent and not dysarthric, language intact.  CN II-XII intact. Bulk and tone normal, muscle  strength 5/5 throughout.  Sensation to light touch, temperature and vibration intact.  Deep tendon reflexes 2+ throughout, toes downgoing.  Finger to nose and heel to shin testing intact.  Gait normal, Romberg negative.  IMPRESSION: Menstrually-related migraine  PLAN: 1.  Will increase propranolol to propranolol ER 120mg  daily.  She will contact me in 4 weeks with update and we can adjust dose if needed. 2.  For abortive therapy, she will try the sumatriptan 6mg  injections (Zembrace). 3.  Lifestyle modification such as weight loss 4.  Follow up in 3 months.  15 minutes spent face to face with patient, over 50% spent counseling.  Shon MilletAdam Konstantine Gervasi, DO  CC:  Mary-Margaret Daphine DeutscherMartin, FNP

## 2016-02-25 ENCOUNTER — Ambulatory Visit: Payer: BLUE CROSS/BLUE SHIELD | Admitting: Neurology

## 2016-03-01 ENCOUNTER — Ambulatory Visit: Payer: BLUE CROSS/BLUE SHIELD | Admitting: Neurology

## 2016-03-02 ENCOUNTER — Ambulatory Visit (INDEPENDENT_AMBULATORY_CARE_PROVIDER_SITE_OTHER): Payer: BLUE CROSS/BLUE SHIELD | Admitting: Family

## 2016-03-02 ENCOUNTER — Encounter: Payer: Self-pay | Admitting: Family

## 2016-03-02 VITALS — BP 126/89 | HR 72 | Temp 97.9°F | Ht 63.0 in | Wt 270.8 lb

## 2016-03-02 DIAGNOSIS — Z6841 Body Mass Index (BMI) 40.0 and over, adult: Secondary | ICD-10-CM

## 2016-03-02 DIAGNOSIS — Z111 Encounter for screening for respiratory tuberculosis: Secondary | ICD-10-CM | POA: Diagnosis not present

## 2016-03-02 DIAGNOSIS — N943 Premenstrual tension syndrome: Secondary | ICD-10-CM

## 2016-03-02 DIAGNOSIS — F411 Generalized anxiety disorder: Secondary | ICD-10-CM

## 2016-03-02 DIAGNOSIS — G43829 Menstrual migraine, not intractable, without status migrainosus: Secondary | ICD-10-CM | POA: Diagnosis not present

## 2016-03-02 NOTE — Progress Notes (Signed)
   Subjective:    Patient ID: Laura GoresKristin Leasure, female    DOB: 06-21-1981, 35 y.o.   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 PT currently taking medication for GAD, Depression and migraines. Pt states these are working well at this time. Pt denies any headache, palpitations, SOB, or edema at this time. 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    Review of Systems  Constitutional: Negative.   HENT: Negative.   Eyes: Negative.   Respiratory: Negative.  Negative for shortness of breath.   Cardiovascular: Negative.  Negative for palpitations.  Gastrointestinal: Negative.   Endocrine: Negative.   Genitourinary: Negative.   Musculoskeletal: Negative.   Neurological: Negative.  Negative for headaches.  Hematological: Negative.   Psychiatric/Behavioral: Negative.   All other systems reviewed and are negative.      Objective:   Physical Exam  Constitutional: She is oriented to person, place, and time. She appears well-developed and well-nourished. No distress.  Morbid obesity   HENT:  Head: Normocephalic and atraumatic.  Right Ear: External ear normal.  Left Ear: External ear normal.  Nose: Nose normal.  Mouth/Throat: Oropharynx is clear and moist.  Eyes: Pupils are equal, round, and reactive to light.  Neck: Normal range of motion. Neck supple. No thyromegaly present.  Cardiovascular: Normal rate, regular rhythm, normal heart sounds and intact distal pulses.   No murmur heard. Pulmonary/Chest: Effort normal and breath sounds normal. No respiratory distress. She has no wheezes.  Abdominal: Soft. Bowel sounds are normal. She exhibits no distension. There is no tenderness.  Musculoskeletal: Normal range of motion. She exhibits no edema or tenderness.  Neurological: She is alert and oriented to person, place, and time.  Skin: Skin is warm and dry.  Psychiatric: She has a normal mood and affect.  Her behavior is normal. Judgment and thought content normal.  Vitals reviewed.     BP 126/89   Pulse 72   Temp 97.9 F (36.6 C) (Oral)   Ht 5\' 3"  (1.6 m)   Wt 270 lb 12.8 oz (122.8 kg)   BMI 47.97 kg/m      Assessment & Plan:  1. Screening for tuberculosis - TB Skin Test  2. Menstrual migraine without status migrainosus, not intractable  3. GAD (generalized anxiety disorder)  4. Morbid obesity with BMI of 45.0-49.9, adult (HCC)   Continue all meds Labs pending Health Maintenance reviewed Diet and exercise encouraged RTO as needed and keep chronic follow up  Jannifer Rodneyhristy Jessup Ogas, FNP

## 2016-03-02 NOTE — Patient Instructions (Signed)
Health Maintenance, Female Adopting a healthy lifestyle and getting preventive care can go a long way to promote health and wellness. Talk with your health care provider about what schedule of regular examinations is right for you. This is a good chance for you to check in with your provider about disease prevention and staying healthy. In between checkups, there are plenty of things you can do on your own. Experts have done a lot of research about which lifestyle changes and preventive measures are most likely to keep you healthy. Ask your health care provider for more information. WEIGHT AND DIET  Eat a healthy diet  Be sure to include plenty of vegetables, fruits, low-fat dairy products, and lean protein.  Do not eat a lot of foods high in solid fats, added sugars, or salt.  Get regular exercise. This is one of the most important things you can do for your health.  Most adults should exercise for at least 150 minutes each week. The exercise should increase your heart rate and make you sweat (moderate-intensity exercise).  Most adults should also do strengthening exercises at least twice a week. This is in addition to the moderate-intensity exercise.  Maintain a healthy weight  Body mass index (BMI) is a measurement that can be used to identify possible weight problems. It estimates body fat based on height and weight. Your health care provider can help determine your BMI and help you achieve or maintain a healthy weight.  For females 20 years of age and older:   A BMI below 18.5 is considered underweight.  A BMI of 18.5 to 24.9 is normal.  A BMI of 25 to 29.9 is considered overweight.  A BMI of 30 and above is considered obese.  Watch levels of cholesterol and blood lipids  You should start having your blood tested for lipids and cholesterol at 33 years of age, then have this test every 5 years.  You may need to have your cholesterol levels checked more often if:  Your lipid  or cholesterol levels are high.  You are older than 33 years of age.  You are at high risk for heart disease.  CANCER SCREENING   Lung Cancer  Lung cancer screening is recommended for adults 55-80 years old who are at high risk for lung cancer because of a history of smoking.  A yearly low-dose CT scan of the lungs is recommended for people who:  Currently smoke.  Have quit within the past 15 years.  Have at least a 30-pack-year history of smoking. A pack year is smoking an average of one pack of cigarettes a day for 1 year.  Yearly screening should continue until it has been 15 years since you quit.  Yearly screening should stop if you develop a health problem that would prevent you from having lung cancer treatment.  Breast Cancer  Practice breast self-awareness. This means understanding how your breasts normally appear and feel.  It also means doing regular breast self-exams. Let your health care provider know about any changes, no matter how small.  If you are in your 20s or 30s, you should have a clinical breast exam (CBE) by a health care provider every 1-3 years as part of a regular health exam.  If you are 40 or older, have a CBE every year. Also consider having a breast X-ray (mammogram) every year.  If you have a family history of breast cancer, talk to your health care provider about genetic screening.  If you   are at high risk for breast cancer, talk to your health care provider about having an MRI and a mammogram every year.  Breast cancer gene (BRCA) assessment is recommended for women who have family members with BRCA-related cancers. BRCA-related cancers include:  Breast.  Ovarian.  Tubal.  Peritoneal cancers.  Results of the assessment will determine the need for genetic counseling and BRCA1 and BRCA2 testing. Cervical Cancer Your health care provider may recommend that you be screened regularly for cancer of the pelvic organs (ovaries, uterus, and  vagina). This screening involves a pelvic examination, including checking for microscopic changes to the surface of your cervix (Pap test). You may be encouraged to have this screening done every 3 years, beginning at age 21.  For women ages 30-65, health care providers may recommend pelvic exams and Pap testing every 3 years, or they may recommend the Pap and pelvic exam, combined with testing for human papilloma virus (HPV), every 5 years. Some types of HPV increase your risk of cervical cancer. Testing for HPV may also be done on women of any age with unclear Pap test results.  Other health care providers may not recommend any screening for nonpregnant women who are considered low risk for pelvic cancer and who do not have symptoms. Ask your health care provider if a screening pelvic exam is right for you.  If you have had past treatment for cervical cancer or a condition that could lead to cancer, you need Pap tests and screening for cancer for at least 20 years after your treatment. If Pap tests have been discontinued, your risk factors (such as having a new sexual partner) need to be reassessed to determine if screening should resume. Some women have medical problems that increase the chance of getting cervical cancer. In these cases, your health care provider may recommend more frequent screening and Pap tests. Colorectal Cancer  This type of cancer can be detected and often prevented.  Routine colorectal cancer screening usually begins at 33 years of age and continues through 33 years of age.  Your health care provider may recommend screening at an earlier age if you have risk factors for colon cancer.  Your health care provider may also recommend using home test kits to check for hidden blood in the stool.  A small camera at the end of a tube can be used to examine your colon directly (sigmoidoscopy or colonoscopy). This is done to check for the earliest forms of colorectal  cancer.  Routine screening usually begins at age 50.  Direct examination of the colon should be repeated every 5-10 years through 33 years of age. However, you may need to be screened more often if early forms of precancerous polyps or small growths are found. Skin Cancer  Check your skin from head to toe regularly.  Tell your health care provider about any new moles or changes in moles, especially if there is a change in a mole's shape or color.  Also tell your health care provider if you have a mole that is larger than the size of a pencil eraser.  Always use sunscreen. Apply sunscreen liberally and repeatedly throughout the day.  Protect yourself by wearing long sleeves, pants, a wide-brimmed hat, and sunglasses whenever you are outside. HEART DISEASE, DIABETES, AND HIGH BLOOD PRESSURE   High blood pressure causes heart disease and increases the risk of stroke. High blood pressure is more likely to develop in:  People who have blood pressure in the high end   of the normal range (130-139/85-89 mm Hg).  People who are overweight or obese.  People who are African American.  If you are 38-23 years of age, have your blood pressure checked every 3-5 years. If you are 61 years of age or older, have your blood pressure checked every year. You should have your blood pressure measured twice--once when you are at a hospital or clinic, and once when you are not at a hospital or clinic. Record the average of the two measurements. To check your blood pressure when you are not at a hospital or clinic, you can use:  An automated blood pressure machine at a pharmacy.  A home blood pressure monitor.  If you are between 45 years and 39 years old, ask your health care provider if you should take aspirin to prevent strokes.  Have regular diabetes screenings. This involves taking a blood sample to check your fasting blood sugar level.  If you are at a normal weight and have a low risk for diabetes,  have this test once every three years after 33 years of age.  If you are overweight and have a high risk for diabetes, consider being tested at a younger age or more often. PREVENTING INFECTION  Hepatitis B  If you have a higher risk for hepatitis B, you should be screened for this virus. You are considered at high risk for hepatitis B if:  You were born in a country where hepatitis B is common. Ask your health care provider which countries are considered high risk.  Your parents were born in a high-risk country, and you have not been immunized against hepatitis B (hepatitis B vaccine).  You have HIV or AIDS.  You use needles to inject street drugs.  You live with someone who has hepatitis B.  You have had sex with someone who has hepatitis B.  You get hemodialysis treatment.  You take certain medicines for conditions, including cancer, organ transplantation, and autoimmune conditions. Hepatitis C  Blood testing is recommended for:  Everyone born from [birth year] through 1963.  Anyone with known risk factors for hepatitis C. Sexually transmitted infections (STIs)  You should be screened for sexually transmitted infections (STIs) including gonorrhea and chlamydia if:  You are sexually active and are younger than 33 years of age.  You are older than 33 years of age and your health care provider tells you that you are at risk for this type of infection.  Your sexual activity has changed since you were last screened and you are at an increased risk for chlamydia or gonorrhea. Ask your health care provider if you are at risk.  If you do not have HIV, but are at risk, it may be recommended that you take a prescription medicine daily to prevent HIV infection. This is called pre-exposure prophylaxis (PrEP). You are considered at risk if:  You are sexually active and do not regularly use condoms or know the HIV status of your partner(s).  You take drugs by injection.  You are sexually  active with a partner who has HIV. Talk with your health care provider about whether you are at high risk of being infected with HIV. If you choose to begin PrEP, you should first be tested for HIV. You should then be tested every 3 months for as long as you are taking PrEP.  PREGNANCY   If you are premenopausal and you may become pregnant, ask your health care provider about preconception counseling.  If you may  become pregnant, take 400 to 800 micrograms (mcg) of folic acid every day.  If you want to prevent pregnancy, talk to your health care provider about birth control (contraception). OSTEOPOROSIS AND MENOPAUSE   Osteoporosis is a disease in which the bones lose minerals and strength with aging. This can result in serious bone fractures. Your risk for osteoporosis can be identified using a bone density scan.  If you are 61 years of age or older, or if you are at risk for osteoporosis and fractures, ask your health care provider if you should be screened.  Ask your health care provider whether you should take a calcium or vitamin D supplement to lower your risk for osteoporosis.  Menopause may have certain physical symptoms and risks.  Hormone replacement therapy may reduce some of these symptoms and risks. Talk to your health care provider about whether hormone replacement therapy is right for you.  HOME CARE INSTRUCTIONS   Schedule regular health, dental, and eye exams.  Stay current with your immunizations.   Do not use any tobacco products including cigarettes, chewing tobacco, or electronic cigarettes.  If you are pregnant, do not drink alcohol.  If you are breastfeeding, limit how much and how often you drink alcohol.  Limit alcohol intake to no more than 1 drink per day for nonpregnant women. One drink equals 12 ounces of beer, 5 ounces of wine, or 1 ounces of hard liquor.  Do not use street drugs.  Do not share needles.  Ask your health care provider for help if  you need support or information about quitting drugs.  Tell your health care provider if you often feel depressed.  Tell your health care provider if you have ever been abused or do not feel safe at home.   This information is not intended to replace advice given to you by your health care provider. Make sure you discuss any questions you have with your health care provider.   Document Released: 01/03/2011 Document Revised: 07/11/2014 Document Reviewed: 05/22/2013 Elsevier Interactive Patient Education [employer].

## 2016-03-04 ENCOUNTER — Ambulatory Visit: Payer: BLUE CROSS/BLUE SHIELD | Admitting: *Deleted

## 2016-03-04 DIAGNOSIS — Z111 Encounter for screening for respiratory tuberculosis: Secondary | ICD-10-CM

## 2016-03-04 LAB — TB SKIN TEST
INDURATION: 0 mm
TB SKIN TEST: NEGATIVE

## 2016-03-04 NOTE — Progress Notes (Signed)
Pt came in today to have PPD read. PPD negative.

## 2016-03-10 ENCOUNTER — Other Ambulatory Visit: Payer: Self-pay | Admitting: Nurse Practitioner

## 2016-04-09 ENCOUNTER — Ambulatory Visit (INDEPENDENT_AMBULATORY_CARE_PROVIDER_SITE_OTHER): Payer: BC Managed Care – PPO | Admitting: Family Medicine

## 2016-04-09 VITALS — BP 181/117 | HR 93 | Temp 98.2°F | Ht 63.0 in | Wt 270.0 lb

## 2016-04-09 DIAGNOSIS — R1031 Right lower quadrant pain: Secondary | ICD-10-CM

## 2016-04-09 MED ORDER — CEPHALEXIN 500 MG PO CAPS: 500.0000 mg | ORAL_CAPSULE | Freq: Four times a day (QID) | ORAL | 0 refills | Status: DC

## 2016-04-09 NOTE — Progress Notes (Signed)
BP (!) 188/135   Pulse 93   Temp 98.2 F (36.8 C) (Oral)   Ht 5\' 3"  (1.6 m)   Wt 270 lb (122.5 kg)   LMP 01/08/2016 (Approximate)   BMI 47.83 kg/m    Subjective:    Patient ID: Laura Combs, female    DOB: 1980-09-05, 35 y.o.   MRN: [id number]  HPI: Laura Combs is a 35 y.o. female presenting on 04/09/2016 for Abdominal Pain (pt here today with RLQ pain that started yesterday around 4 and has progressively gotten worse)   HPI  Right lower abdominal pain Right lower abdominal pain that started yesterday around 4 and is progressively gotten worse. She did take ibuprofen 800 mg which helped for a few hours and then it returned again and she took the ibuprofen again and it did not help that time. She has continued to have pain overnight that she rates as a 6 and 10. The pain is worsened right lower abdomen. She says she has had some nausea but no vomiting she denies any fevers or chills. She has not had any diarrhea or blood in her stool. She she has had some urinary frequency but no dysuria or hematuria or flank tenderness. The pain does kind of radiating up the right side of her abdomen a little bit but does not go anywhere else. She denies any sick contacts that she knows of. She is currently on birth control/NuvaRing and not on her menstrual period From the birth control and is unlikely that she could be pregnant. The pain has continued to worsen throughout today  Relevant past medical, surgical, family and social history reviewed and updated as indicated. Interim medical history since our last visit reviewed. Allergies and medications reviewed and updated.  Review of Systems  Constitutional: Negative for chills and fever.  HENT: Negative for congestion, ear discharge and ear pain.   Eyes: Negative for redness and visual disturbance.  Respiratory: Negative for chest tightness and shortness of breath.   Cardiovascular: Negative for chest pain and leg swelling.  Gastrointestinal:  Positive for abdominal pain. Negative for constipation, diarrhea, nausea and vomiting.  Genitourinary: Positive for frequency. Negative for decreased urine volume, difficulty urinating, dysuria, flank pain, pelvic pain, urgency, vaginal bleeding, vaginal discharge and vaginal pain.  Musculoskeletal: Negative for back pain and gait problem.  Skin: Negative for rash.  Neurological: Negative for light-headedness and headaches.  Psychiatric/Behavioral: Negative for agitation and behavioral problems.  All other systems reviewed and are negative.   Per HPI unless specifically indicated above      Medication List       Accurate as of 04/09/16  9:59 AM. Always use your most recent med list.          escitalopram 10 MG tablet Commonly known as:  LEXAPRO TAKE 1 TABLET DAILY   etonogestrel-ethinyl estradiol 0.12-0.015 MG/24HR vaginal ring Commonly known as:  NUVARING Place 1 each vaginally every 28 (twenty-eight) days. Insert vaginally and leave in place for 3 consecutive weeks, then remove for 1 week.   Magnesium 400 MG Caps Take 2 tablets by mouth daily.   propranolol ER 120 MG 24 hr capsule Commonly known as:  INDERAL LA Take 1 capsule (120 mg total) by mouth daily.   SUMAtriptan Succinate 3 MG/0.5ML Soaj Commonly known as:  ZEMBRACE SYMTOUCH Inject 3 mg into the skin as directed.          Objective:    BP (!) 188/135   Pulse 93   Temp  98.2 F (36.8 C) (Oral)   Ht 5\' 3"  (1.6 m)   Wt 270 lb (122.5 kg)   LMP 01/08/2016 (Approximate)   BMI 47.83 kg/m   Wt Readings from Last 3 Encounters:  04/09/16 270 lb (122.5 kg)  03/02/16 270 lb 12.8 oz (122.8 kg)  02/23/16 271 lb (122.9 kg)    Physical Exam  Constitutional: She is oriented to person, place, and time. She appears well-developed and well-nourished. No distress.  Eyes: Conjunctivae are normal.  Cardiovascular: Normal rate, regular rhythm, normal heart sounds and intact distal pulses.   No murmur  heard. Pulmonary/Chest: Effort normal and breath sounds normal. No respiratory distress. She has no wheezes.  Abdominal: Soft. Bowel sounds are normal. She exhibits no distension and no mass. There is no hepatosplenomegaly. There is tenderness in the right lower quadrant. There is tenderness at McBurney's point. There is no rigidity, no rebound, no guarding, no CVA tenderness and negative Murphy's sign.  Musculoskeletal: Normal range of motion. She exhibits no edema or tenderness.  Neurological: She is alert and oriented to person, place, and time. Coordination normal.  Skin: Skin is warm and dry. No rash noted. She is not diaphoretic.  Psychiatric: She has a normal mood and affect. Her behavior is normal.  Nursing note and vitals reviewed.  Urine dip: 2+ blood and 1+ leukocytes.    Assessment & Plan:   Problem List Items Addressed This Visit    None    Visit Diagnoses    Right lower quadrant abdominal pain    -  Primary   Send antibiotic for possible UTI, more concerned that it is possible appendicitis, recommended for patient to go to the emergency department if persists    Relevant Medications   cephALEXin (KEFLEX) 500 MG capsule   Other Relevant Orders   Urinalysis       Follow up plan: Return if symptoms worsen or fail to improve.  Counseling provided for all of the vaccine components Orders Placed This Encounter  Procedures  . Urinalysis    Arville Care, MD [facility] 04/09/2016, 9:59 AM

## 2016-04-11 LAB — URINALYSIS
Bilirubin, UA: NEGATIVE
Glucose, UA: NEGATIVE
Ketones, UA: NEGATIVE
Nitrite, UA: NEGATIVE
PH UA: 6 (ref 5.0–7.5)
Specific Gravity, UA: 1.025 (ref 1.005–1.030)
Urobilinogen, Ur: 0.2 mg/dL (ref 0.2–1.0)

## 2016-06-20 ENCOUNTER — Telehealth: Payer: Self-pay | Admitting: Family

## 2016-06-20 ENCOUNTER — Ambulatory Visit: Payer: BLUE CROSS/BLUE SHIELD | Admitting: Neurology

## 2016-06-20 NOTE — Telephone Encounter (Signed)
Had in October at work

## 2016-06-21 ENCOUNTER — Other Ambulatory Visit: Payer: Self-pay | Admitting: Neurology

## 2016-06-23 ENCOUNTER — Encounter: Payer: Self-pay | Admitting: Neurology

## 2016-07-11 ENCOUNTER — Other Ambulatory Visit: Payer: Self-pay | Admitting: Family

## 2016-07-11 MED ORDER — OSELTAMIVIR PHOSPHATE 75 MG PO CAPS: 75.0000 mg | ORAL_CAPSULE | Freq: Every day | ORAL | 0 refills | Status: DC

## 2016-07-11 NOTE — Telephone Encounter (Signed)
Tamiflu Prescription sent to pharmacy   

## 2016-07-11 NOTE — Telephone Encounter (Signed)
Pt aware.

## 2016-07-22 ENCOUNTER — Telehealth: Payer: Self-pay | Admitting: Nurse Practitioner

## 2016-07-22 NOTE — Telephone Encounter (Signed)
She has been seen in the last year but for acute things. Please advise

## 2016-07-25 ENCOUNTER — Ambulatory Visit (INDEPENDENT_AMBULATORY_CARE_PROVIDER_SITE_OTHER): Payer: BC Managed Care – PPO | Admitting: Nurse Practitioner

## 2016-07-25 ENCOUNTER — Encounter: Payer: Self-pay | Admitting: Nurse Practitioner

## 2016-07-25 VITALS — BP 158/104 | HR 92 | Temp 98.1°F | Ht 63.0 in | Wt 269.0 lb

## 2016-07-25 DIAGNOSIS — R7309 Other abnormal glucose: Secondary | ICD-10-CM

## 2016-07-25 DIAGNOSIS — I10 Essential (primary) hypertension: Secondary | ICD-10-CM

## 2016-07-25 MED ORDER — LISINOPRIL 20 MG PO TABS: 20.0000 mg | ORAL_TABLET | Freq: Every day | ORAL | 1 refills | Status: DC

## 2016-07-25 NOTE — Patient Instructions (Signed)
Hypertension Hypertension is another name for high blood pressure. High blood pressure forces your heart to work harder to pump blood. A blood pressure reading has two numbers, which includes a higher number over a lower number (example: 110/72). Follow these instructions at home:  Have your blood pressure rechecked by your doctor.  Only take medicine as told by your doctor. Follow the directions carefully. The medicine does not work as well if you skip doses. Skipping doses also puts you at risk for problems.  Do not smoke.  Monitor your blood pressure at home as told by your doctor. Contact a doctor if:  You think you are having a reaction to the medicine you are taking.  You have repeat headaches or feel dizzy.  You have puffiness (swelling) in your ankles.  You have trouble with your vision. Get help right away if:  You get a very bad headache and are confused.  You feel weak, numb, or faint.  You get chest or belly (abdominal) pain.  You throw up (vomit).  You cannot breathe very well. This information is not intended to replace advice given to you by your health care provider. Make sure you discuss any questions you have with your health care provider. Document Released: 12/07/2007 Document Revised: 11/26/2015 Document Reviewed: 04/12/2013 Elsevier Interactive Patient Education  2017 [employer] Inc.  

## 2016-07-25 NOTE — Progress Notes (Signed)
   Subjective:    Patient ID: Laura Combs, female    DOB: 06-08-1981, 36 y.o.   MRN: [id number]  HPI Patient comes in today to have a school form filled out. Her blood pressure is really high today and looking back through chart it has been high a lot.     Review of Systems  Constitutional: Negative.   HENT: Negative.   Respiratory: Negative.   Cardiovascular: Negative.   Gastrointestinal: Negative.   Genitourinary: Negative.   Neurological: Positive for headaches (occasional migraine).  Psychiatric/Behavioral: Negative.        Objective:   Physical Exam  Constitutional: She is oriented to person, place, and time. She appears well-developed and well-nourished. No distress.  Cardiovascular: Normal rate, regular rhythm and normal heart sounds.   Pulmonary/Chest: Effort normal and breath sounds normal.  Abdominal: Soft. Bowel sounds are normal.  Musculoskeletal: She exhibits no edema.  Neurological: She is alert and oriented to person, place, and time.  Skin: Skin is warm and dry.  Psychiatric: She has a normal mood and affect. Her behavior is normal. Judgment and thought content normal.   BP (!) 158/104 (BP Location: Left Arm, Cuff Size: Large)   Pulse 92   Temp 98.1 F (36.7 C) (Oral)   Ht _0  (1.6 m)   Wt 269 lb (122 kg)   BMI 47.65 kg/m         Assessment & Plan:  1. Essential hypertension, benign Low sodium  Keep diary of blood pressure at home - CMP14+EGFR - Lipid panel - lisinopril (PRINIVIL,ZESTRIL) 20 MG tablet; Take 1 tablet (20 mg total) by mouth daily.  Dispense: 90 tablet; Refill: 1 RTO in 3 months follow up  Loris, FNP

## 2016-07-25 NOTE — Telephone Encounter (Signed)
Pt aware and has appt

## 2016-07-25 NOTE — Telephone Encounter (Signed)
NTBS.

## 2016-07-26 ENCOUNTER — Other Ambulatory Visit: Payer: Self-pay | Admitting: *Deleted

## 2016-07-26 DIAGNOSIS — R7309 Other abnormal glucose: Secondary | ICD-10-CM

## 2016-07-26 LAB — CMP14+EGFR
ALK PHOS: 74 IU/L (ref 39–117)
ALT: 17 IU/L (ref 0–32)
AST: 21 IU/L (ref 0–40)
Albumin/Globulin Ratio: 1.4 (ref 1.2–2.2)
Albumin: 3.6 g/dL (ref 3.5–5.5)
BUN/Creatinine Ratio: 10 (ref 9–23)
BUN: 7 mg/dL (ref 6–20)
Bilirubin Total: <0.2 mg/dL (ref 0.0–1.2)
CO2: 22 mmol/L (ref 18–29)
CREATININE: 0.73 mg/dL (ref 0.57–1.00)
Calcium: 8.8 mg/dL (ref 8.7–10.2)
Chloride: 104 mmol/L (ref 96–106)
GFR calc Af Amer: 125 mL/min/{1.73_m2} (ref >59)
GFR calc non Af Amer: 109 mL/min/{1.73_m2} (ref >59)
Globulin, Total: 2.5 g/dL (ref 1.5–4.5)
Glucose: 134 mg/dL — ABNORMAL HIGH (ref 65–99)
POTASSIUM: 4.3 mmol/L (ref 3.5–5.2)
Sodium: 142 mmol/L (ref 134–144)
Total Protein: 6.1 g/dL (ref 6.0–8.5)

## 2016-07-26 LAB — LIPID PANEL
CHOL/HDL RATIO: 3.5 ratio (ref 0.0–4.4)
Cholesterol, Total: 179 mg/dL (ref 100–199)
HDL: 51 mg/dL (ref >39)
LDL Calculated: 102 mg/dL — ABNORMAL HIGH (ref 0–99)
Triglycerides: 129 mg/dL (ref 0–149)
VLDL Cholesterol Cal: 26 mg/dL (ref 5–40)

## 2016-07-26 LAB — BAYER DCA HB A1C WAIVED: HB A1C: 5.5 % (ref <7.0)

## 2016-07-26 NOTE — Addendum Note (Signed)
Addended by: Margorie JohnJOHNSON, Efren Kross M on: 07/26/2016 01:33 PM   Modules accepted: Orders

## 2016-09-22 ENCOUNTER — Other Ambulatory Visit: Payer: Self-pay | Admitting: Neurology

## 2016-10-10 ENCOUNTER — Encounter: Payer: Self-pay | Admitting: Nurse Practitioner

## 2016-10-10 ENCOUNTER — Ambulatory Visit (INDEPENDENT_AMBULATORY_CARE_PROVIDER_SITE_OTHER): Payer: BC Managed Care – PPO | Admitting: Nurse Practitioner

## 2016-10-10 VITALS — BP 129/85 | HR 78 | Temp 97.8°F | Ht 63.0 in | Wt 263.0 lb

## 2016-10-10 DIAGNOSIS — J0101 Acute recurrent maxillary sinusitis: Secondary | ICD-10-CM | POA: Diagnosis not present

## 2016-10-10 MED ORDER — BENZONATATE 100 MG PO CAPS: 100.0000 mg | ORAL_CAPSULE | Freq: Three times a day (TID) | ORAL | 0 refills | Status: DC | PRN

## 2016-10-10 MED ORDER — AMOXICILLIN 875 MG PO TABS: 875.0000 mg | ORAL_TABLET | Freq: Two times a day (BID) | ORAL | 0 refills | Status: DC

## 2016-10-10 NOTE — Patient Instructions (Signed)

## 2016-10-10 NOTE — Progress Notes (Signed)
Subjective:    Patient ID: Laura Combs, female    DOB: April 26, 1981, 36 y.o.   MRN: [id number]  HPI Patient comes in c/o cough and congestion- productive cough- started about 1 week ago.    Review of Systems  Constitutional: Positive for fatigue. Negative for chills and fever.  HENT: Positive for congestion, rhinorrhea, sinus pain, sinus pressure and sore throat. Negative for ear pain, trouble swallowing and voice change.   Eyes: Negative.   Respiratory: Positive for cough.   Cardiovascular: Negative.   Gastrointestinal: Negative.   Genitourinary: Negative.   Neurological: Negative.   Psychiatric/Behavioral: Negative.   All other systems reviewed and are negative.      Objective:   Physical Exam  Constitutional: She is oriented to person, place, and time. She appears well-developed and well-nourished. No distress.  HENT:  Right Ear: Hearing, tympanic membrane, external ear and ear canal normal.  Left Ear: Hearing, tympanic membrane, external ear and ear canal normal.  Nose: Mucosal edema and rhinorrhea present. Right sinus exhibits maxillary sinus tenderness. Left sinus exhibits maxillary sinus tenderness.  Mouth/Throat: Uvula is midline, oropharynx is clear and moist and mucous membranes are normal.  Neck: Normal range of motion. Neck supple.  Cardiovascular: Normal rate and regular rhythm.   Pulmonary/Chest: Effort normal and breath sounds normal.  Abdominal: Soft. Bowel sounds are normal.  Lymphadenopathy:    She has no cervical adenopathy.  Neurological: She is alert and oriented to person, place, and time.  Skin: Skin is warm.  Psychiatric: She has a normal mood and affect. Her behavior is normal. Judgment and thought content normal.   BP 129/85   Pulse 78   Temp 97.8 F (36.6 C) (Oral)   Ht  (1.6 m)   Wt 263 lb (119.3 kg)   BMI 46.59 kg/m       Assessment & Plan:   1. Acute recurrent maxillary sinusitis    Meds ordered this encounter  Medications   . amoxicillin (AMOXIL) 875 MG tablet    Sig: Take 1 tablet (875 mg total) by mouth 2 (two) times daily. 1 po BID    Dispense:  20 tablet    Refill:  0    Order Specific Question:   Supervising Provider    Answer:   VINCENT, CAROL L [[id number]]  . benzonatate (TESSALON PERLES) 100 MG capsule    Sig: Take 1 capsule (100 mg total) by mouth 3 (three) times daily as needed for cough.    Dispense:  20 capsule    Refill:  0    Order Specific Question:   Supervising Provider    Answer:   VINCENT, CAROL L [[id number]]   1. Take meds as prescribed 2. Use a cool mist humidifier especially during the winter months and when heat has been humid. 3. Use saline nose sprays frequently 4. Saline irrigations of the nose can be very helpful if done frequently.  * 4X daily for 1 week*  * Use of a nettie pot can be helpful with this. Follow directions with this* 5. Drink plenty of fluids 6. Keep thermostat turn down low 7.For any cough or congestion  Use plain Mucinex- regular strength or max strength is fine   * Children- consult with Pharmacist for dosing 8. For fever or aces or pains- take tylenol or ibuprofen appropriate for age and weight.  * for fevers greater than 101 orally you may alternate ibuprofen and tylenol every  3 hours.   Mary-Margaret Daphine Deutscher, FNP

## 2016-11-11 ENCOUNTER — Telehealth: Payer: Self-pay | Admitting: Nurse Practitioner

## 2016-11-11 ENCOUNTER — Other Ambulatory Visit: Payer: Self-pay | Admitting: Neurology

## 2016-11-11 MED ORDER — SUMATRIPTAN SUCCINATE 100 MG PO TABS: ORAL_TABLET | ORAL | 3 refills | Status: DC

## 2016-11-11 NOTE — Telephone Encounter (Signed)
Pt requesting refill on Imitrex Please advise

## 2016-12-08 ENCOUNTER — Ambulatory Visit: Payer: BC Managed Care – PPO | Admitting: Physician Assistant

## 2016-12-19 ENCOUNTER — Ambulatory Visit (INDEPENDENT_AMBULATORY_CARE_PROVIDER_SITE_OTHER): Payer: BC Managed Care – PPO | Admitting: Family Medicine

## 2016-12-19 ENCOUNTER — Encounter: Payer: Self-pay | Admitting: Family Medicine

## 2016-12-19 VITALS — BP 142/88 | HR 88 | Temp 98.1°F | Ht 63.0 in | Wt 265.2 lb

## 2016-12-19 DIAGNOSIS — L989 Disorder of the skin and subcutaneous tissue, unspecified: Secondary | ICD-10-CM | POA: Diagnosis not present

## 2016-12-19 MED ORDER — CEPHALEXIN 500 MG PO CAPS: 500.0000 mg | ORAL_CAPSULE | Freq: Three times a day (TID) | ORAL | 0 refills | Status: DC

## 2016-12-19 NOTE — Patient Instructions (Signed)
Great to meet you!  Take all of the antibiotics  Call or come back if you have any concerns.   Please let us know if the area is still expanding in 2 days.

## 2016-12-19 NOTE — Progress Notes (Signed)
   HPI  Patient presents today here with a red area on her left arm.  Patient states that she felt a bite on Friday, 4 days ago. She states that she looked and there is no tenderness either. The following morning she had a small, approximately 1 cm, area of erythema.  Today she has an expanding area of erythema or right forearm.  It's very itchy, it is not painful.  She does not have fever, chills, or sweats.   PMH: Smoking status noted ROS: Per HPI  Objective: BP (!) 142/88   Pulse 88   Temp 98.1 F (36.7 C) (Oral)   Ht 5\' 3"  (1.6 m)   Wt 265 lb 3.2 oz (120.3 kg)   BMI 46.98 kg/m  Gen: NAD, alert, cooperative with exam HEENT: NCAT, EOMI, PERRL CV: RRR, good S1/S2, no murmur Resp: CTABL, no wheezes, non-labored Abd: SNTND, BS present, no guarding or organomegaly Ext: No edema, warm Neuro: Alert and oriented, No gross deficits  Skin 6.5 cm x 5 cm area of erythema with central papule, another papule with less surrounding erythema more proximal in the left forearm is picture below.      Assessment and plan:  # Lesion Possibly arthropod bite, with her story is very likely that she has a spider bite. Unlikely to be a widow or recluse bite given time course of illness. Given the disparity between the 2 bite site covered with Keflex to be sure there is no infectious etiology. Continue antihistamine, ice, steroid cream if needed.    Meds ordered this encounter  Medications  . cephALEXin (KEFLEX) 500 MG capsule    Sig: Take 1 capsule (500 mg total) by mouth 3 (three) times daily.    Dispense:  21 capsule    Refill:  0    Murtis SinkSam Bradshaw, MD [facility] 12/19/2016, 3:06 PM

## 2017-01-05 ENCOUNTER — Other Ambulatory Visit: Payer: Self-pay | Admitting: Nurse Practitioner

## 2017-01-05 DIAGNOSIS — I10 Essential (primary) hypertension: Secondary | ICD-10-CM

## 2017-02-08 DIAGNOSIS — I10 Essential (primary) hypertension: Secondary | ICD-10-CM | POA: Insufficient documentation

## 2017-03-11 ENCOUNTER — Other Ambulatory Visit: Payer: Self-pay | Admitting: Nurse Practitioner

## 2017-03-11 DIAGNOSIS — I10 Essential (primary) hypertension: Secondary | ICD-10-CM

## 2017-04-26 ENCOUNTER — Other Ambulatory Visit: Payer: Self-pay | Admitting: Nurse Practitioner

## 2017-05-15 ENCOUNTER — Ambulatory Visit: Payer: BC Managed Care – PPO | Admitting: Nurse Practitioner

## 2017-05-15 ENCOUNTER — Encounter: Payer: Self-pay | Admitting: Nurse Practitioner

## 2017-05-15 VITALS — BP 137/89 | HR 98 | Temp 97.8°F | Ht 63.0 in | Wt 259.0 lb

## 2017-05-15 DIAGNOSIS — Z23 Encounter for immunization: Secondary | ICD-10-CM

## 2017-05-15 DIAGNOSIS — G43829 Menstrual migraine, not intractable, without status migrainosus: Secondary | ICD-10-CM | POA: Diagnosis not present

## 2017-05-15 MED ORDER — SUMATRIPTAN SUCCINATE 100 MG PO TABS: ORAL_TABLET | ORAL | 11 refills | Status: DC

## 2017-05-15 NOTE — Patient Instructions (Signed)

## 2017-05-15 NOTE — Progress Notes (Signed)
   Subjective:    Patient ID: Laura Combs, female    DOB: 08-01-1980, 36 y.o.   MRN: [id number]  HPI Patient comes in today for refill on migraine medication. She is currently on imitrex. She has about 2 episodes a month. Her problem is she cannot take her meds and work so she gets behind on the treatment and it takes longer for med to work.     Review of Systems  Constitutional: Negative.   HENT: Negative.   Respiratory: Negative.   Cardiovascular: Negative.   Genitourinary: Negative.   Neurological: Negative.   Psychiatric/Behavioral: Negative.   All other systems reviewed and are negative.      Objective:   Physical Exam  Constitutional: She appears well-developed and well-nourished. No distress.  Cardiovascular: Normal rate and regular rhythm.  Pulmonary/Chest: Effort normal.  Neurological: She is alert.  Skin: Skin is warm.  Psychiatric: She has a normal mood and affect. Her behavior is normal. Judgment and thought content normal.   BP 137/89   Pulse 98   Temp 97.8 F (36.6 C) (Oral)   Ht 5\' 3"  (1.6 m)   Wt 259 lb (117.5 kg)   BMI 45.88 kg/m      Assessment & Plan:   1. Menstrual migraine without status migrainosus, not intractable    Meds ordered this encounter  Medications  . SUMAtriptan (IMITREX) 100 MG tablet    Sig: TAKE 1 TABLET AT ONSET OF HEADACHE. MAX 2/24 HOURS    Dispense:  9 tablet    Refill:  11    Order Specific Question:   Supervising Provider    Answer:   Johna SheriffVINCENT, CAROL L [[id number]]   Avoid caffeine Rest RTO prn  Mary-Margaret Daphine DeutscherMartin, FNP

## 2017-06-29 ENCOUNTER — Encounter: Payer: Self-pay | Admitting: Family

## 2017-06-29 ENCOUNTER — Ambulatory Visit: Payer: BC Managed Care – PPO | Admitting: Family

## 2017-06-29 VITALS — BP 155/105 | HR 80 | Temp 98.3°F | Ht 63.0 in | Wt 260.0 lb

## 2017-06-29 DIAGNOSIS — J209 Acute bronchitis, unspecified: Secondary | ICD-10-CM | POA: Diagnosis not present

## 2017-06-29 MED ORDER — HYDROCODONE-HOMATROPINE 5-1.5 MG/5ML PO SYRP: 5.0000 mL | ORAL_SOLUTION | Freq: Three times a day (TID) | ORAL | 0 refills | Status: DC | PRN

## 2017-06-29 MED ORDER — PREDNISONE 10 MG (21) PO TBPK: ORAL_TABLET | ORAL | 0 refills | Status: DC

## 2017-06-29 NOTE — Patient Instructions (Signed)

## 2017-06-29 NOTE — Progress Notes (Signed)
   Subjective:    Patient ID: Laura GoresKristin Combs, female    DOB: Apr 26, 1981, 36 y.o.   MRN: [id number]  Cough  This is a new problem. The current episode started 1 to 4 weeks ago. The problem has been gradually worsening. The problem occurs every few minutes. The cough is non-productive. Associated symptoms include chills, a fever, myalgias, nasal congestion, shortness of breath and wheezing. Pertinent negatives include no ear congestion, ear pain, headaches or sore throat. She has tried rest and OTC cough suppressant for the symptoms. The treatment provided mild relief.      Review of Systems  Constitutional: Positive for chills and fever.  HENT: Negative for ear pain and sore throat.   Respiratory: Positive for cough, shortness of breath and wheezing.   Musculoskeletal: Positive for myalgias.  Neurological: Negative for headaches.  All other systems reviewed and are negative.      Objective:   Physical Exam  Constitutional: She is oriented to person, place, and time. She appears well-developed and well-nourished. No distress.  HENT:  Head: Normocephalic and atraumatic.  Right Ear: External ear normal.  Left Ear: External ear normal.  Nose: Mucosal edema and rhinorrhea present.  Mouth/Throat: Posterior oropharyngeal erythema present.  Eyes: Pupils are equal, round, and reactive to light.  Neck: Normal range of motion. Neck supple. No thyromegaly present.  Cardiovascular: Normal rate, regular rhythm, normal heart sounds and intact distal pulses.  No murmur heard. Pulmonary/Chest: Effort normal and breath sounds normal. No respiratory distress. She has no wheezes.  Intermittent nonproductive cough  Abdominal: Soft. Bowel sounds are normal. She exhibits no distension. There is no tenderness.  Musculoskeletal: Normal range of motion. She exhibits no edema or tenderness.  Neurological: She is alert and oriented to person, place, and time.  Skin: Skin is warm and dry.  Psychiatric: She  has a normal mood and affect. Her behavior is normal. Judgment and thought content normal.  Vitals reviewed.     BP (!) 155/105   Pulse 80   Temp 98.3 F (36.8 C) (Oral)   Ht 5\' 3"  (1.6 m)   Wt 260 lb (117.9 kg)   BMI 46.06 kg/m      Assessment & Plan:  1. Acute bronchitis, unspecified organism - Take meds as prescribed - Use a cool mist humidifier  -Force fluids -For any cough or congestion  Use plain Mucinex- regular strength or max strength is fine   * Children- consult with Pharmacist for dosing -For fever or aces or pains- take tylenol or ibuprofen appropriate for age and weight.  * for fevers greater than 101 orally you may alternate ibuprofen and tylenol every  3 hours. -Throat lozenges if help - predniSONE (STERAPRED UNI-PAK 21 TAB) 10 MG (21) TBPK tablet; Use as directed  Dispense: 21 tablet; Refill: 0 - HYDROcodone-homatropine (HYCODAN) 5-1.5 MG/5ML syrup; Take 5 mLs by mouth every 8 (eight) hours as needed for cough.  Dispense: 120 mL; Refill: 0   Jannifer Rodneyhristy Oakes Mccready, FNP

## 2017-07-03 ENCOUNTER — Other Ambulatory Visit: Payer: Self-pay | Admitting: Physician Assistant

## 2017-07-03 MED ORDER — CEFDINIR 300 MG PO CAPS: 300.0000 mg | ORAL_CAPSULE | Freq: Two times a day (BID) | ORAL | 0 refills | Status: DC

## 2017-07-10 ENCOUNTER — Telehealth: Payer: Self-pay

## 2017-07-10 ENCOUNTER — Telehealth: Payer: Self-pay | Admitting: Nurse Practitioner

## 2017-07-10 DIAGNOSIS — J209 Acute bronchitis, unspecified: Secondary | ICD-10-CM

## 2017-07-10 MED ORDER — DOXYCYCLINE HYCLATE 100 MG PO TABS: 100.0000 mg | ORAL_TABLET | Freq: Two times a day (BID) | ORAL | 0 refills | Status: DC

## 2017-07-10 NOTE — Telephone Encounter (Signed)
Patient wants to thank you for sending in the Doxycycline.  She would like to know if she can get a refill on her cough medication, Hycodan.  Please advise.

## 2017-07-10 NOTE — Telephone Encounter (Signed)
Notified patient that prescription was sent in and she will need to be seen if she does not improve

## 2017-07-10 NOTE — Telephone Encounter (Signed)
Please see below.  Patient was given Omnicef, Prednisone and Hycodan.  Please review and advise.

## 2017-07-10 NOTE — Telephone Encounter (Signed)
What symptoms do you have? Really bad cough, feeling puiny   How long have you been sick? Since 06/29/17  Have you been seen for this problem? Yes, she is just about to finish 10 day antibiotic, has taken all codeine cough med  If your provider decides to give you a prescription, which pharmacy would you [facility] for it to be sent [facility]   Patient informed that this information will be sent to the clinical staff for review and that they should receive a follow up call.

## 2017-07-10 NOTE — Telephone Encounter (Signed)
Doxycycline Prescription sent to pharmacy. If she is not improving she will need to be seen.

## 2017-07-11 MED ORDER — HYDROCODONE-HOMATROPINE 5-1.5 MG/5ML PO SYRP: 5.0000 mL | ORAL_SOLUTION | Freq: Three times a day (TID) | ORAL | 0 refills | Status: DC | PRN

## 2017-07-11 NOTE — Telephone Encounter (Signed)
Left message regarding RX 

## 2017-07-11 NOTE — Telephone Encounter (Signed)
Hycodan Prescription sent to pharmacy

## 2017-07-11 NOTE — Telephone Encounter (Signed)
PT is calling back this morning to see about getting a refill on her cough medication hycodan

## 2017-07-11 NOTE — Telephone Encounter (Signed)
Please review and advise.

## 2017-08-14 ENCOUNTER — Other Ambulatory Visit: Payer: Self-pay | Admitting: Nurse Practitioner

## 2017-08-14 DIAGNOSIS — I10 Essential (primary) hypertension: Secondary | ICD-10-CM

## 2017-11-13 ENCOUNTER — Other Ambulatory Visit: Payer: Self-pay | Admitting: Nurse Practitioner

## 2017-11-13 DIAGNOSIS — I10 Essential (primary) hypertension: Secondary | ICD-10-CM

## 2017-11-28 ENCOUNTER — Ambulatory Visit: Payer: BC Managed Care – PPO | Admitting: Nurse Practitioner

## 2017-11-28 ENCOUNTER — Encounter: Payer: Self-pay | Admitting: Nurse Practitioner

## 2017-11-28 VITALS — BP 136/72 | HR 75 | Temp 97.5°F | Ht 63.0 in | Wt 273.0 lb

## 2017-11-28 DIAGNOSIS — J069 Acute upper respiratory infection, unspecified: Secondary | ICD-10-CM

## 2017-11-28 MED ORDER — AZITHROMYCIN 250 MG PO TABS: ORAL_TABLET | ORAL | 0 refills | Status: DC

## 2017-11-28 MED ORDER — METHYLPREDNISOLONE ACETATE 80 MG/ML IJ SUSP
80.0000 mg | Freq: Once | INTRAMUSCULAR | Status: AC
  Administered 2017-11-28: 80 mg via INTRAMUSCULAR

## 2017-11-28 MED ORDER — HYDROCODONE-HOMATROPINE 5-1.5 MG/5ML PO SYRP: 5.0000 mL | ORAL_SOLUTION | Freq: Four times a day (QID) | ORAL | 0 refills | Status: DC | PRN

## 2017-11-28 NOTE — Progress Notes (Signed)
Subjective:    Patient ID: Laura Combs, female    DOB: [birth year], 37 y.o.   MRN: [id number]   Chief Complaint: Nasal Congestion and Cough   HPI Patient come sin today c/o cough and congestion. Stated 10 day sago. Thought she was getting better but last 3 days she is much worse. Cough is keeping her up at night. She has been taking tessalon perles that was working at first but now is not helping.   Review of Systems  Constitutional: Negative for chills and fever.  HENT: Positive for congestion and rhinorrhea. Negative for sore throat and trouble swallowing.   Respiratory: Positive for cough (non productive).   Cardiovascular: Negative.   Gastrointestinal: Negative.   Genitourinary: Negative.   Neurological: Negative.   Psychiatric/Behavioral: Negative.   All other systems reviewed and are negative.      Objective:   Physical Exam  Constitutional: She is oriented to person, place, and time.  HENT:  Head: Normocephalic.  Right Ear: Hearing, tympanic membrane, external ear and ear canal normal.  Left Ear: Hearing, tympanic membrane, external ear and ear canal normal.  Nose: Mucosal edema and rhinorrhea present. Right sinus exhibits no maxillary sinus tenderness and no frontal sinus tenderness. Left sinus exhibits no maxillary sinus tenderness and no frontal sinus tenderness.  Mouth/Throat: Uvula is midline, oropharynx is clear and moist and mucous membranes are normal.  Eyes: Pupils are equal, round, and reactive to light. EOM are normal.  Neck: Normal range of motion. Neck supple. No JVD present. Carotid bruit is not present.  Cardiovascular: Normal rate, regular rhythm, normal heart sounds and intact distal pulses.  Pulmonary/Chest: Effort normal and breath sounds normal. No respiratory distress. She has no wheezes. She has no rales. She exhibits no tenderness.  Abdominal: Soft. Normal appearance, normal aorta and bowel sounds are normal. She exhibits no distension, no  abdominal bruit, no pulsatile midline mass and no mass. There is no splenomegaly or hepatomegaly. There is no tenderness.  Musculoskeletal: Normal range of motion. She exhibits no edema.  Lymphadenopathy:    She has no cervical adenopathy.  Neurological: She is alert and oriented to person, place, and time. She has normal reflexes.  Skin: Skin is warm and dry.  Psychiatric: She has a normal mood and affect. Her behavior is normal. Judgment and thought content normal.   BP 136/72   Pulse 75   Temp (!) 97.5 F (36.4 C) (Oral)   Ht  (1.6 m)   Wt 273 lb (123.8 kg)   BMI 48.36 kg/m       Assessment & Plan:  Laura Combs in today with chief complaint of Nasal Congestion and Cough   1. Upper respiratory infection with cough and congestion 1. Take meds as prescribed 2. Use a cool mist humidifier especially during the winter months and when heat has been humid. 3. Use saline nose sprays frequently 4. Saline irrigations of the nose can be very helpful if done frequently.  * 4X daily for 1 week*  * Use of a nettie pot can be helpful with this. Follow directions with this* 5. Drink plenty of fluids 6. Keep thermostat turn down low 7.For any cough or congestion  Use plain Mucinex- regular strength or max strength is fine   * Children- consult with Pharmacist for dosing 8. For fever or aces or pains- take tylenol or ibuprofen appropriate for age and weight.  * for fevers greater than 101 orally you may alternate ibuprofen and tylenol every  3 hours.    - azithromycin (ZITHROMAX Z-PAK) 250 MG tablet; As directed  Dispense: 6 tablet; Refill: 0 - HYDROcodone-homatropine (HYCODAN) 5-1.5 MG/5ML syrup; Take 5 mLs by mouth every 6 (six) hours as needed for cough.  Dispense: 120 mL; Refill: 0 - methylPREDNISolone acetate (DEPO-MEDROL) injection 80 mg  Laura Daphine Deutscher, FNP

## 2017-11-28 NOTE — Patient Instructions (Signed)

## 2017-12-29 ENCOUNTER — Telehealth: Payer: Self-pay | Admitting: Nurse Practitioner

## 2018-01-24 ENCOUNTER — Other Ambulatory Visit: Payer: Self-pay

## 2018-01-24 ENCOUNTER — Encounter: Payer: Self-pay | Admitting: Obstetrics and Gynecology

## 2018-01-24 ENCOUNTER — Ambulatory Visit: Payer: BC Managed Care – PPO | Admitting: Obstetrics and Gynecology

## 2018-01-24 VITALS — BP 128/74 | HR 88 | Resp 16 | Ht 62.25 in | Wt 274.0 lb

## 2018-01-24 DIAGNOSIS — N393 Stress incontinence (female) (male): Secondary | ICD-10-CM | POA: Diagnosis not present

## 2018-01-24 DIAGNOSIS — N816 Rectocele: Secondary | ICD-10-CM | POA: Diagnosis not present

## 2018-01-24 DIAGNOSIS — R102 Pelvic and perineal pain: Secondary | ICD-10-CM

## 2018-01-24 DIAGNOSIS — N8110 Cystocele, unspecified: Secondary | ICD-10-CM

## 2018-01-24 DIAGNOSIS — Z6841 Body Mass Index (BMI) 40.0 and over, adult: Secondary | ICD-10-CM

## 2018-01-24 LAB — POCT URINALYSIS DIPSTICK
Bilirubin, UA: NEGATIVE
Glucose, UA: NEGATIVE
KETONES UA: NEGATIVE
NITRITE UA: NEGATIVE
PH UA: 5 (ref 5.0–8.0)
PROTEIN UA: NEGATIVE
UROBILINOGEN UA: 0.2 U/dL

## 2018-01-24 NOTE — Progress Notes (Signed)
ctGYNECOLOGY  VISIT   HPI: 37 y.o.   Married  Caucasian  female   [phone] with Patient's last menstrual period was 08/04/2017.   Here as a new patient for stress incontinence. Patient referred from Dr. Dareen Piano at [facility] -- has seen Dr. Edward Jolly in the past.  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 She does have a consultation with [facility] for next week also.   Husband present for entire visit.   Having urinary incontinence since childbirth completed.  Now having enuresis.  Leaks with cough, sneeze, and laugh. Leaks with sex also.   UTIs frequently.  Occur 3 - 4 per year.  Has hx renal stones 2017.   Occasional constipation.  Uses Miralax.  Does splinting 2 - 3 times per month.  No fecal soiling.   Has completed childbearing.   Has migraine headaches during her menses and has PMS, so she tries to avoid her cycles.  Feels pressure like her uterus is falling out.  Uses NuvaRing back to back until she starts spotting.  Extra absorbant pad change 3 - 4 times per day.   No prior ultrasound.   No pelvic pressure or bulge.  Sexually active.   Has lost about 30 pounds in the past doing Weight Watchers. No real exercise currently.   Children and 5 and 8.   School begins August 19.   GYNECOLOGIC HISTORY: Patient's last menstrual period was 08/04/2017. Contraception:  NuvaRing Menopausal hormone therapy:  n/a Last mammogram:  n/a Last pap smear:   02/08/17 Pap and HR HPV Negative        OB History    Gravida  3   Para  2   Term  2   Preterm  0   AB  1   Living  2     SAB  1   TAB  0   Ectopic  0   Multiple  0   Live Births  2              Patient Active Problem List   Diagnosis Date Noted  . GAD (generalized anxiety disorder) 03/02/2016  . Morbid obesity with BMI of 45.0-49.9, adult (HCC) 03/02/2016  . Migraine 09/28/2015    Past Medical History:  Diagnosis Date  .  Headache(784.0)   . History of kidney stones   . Hypertension   . Urinary incontinence     Past Surgical History:  Procedure Laterality Date  . DILATION AND EVACUATION  11/03/2011   Procedure: DILATATION AND EVACUATION;  Surgeon: Levi Aland, MD;  Location: [facility];  Service: Gynecology;  Laterality: N/A;    Current Outpatient Medications  Medication Sig Dispense Refill  . escitalopram (LEXAPRO) 10 MG tablet     . etonogestrel-ethinyl estradiol (NUVARING) 0.12-0.015 MG/24HR vaginal ring Place 1 each vaginally every 28 (twenty-eight) days. Insert vaginally and leave in place for 3 consecutive weeks, then remove for 1 week.    [name] lisinopril (PRINIVIL,ZESTRIL) 20 MG tablet Take 1 tablet (20 mg total) by mouth daily. 90 tablet 0  . Multiple Vitamin (MULTIVITAMIN) tablet Take 1 tablet by mouth daily.    . SUMAtriptan (IMITREX) 100 MG tablet TAKE 1 TABLET AT ONSET OF HEADACHE. MAX 2/24 HOURS 9 tablet 11   No current facility-administered medications for this visit.      ALLERGIES: Patient has no known allergies.  Family History  Problem Relation Age of Onset  .  Diabetes Father   . Hypertension Father   . Hyperlipidemia Father   . Diabetes Maternal Aunt   . Diabetes Maternal Grandmother   . Diabetes Maternal Grandfather     Social History   Socioeconomic History  . Marital status: Married    Spouse name: Not on file  . Number of children: Not on file  . Years of education: Not on file  . Highest education level: Not on file  Occupational History  . Not on file  Social Needs  . Financial resource strain: Not on file  . Food insecurity:    Worry: Not on file    Inability: Not on file  . Transportation needs:    Medical: Not on file    Non-medical: Not on file  Tobacco Use  . Smoking status: Never Smoker  . Smokeless tobacco: Never Used  Substance and Sexual Activity  . Alcohol use: No  . Drug use: No  . Sexual activity: Yes    Birth control/protection: Inserts     Comment: NuvaRing  Lifestyle  . Physical activity:    Days per week: Not on file    Minutes per session: Not on file  . Stress: Not on file  Relationships  . Social connections:    Talks on phone: Not on file    Gets together: Not on file    Attends religious service: Not on file    Active member of club or organization: Not on file    Attends meetings of clubs or organizations: Not on file    Relationship status: Not on file  . Intimate partner violence:    Fear of current or ex partner: Not on file    Emotionally abused: Not on file    Physically abused: Not on file    Forced sexual activity: Not on file  Other Topics Concern  . Not on file  Social History Narrative  . Not on file    Review of Systems  Constitutional: Negative.   HENT: Negative.   Eyes: Negative.   Respiratory: Negative.   Cardiovascular: Negative.   Gastrointestinal: Negative.   Endocrine: Negative.   Genitourinary:       Loss of urine spontaneously Loss of urine with cough or sneeze  Musculoskeletal: Negative.   Skin: Negative.   Allergic/Immunologic: Negative.   Neurological: Negative.   Hematological: Negative.   Psychiatric/Behavioral: Negative.     PHYSICAL EXAMINATION:    BP 128/74 (BP Location: Right Arm, Patient Position: Sitting, Cuff Size: Large)   Pulse 88   Resp 16   Ht 5' 2.25" (1.581 m)   Wt 274 lb (124.3 kg)   LMP 08/04/2017 Comment: NuvaRing  BMI 49.71 kg/m     General appearance: alert, cooperative and appears stated age Head: Normocephalic, without obvious abnormality, atraumatic Neck: no adenopathy, supple, symmetrical, trachea midline and thyroid normal to inspection and palpation Lungs: clear to auscultation bilaterally Breasts: normal appearance, no masses or tenderness, No nipple retraction or dimpling, No nipple discharge or bleeding, No axillary or supraclavicular adenopathy Heart: regular rate and rhythm Abdomen: soft, non-tender, no masses,  no  organomegaly Extremities: extremities normal, atraumatic, no cyanosis or edema Skin: Skin color, texture, turgor normal. No rashes or lesions Lymph nodes: Cervical, supraclavicular, and axillary nodes normal. No abnormal inguinal nodes palpated Neurologic: Grossly normal  Pelvic: External genitalia:  no lesions              Urethra:  normal appearing urethra with no masses, tenderness or lesions  Bartholins and Skenes: normal                 Vagina: normal appearing vagina with normal color and discharge, no lesions.  Second degree cystocele and first degree rectocele.               Cervix: no lesions                Bimanual Exam:  Uterus:  normal size, contour, position, consistency, mobility, non-tender              Adnexa: no mass, fullness, tenderness              Rectal exam: Yes.  .  Confirms.              Anus:  normal sphincter tone, no lesions  Chaperone was present for exam.  ASSESSMENT  GSI.  Enuresis.  Dysmenorrhea/pelvic pressure.  Second degree cystocele.  First degree rectocele.   Recurrent UTIs. Constipation with splinting.  Desire for permanent sterilization.  Obesity. BMI 49.  PLAN  I have had a comprehensive discussion with the patient regarding prolapse, urinary incontinence, and dysmenorrhea.   I have provided reading materials from [employer] regarding prolapse and incontinence.    We talked about observational management, physical therapy, pessary use, weight loss, and surgical care which may include hysterectomy with bilateral salpingectomy, anterior and posterior colporrhaphy, cystoscopy.    At this junction, I am recommending she does not proceed with surgery until she has a pelvic ultrasound, weight loss, and urodynamic studies.  We talked about weight loss reducing risks related to surgery and improving outcome from her prolapse and incontinence care.  We reviewed education through a dietician, Weight Watchers, and weight loss surgery.   She will join [employer].    Urine micro and culture sent.    Questions invited and answered. She is really motivated to have the best outcome possible from her care and may pursue surgery next summer. Patient and husband are satisfied with this plan at this time.   She will decide if she wants to proceed with the second opinion from the urology team.   An After Visit Summary was printed and given to the patient.  ___60___ minutes face to face time of which over 50% was spent in counseling.

## 2018-01-25 LAB — URINALYSIS, MICROSCOPIC ONLY: CASTS: NONE SEEN /LPF

## 2018-01-26 LAB — URINE CULTURE

## 2018-02-08 ENCOUNTER — Ambulatory Visit (INDEPENDENT_AMBULATORY_CARE_PROVIDER_SITE_OTHER): Payer: BC Managed Care – PPO

## 2018-02-08 ENCOUNTER — Encounter: Payer: Self-pay | Admitting: Obstetrics and Gynecology

## 2018-02-08 ENCOUNTER — Ambulatory Visit (INDEPENDENT_AMBULATORY_CARE_PROVIDER_SITE_OTHER): Payer: BC Managed Care – PPO | Admitting: Obstetrics and Gynecology

## 2018-02-08 ENCOUNTER — Other Ambulatory Visit: Payer: Self-pay

## 2018-02-08 VITALS — BP 128/80 | Ht 62.25 in | Wt 271.0 lb

## 2018-02-08 DIAGNOSIS — R102 Pelvic and perineal pain: Secondary | ICD-10-CM | POA: Diagnosis not present

## 2018-02-08 DIAGNOSIS — N812 Incomplete uterovaginal prolapse: Secondary | ICD-10-CM | POA: Diagnosis not present

## 2018-02-08 DIAGNOSIS — Z6841 Body Mass Index (BMI) 40.0 and over, adult: Secondary | ICD-10-CM | POA: Diagnosis not present

## 2018-02-08 DIAGNOSIS — R32 Unspecified urinary incontinence: Secondary | ICD-10-CM

## 2018-02-08 NOTE — Progress Notes (Signed)
Encounter reviewed by Dr. Brook Amundson C. Silva.  

## 2018-02-08 NOTE — Progress Notes (Signed)
GYNECOLOGY  VISIT   HPI: 37 y.o.   Married  Caucasian  female   [phone] with No LMP recorded. (Menstrual status: Other).   here for   Pelvic US for pelvic pressure.  Husband here for the visit.   Using NuvaRing continuously.  Does not want to deal with her menstruation any longer.  No cycle since February.  Does allow herself to have a cycle periodically.  This prevents her from having menstrual headaches.  Doing weight loss on her own.  Lost 3 pounds.   Wants to revisit next year for her pelvic floor issues.   GYNECOLOGIC HISTORY: No LMP recorded. (Menstrual status: Other). Contraception:  nuvaring Menopausal hormone therapy:  n/a Last mammogram:  n/a Last pap smear:   02/18/17 pap and HR HPV negative        OB History    Gravida  3   Para  2   Term  2   Preterm  0   AB  1   Living  2     SAB  1   TAB  0   Ectopic  0   Multiple  0   Live Births  2              Patient Active Problem List   Diagnosis Date Noted  . Hypertensive disorder 02/08/2017  . GAD (generalized anxiety disorder) 03/02/2016  . Morbid obesity with BMI of 45.0-49.9, adult (HCC) 03/02/2016  . Migraine 09/28/2015    Past Medical History:  Diagnosis Date  . Headache(784.0)   . History of kidney stones   . Hypertension   . Urinary incontinence     Past Surgical History:  Procedure Laterality Date  . DILATION AND EVACUATION  11/03/2011   Procedure: DILATATION AND EVACUATION;  Surgeon: Levi Aland, MD;  Location: [facility];  Service: Gynecology;  Laterality: N/A;    Current Outpatient Medications  Medication Sig Dispense Refill  . escitalopram (LEXAPRO) 10 MG tablet     . etonogestrel-ethinyl estradiol (NUVARING) 0.12-0.015 MG/24HR vaginal ring Place 1 each vaginally every 28 (twenty-eight) days. Insert vaginally and leave in place for 3 consecutive weeks, then remove for 1 week.    [name] lisinopril (PRINIVIL,ZESTRIL) 20 MG tablet Take 1 tablet (20 mg total) by mouth daily. 90  tablet 0  . Multiple Vitamin (MULTIVITAMIN) tablet Take 1 tablet by mouth daily.    . SUMAtriptan (IMITREX) 100 MG tablet TAKE 1 TABLET AT ONSET OF HEADACHE. MAX 2/24 HOURS 9 tablet 11   No current facility-administered medications for this visit.      ALLERGIES: Patient has no known allergies.  Family History  Problem Relation Age of Onset  . Diabetes Father   . Hypertension Father   . Hyperlipidemia Father   . Diabetes Maternal Aunt   . Diabetes Maternal Grandmother   . Diabetes Maternal Grandfather     Social History   Socioeconomic History  . Marital status: Married    Spouse name: Not on file  . Number of children: Not on file  . Years of education: Not on file  . Highest education level: Not on file  Occupational History  . Not on file  Social Needs  . Financial resource strain: Not on file  . Food insecurity:    Worry: Not on file    Inability: Not on file  . Transportation needs:    Medical: Not on file    Non-medical: Not on file  Tobacco Use  . Smoking status:  Never Smoker  . Smokeless tobacco: Never Used  Substance and Sexual Activity  . Alcohol use: No  . Drug use: No  . Sexual activity: Yes    Birth control/protection: Inserts    Comment: NuvaRing  Lifestyle  . Physical activity:    Days per week: Not on file    Minutes per session: Not on file  . Stress: Not on file  Relationships  . Social connections:    Talks on phone: Not on file    Gets together: Not on file    Attends religious service: Not on file    Active member of club or organization: Not on file    Attends meetings of clubs or organizations: Not on file    Relationship status: Not on file  . Intimate partner violence:    Fear of current or ex partner: Not on file    Emotionally abused: Not on file    Physically abused: Not on file    Forced sexual activity: Not on file  Other Topics Concern  . Not on file  Social History Narrative  . Not on file    Review of Systems   Constitutional: Negative.   HENT: Negative.   Eyes: Negative.   Respiratory: Negative.   Cardiovascular: Negative.   Gastrointestinal: Negative.   Endocrine: Negative.   Genitourinary: Positive for menstrual problem and vaginal bleeding.       Loss of urine spontaneously, with sneeze or cough nocturia  Musculoskeletal: Negative.   Skin: Negative.   Allergic/Immunologic: Negative.   Neurological: Negative.   Hematological: Negative.   Psychiatric/Behavioral: Negative.   All other systems reviewed and are negative.   PHYSICAL EXAMINATION:    BP 128/80   Ht 5' 2.25" (1.581 m)   Wt 271 lb (122.9 kg)   BMI 49.17 kg/m     General appearance: alert, cooperative and appears stated age   Pelvic US -  Normal uterus and ovaries.  No free fluid.  ASSESSMENT  Pelvic pressure and dysmenorrhea.  Controlled with continuous NuvaRing.  Incomplete uterovaginal prolapse.  Urinary incontinence.  Obesity.   PLAN  Reassurance regarding anatomy.  Continue with NuvaRing continuously. Referral to [facility] for pelvic floor therapy.  We discussed weight loss program - Mediterranean diet and exercise.  Will return next year to discuss her progress and desire or not for surgery at that time.   An After Visit Summary was printed and given to the patient.  ___25___ minutes face to face time of which over 50% was spent in counseling.

## 2018-02-15 ENCOUNTER — Other Ambulatory Visit: Payer: Self-pay | Admitting: Nurse Practitioner

## 2018-02-15 DIAGNOSIS — I10 Essential (primary) hypertension: Secondary | ICD-10-CM

## 2018-04-23 ENCOUNTER — Encounter: Payer: Self-pay | Admitting: Family Medicine

## 2018-04-23 ENCOUNTER — Ambulatory Visit: Payer: BC Managed Care – PPO | Admitting: Family Medicine

## 2018-04-23 VITALS — BP 119/81 | HR 83 | Temp 97.7°F | Ht 62.25 in | Wt 276.0 lb

## 2018-04-23 DIAGNOSIS — J069 Acute upper respiratory infection, unspecified: Secondary | ICD-10-CM | POA: Diagnosis not present

## 2018-04-23 DIAGNOSIS — J0110 Acute frontal sinusitis, unspecified: Secondary | ICD-10-CM | POA: Diagnosis not present

## 2018-04-23 MED ORDER — METHYLPREDNISOLONE ACETATE 80 MG/ML IJ SUSP
80.0000 mg | Freq: Once | INTRAMUSCULAR | Status: AC
  Administered 2018-04-23: 80 mg via INTRAMUSCULAR

## 2018-04-23 MED ORDER — PSEUDOEPH-BROMPHEN-DM 30-2-10 MG/5ML PO SYRP: 10.0000 mL | ORAL_SOLUTION | Freq: Four times a day (QID) | ORAL | 0 refills | Status: AC | PRN

## 2018-04-23 MED ORDER — AMOXICILLIN-POT CLAVULANATE 875-125 MG PO TABS: 1.0000 | ORAL_TABLET | Freq: Two times a day (BID) | ORAL | 0 refills | Status: AC

## 2018-04-23 MED ORDER — FLUTICASONE PROPIONATE 50 MCG/ACT NA SUSP: 2.0000 | Freq: Every day | NASAL | 6 refills | Status: DC

## 2018-04-23 NOTE — Patient Instructions (Signed)

## 2018-04-23 NOTE — Progress Notes (Signed)
Subjective:    Patient ID: Laura Combs, female    DOB: Feb 11, 1981, 37 y.o.   MRN: [id number]  Chief Complaint:  Chest congestion, cough (worse in the mornings and at bedtime, symptoms began 2 weeks ago)   HPI: Laura Combs is a 37 y.o. female presenting on 04/23/2018 for Chest congestion, cough (worse in the mornings and at bedtime, symptoms began 2 weeks ago)  Pt presents today with 2 weeks of cough, congestion, sinus pressure, and postnasal drainage. Pt states she has tried several over the counter remedies without relief of symptoms. Pt state the sinus pressure and cough is worse and night and in the morning. States she has not had a fever or chills. Denies shortness of breath, chest pain, palpitations, or dizziness. Has had fatigue.   Relevant past medical, surgical, family, and social history reviewed and updated as indicated.  Allergies and medications reviewed and updated.   Past Medical History:  Diagnosis Date  . Headache(784.0)   . History of kidney stones   . Hypertension   . Urinary incontinence     Past Surgical History:  Procedure Laterality Date  . DILATION AND EVACUATION  11/03/2011   Procedure: DILATATION AND EVACUATION;  Surgeon: Levi Aland, MD;  Location: [facility];  Service: Gynecology;  Laterality: N/A;    Social History   Socioeconomic History  . Marital status: Married    Spouse name: Not on file  . Number of children: Not on file  . Years of education: Not on file  . Highest education level: Not on file  Occupational History  . Not on file  Social Needs  . Financial resource strain: Not on file  . Food insecurity:    Worry: Not on file    Inability: Not on file  . Transportation needs:    Medical: Not on file    Non-medical: Not on file  Tobacco Use  . Smoking status: Never Smoker  . Smokeless tobacco: Never Used  Substance and Sexual Activity  . Alcohol use: No  . Drug use: No  . Sexual activity: Yes    Birth  control/protection: Inserts    Comment: NuvaRing  Lifestyle  . Physical activity:    Days per week: Not on file    Minutes per session: Not on file  . Stress: Not on file  Relationships  . Social connections:    Talks on phone: Not on file    Gets together: Not on file    Attends religious service: Not on file    Active member of club or organization: Not on file    Attends meetings of clubs or organizations: Not on file    Relationship status: Not on file  . Intimate partner violence:    Fear of current or ex partner: Not on file    Emotionally abused: Not on file    Physically abused: Not on file    Forced sexual activity: Not on file  Other Topics Concern  . Not on file  Social History Narrative  . Not on file    Outpatient Encounter Medications as of 04/23/2018  Medication Sig  . escitalopram (LEXAPRO) 10 MG tablet   . etonogestrel-ethinyl estradiol (NUVARING) 0.12-0.015 MG/24HR vaginal ring Place 1 each vaginally every 28 (twenty-eight) days. Insert vaginally and leave in place for 3 consecutive weeks, then remove for 1 week.  [name] lisinopril (PRINIVIL,ZESTRIL) 20 MG tablet TAKE 1 TABLET DAILY  . Multiple Vitamin (MULTIVITAMIN) tablet Take 1 tablet by  mouth daily.  . SUMAtriptan (IMITREX) 100 MG tablet TAKE 1 TABLET AT ONSET OF HEADACHE. MAX 2/24 HOURS  . amoxicillin-clavulanate (AUGMENTIN) 875-125 MG tablet Take 1 tablet by mouth 2 (two) times daily for 7 days.  . brompheniramine-pseudoephedrine-DM 30-2-10 MG/5ML syrup Take 10 mLs by mouth 4 (four) times daily as needed for up to 5 days.  . fluticasone (FLONASE) 50 MCG/ACT nasal spray Place 2 sprays into both nostrils daily.   Facility-Administered Encounter Medications as of 04/23/2018  Medication  . methylPREDNISolone acetate (DEPO-MEDROL) injection 80 mg    No Known Allergies  Review of Systems  Constitutional: Positive for activity change and fatigue. Negative for chills and fever.  HENT: Positive for congestion,  ear pain (pressure), postnasal drip, rhinorrhea, sinus pressure, sinus pain and sore throat.   Eyes: Negative for visual disturbance.  Respiratory: Positive for cough. Negative for chest tightness and shortness of breath.   Cardiovascular: Negative for chest pain and palpitations.  Gastrointestinal: Negative for abdominal pain, diarrhea, nausea and vomiting.  Musculoskeletal: Negative for arthralgias and myalgias.  Skin: Negative for rash.  Neurological: Positive for headaches. Negative for dizziness and light-headedness.  Psychiatric/Behavioral: Negative for confusion.        Objective:    BP 119/81   Pulse 83   Temp 97.7 F (36.5 C) (Oral)   Ht 5' 2.25" (1.581 m)   Wt 276 lb (125.2 kg)   BMI 50.08 kg/m    Wt Readings from Last 3 Encounters:  04/23/18 276 lb (125.2 kg)  02/08/18 271 lb (122.9 kg)  01/24/18 274 lb (124.3 kg)    Physical Exam  Constitutional: She is oriented to person, place, and time. She appears well-developed and well-nourished. She is cooperative. No distress.  HENT:  Head: Normocephalic.  Right Ear: Hearing, external ear and ear canal normal. Tympanic membrane is not injected and not erythematous. A middle ear effusion is present.  Left Ear: Hearing, external ear and ear canal normal. Tympanic membrane is not injected and not erythematous. A middle ear effusion is present.  Nose: Mucosal edema and rhinorrhea present. Right sinus exhibits frontal sinus tenderness. Right sinus exhibits no maxillary sinus tenderness. Left sinus exhibits frontal sinus tenderness. Left sinus exhibits no maxillary sinus tenderness.  Mouth/Throat: Uvula is midline and mucous membranes are normal. Posterior oropharyngeal erythema (postnasal drainage) present. No oropharyngeal exudate, posterior oropharyngeal edema or tonsillar abscesses. No tonsillar exudate.  Eyes: Pupils are equal, round, and reactive to light. Conjunctivae and lids are normal.  Neck: Trachea normal, normal range  of motion and phonation normal. Neck supple.  Cardiovascular: Normal rate, regular rhythm, S1 normal, S2 normal and normal heart sounds. Exam reveals no gallop and no friction rub.  No murmur heard. Pulmonary/Chest: Effort normal and breath sounds normal. No respiratory distress.  Lymphadenopathy:    She has cervical adenopathy (posterior).  Neurological: She is alert and oriented to person, place, and time.  Skin: Skin is warm and dry. Capillary refill takes less than 2 seconds.  Psychiatric: She has a normal mood and affect. Her speech is normal and behavior is normal. Judgment and thought content normal. Cognition and memory are normal.  Nursing note and vitals reviewed.        Pertinent labs & imaging results that were available during my care of the patient were reviewed by me and considered in my medical decision making.  Assessment & Plan:  Melita was seen today for chest congestion, cough.  Diagnoses and all orders for this visit:  Acute  non-recurrent frontal sinusitis 1. Medications as prescribed.  2. Continue daily Zyrtec.  3. Increase fluid intake.  -     amoxicillin-clavulanate (AUGMENTIN) 875-125 MG tablet; Take 1 tablet by mouth 2 (two) times daily for 7 days. -     fluticasone (FLONASE) 50 MCG/ACT nasal spray; Place 2 sprays into both nostrils daily. -     methylPREDNISolone acetate (DEPO-MEDROL) injection 80 mg  Upper respiratory infection with cough and congestion 1. Take meds as prescribed. 2. Use a cool mist humidifier especially during the winter months when humid outside. 3. Increase fluid intake.   -     brompheniramine-pseudoephedrine-DM 30-2-10 MG/5ML syrup; Take 10 mLs by mouth 4 (four) times daily as needed for up to 5 days.      Continue all other maintenance medications.  Follow up plan: Return if symptoms worsen or fail to improve.  Educational handout given for sinusitis   The above assessment and management plan was discussed with the  patient. The patient verbalized understanding of and has agreed to the management plan. Patient is aware to call the clinic if symptoms persist or worsen. Patient is aware when to return to the clinic for a follow-up visit. Patient educated on when it is appropriate to go to the emergency department.   Kari Baars, FNP-C [facility] [phone]

## 2018-05-25 ENCOUNTER — Other Ambulatory Visit: Payer: Self-pay | Admitting: Nurse Practitioner

## 2018-05-25 DIAGNOSIS — I10 Essential (primary) hypertension: Secondary | ICD-10-CM

## 2018-05-28 ENCOUNTER — Ambulatory Visit (INDEPENDENT_AMBULATORY_CARE_PROVIDER_SITE_OTHER): Payer: BC Managed Care – PPO

## 2018-05-28 DIAGNOSIS — Z23 Encounter for immunization: Secondary | ICD-10-CM | POA: Diagnosis not present

## 2018-07-09 ENCOUNTER — Telehealth: Payer: Self-pay | Admitting: Nurse Practitioner

## 2018-07-09 NOTE — Telephone Encounter (Signed)
She is having to take 3 or 4 for every migraine. Can the dose be increased or change to something else

## 2018-07-10 NOTE — Telephone Encounter (Signed)
Need to be seen to discuss

## 2018-07-11 NOTE — Telephone Encounter (Signed)
Patient has a follow up appointment scheduled. 

## 2018-07-12 ENCOUNTER — Ambulatory Visit: Payer: BC Managed Care – PPO | Admitting: Family

## 2018-07-12 ENCOUNTER — Encounter: Payer: Self-pay | Admitting: Family

## 2018-07-12 VITALS — BP 134/88 | HR 89 | Temp 98.3°F | Ht 62.25 in | Wt 278.4 lb

## 2018-07-12 DIAGNOSIS — Z6841 Body Mass Index (BMI) 40.0 and over, adult: Secondary | ICD-10-CM | POA: Diagnosis not present

## 2018-07-12 DIAGNOSIS — G43829 Menstrual migraine, not intractable, without status migrainosus: Secondary | ICD-10-CM

## 2018-07-12 MED ORDER — PROMETHAZINE HCL 25 MG/ML IJ SOLN
25.0000 mg | Freq: Once | INTRAMUSCULAR | Status: AC
  Administered 2018-07-12: 25 mg via INTRAMUSCULAR

## 2018-07-12 MED ORDER — PROMETHAZINE HCL 25 MG/ML IJ SOLN: 25.0000 mg | Freq: Once | INTRAMUSCULAR | Status: DC

## 2018-07-12 MED ORDER — KETOROLAC TROMETHAMINE 60 MG/2ML IM SOLN
60.0000 mg | Freq: Once | INTRAMUSCULAR | Status: AC
  Administered 2018-07-12: 60 mg via INTRAMUSCULAR

## 2018-07-12 MED ORDER — SUMATRIPTAN SUCCINATE 100 MG PO TABS: ORAL_TABLET | ORAL | 1 refills | Status: DC

## 2018-07-12 NOTE — Addendum Note (Signed)
Addended by: Julious Payer D on: 07/12/2018 06:01 PM   Modules accepted: Orders

## 2018-07-12 NOTE — Patient Instructions (Signed)

## 2018-07-12 NOTE — Progress Notes (Signed)
   Subjective:    Patient ID: Laura Combs, female    DOB: [birth year], 38 y.o.   MRN: [id number]  Chief Complaint  Patient presents with  . Migraine    since Sunday, maybe Saturday taken imitrex by 1030/11 am its back     Migraine   This is a new problem. The current episode started in the past 7 days. The problem occurs intermittently. The problem has been waxing and waning. The pain is located in the left unilateral region. The pain does not radiate. The pain quality is similar to prior headaches. The quality of the pain is described as throbbing. The pain is at a severity of 8/10. The pain is mild. Associated symptoms include phonophobia and photophobia. Exacerbated by: chocolate. Treatments tried: imitrex. The treatment provided mild relief. Her past medical history is significant for migraine headaches.      Review of Systems  Eyes: Positive for photophobia.  All other systems reviewed and are negative.      Objective:   Physical Exam Vitals signs reviewed.  Constitutional:      General: She is not in acute distress.    Appearance: She is well-developed.  Eyes:     Pupils: Pupils are equal, round, and reactive to light.  Neck:     Musculoskeletal: Normal range of motion and neck supple.     Thyroid: No thyromegaly.  Cardiovascular:     Rate and Rhythm: Normal rate and regular rhythm.     Heart sounds: Normal heart sounds. No murmur.  Pulmonary:     Effort: Pulmonary effort is normal. No respiratory distress.     Breath sounds: Normal breath sounds. No wheezing.  Abdominal:     General: Bowel sounds are normal. There is no distension.     Palpations: Abdomen is soft.     Tenderness: There is no abdominal tenderness.  Musculoskeletal: Normal range of motion.        General: No tenderness.  Skin:    General: Skin is warm and dry.  Neurological:     Mental Status: She is alert and oriented to person, place, and time.     Cranial Nerves: No cranial nerve deficit.     Deep Tendon Reflexes: Reflexes are normal and symmetric.  Psychiatric:        Behavior: Behavior normal.        Thought Content: Thought content normal.        Judgment: Judgment normal.      BP 134/88   Pulse 89   Temp 98.3 F (36.8 C) (Oral)   Ht 5' 2.25" (1.581 m)   Wt 278 lb 6.4 oz (126.3 kg)   BMI 50.51 kg/m       Assessment & Plan:  [name] comes in today with chief complaint of Migraine (since Sunday, maybe Saturday taken imitrex by 1030/11 am its back )   Diagnosis and orders addressed:  1. Menstrual migraine without status migrainosus, not intractable Avoid triggers Get at least 8 hours of sleep Stress management discussed Pt's mother is driving patient home RTO as needed if pain worsens or does not improve Keep follow up with PCP  - promethazine (PHENERGAN) injection 25 mg - ketorolac (TORADOL) injection 60 mg  2. BMI 45.0-49.9, adult (HCC)   Jannifer Rodney, FNP

## 2018-07-20 ENCOUNTER — Ambulatory Visit: Payer: BC Managed Care – PPO | Admitting: Nurse Practitioner

## 2018-07-20 ENCOUNTER — Encounter: Payer: Self-pay | Admitting: Nurse Practitioner

## 2018-07-20 VITALS — BP 123/86 | HR 91 | Temp 98.0°F | Ht 62.0 in | Wt 278.0 lb

## 2018-07-20 DIAGNOSIS — G43109 Migraine with aura, not intractable, without status migrainosus: Secondary | ICD-10-CM

## 2018-07-20 MED ORDER — ELETRIPTAN HYDROBROMIDE 40 MG PO TABS: 40.0000 mg | ORAL_TABLET | ORAL | 0 refills | Status: DC | PRN

## 2018-07-20 MED ORDER — ERENUMAB-AOOE 70 MG/ML ~~LOC~~ SOAJ: 70.0000 mg | SUBCUTANEOUS | 12 refills | Status: DC

## 2018-07-20 NOTE — Patient Instructions (Signed)

## 2018-07-20 NOTE — Progress Notes (Signed)
   Subjective:    Patient ID: Laura Combs, female    DOB: 09-21-1980, 38 y.o.   MRN: [id number]   Chief Complaint: Needs to change HA meds   HPI Patient comes in today to discuss her headache meds. Her headaches have been occurring more frequently. Occurs 3-5 day a month but will last several days despite taking imitrex  The only thing she is currently on is imitrex. She has tried topamax in the past and it made her feel terrible.    Review of Systems  Constitutional: Negative for activity change and appetite change.  HENT: Negative.   Eyes: Negative for pain.  Respiratory: Negative for shortness of breath.   Cardiovascular: Negative for chest pain, palpitations and leg swelling.  Gastrointestinal: Negative for abdominal pain.  Endocrine: Negative for polydipsia.  Genitourinary: Negative.   Skin: Negative for rash.  Neurological: Negative for dizziness, weakness and headaches.  Hematological: Does not bruise/bleed easily.  Psychiatric/Behavioral: Negative.   All other systems reviewed and are negative.      Objective:   Physical Exam Constitutional:      Appearance: She is obese.  Cardiovascular:     Rate and Rhythm: Regular rhythm.     Heart sounds: Normal heart sounds.  Pulmonary:     Effort: Pulmonary effort is normal.     Breath sounds: Normal breath sounds.  Skin:    General: Skin is warm and dry.  Neurological:     General: No focal deficit present.     Mental Status: She is alert and oriented to person, place, and time.     Cranial Nerves: No cranial nerve deficit.  Psychiatric:        Mood and Affect: Mood normal.        Behavior: Behavior normal.     BP 123/86   Pulse 91   Temp 98 F (36.7 C) (Oral)   Ht 5\' 2"  (1.575 m)   Wt 278 lb (126.1 kg)   BMI 50.85 kg/m       Assessment & Plan:  Laura Combs in today with chief complaint of Needs to change HA meds   1. Migraine with aura and without status migrainosus, not intractable Failed  topamax Will probably need  To get prior auth for aimovig- patient understands - eletriptan (RELPAX) 40 MG tablet; Take 1 tablet (40 mg total) by mouth as needed for migraine or headache. May repeat in 2 hours if headache persists or recurs.  Dispense: 10 tablet; Refill: 0 - Erenumab-aooe (AIMOVIG) 70 MG/ML SOAJ; Inject 70 mg into the skin every 30 (thirty) days.  Dispense: 1 mL; Refill: 12  The above assessment and management plan was discussed with the patient. The patient verbalized understanding of and has agreed to the management plan. Patient is aware to call the clinic if symptoms persist or worsen. Patient is aware when to return to the clinic for a follow-up visit. Patient educated on when it is appropriate to go to the emergency department.   Mary-Margaret Daphine Deutscher, FNP

## 2018-10-01 ENCOUNTER — Other Ambulatory Visit: Payer: Self-pay | Admitting: Nurse Practitioner

## 2018-10-01 DIAGNOSIS — G43109 Migraine with aura, not intractable, without status migrainosus: Secondary | ICD-10-CM

## 2018-11-27 ENCOUNTER — Other Ambulatory Visit: Payer: Self-pay | Admitting: Nurse Practitioner

## 2018-11-27 DIAGNOSIS — I10 Essential (primary) hypertension: Secondary | ICD-10-CM

## 2018-12-31 ENCOUNTER — Other Ambulatory Visit: Payer: Self-pay

## 2018-12-31 ENCOUNTER — Encounter: Payer: Self-pay | Admitting: Nurse Practitioner

## 2018-12-31 ENCOUNTER — Ambulatory Visit: Payer: BC Managed Care – PPO | Admitting: Nurse Practitioner

## 2018-12-31 VITALS — BP 124/88 | HR 83 | Temp 98.2°F | Ht 62.0 in | Wt 272.0 lb

## 2018-12-31 DIAGNOSIS — F419 Anxiety disorder, unspecified: Secondary | ICD-10-CM | POA: Diagnosis not present

## 2018-12-31 DIAGNOSIS — F339 Major depressive disorder, recurrent, unspecified: Secondary | ICD-10-CM | POA: Diagnosis not present

## 2018-12-31 MED ORDER — ESCITALOPRAM OXALATE 20 MG PO TABS: 20.0000 mg | ORAL_TABLET | Freq: Every day | ORAL | 5 refills | Status: DC

## 2018-12-31 NOTE — Patient Instructions (Signed)

## 2018-12-31 NOTE — Progress Notes (Signed)
   Subjective:    Patient ID: Laura Combs, female    DOB: 1981/05/02, 38 y.o.   MRN: [id number]    Chief Complaint: anxiety  HPI Patient comes in today to discuss her lexapro that she takes for anxiety. She is currently on lexapro 10mg  daily. She says she can no longer tell if she is anxious or depressed. She says she stays on edge all the time and does not feel like doing anything. Not sleeping well at all.  GAD 7 : Generalized Anxiety Score 12/31/2018  Nervous, Anxious, on Edge 3  Control/stop worrying 2  Worry too much - different things 0  Trouble relaxing 0  Restless 0  Easily annoyed or irritable 3  Afraid - awful might happen 0  Total GAD 7 Score 8  Anxiety Difficulty Somewhat difficult    Depression screen [facility] 2/9 12/31/2018 12/31/2018 07/20/2018  Decreased Interest 1 2 0  Down, Depressed, Hopeless 0 2 0  PHQ - 2 Score 1 4 0  Altered sleeping - 3 -  Tired, decreased energy - 3 -  Change in appetite - 1 -  Feeling bad or failure about yourself  - 2 -  Trouble concentrating - 1 -  Moving slowly or fidgety/restless - 1 -  Suicidal thoughts - - -  PHQ-9 Score - 15 -  Difficult doing work/chores - Very difficult -       Review of Systems  Constitutional: Negative for activity change and appetite change.  HENT: Negative.   Eyes: Negative for pain.  Respiratory: Negative for shortness of breath.   Cardiovascular: Negative for chest pain, palpitations and leg swelling.  Gastrointestinal: Negative for abdominal pain.  Endocrine: Negative for polydipsia.  Genitourinary: Negative.   Skin: Negative for rash.  Neurological: Negative for dizziness, weakness and headaches.  Hematological: Does not bruise/bleed easily.  Psychiatric/Behavioral: Negative.   All other systems reviewed and are negative.      Objective:   Physical Exam Vitals signs and nursing note reviewed.  Constitutional:      Appearance: Normal appearance.  Cardiovascular:     Rate and Rhythm:  Normal rate and regular rhythm.     Heart sounds: Normal heart sounds.  Pulmonary:     Effort: Pulmonary effort is normal.     Breath sounds: Normal breath sounds.  Abdominal:     General: Abdomen is flat. Bowel sounds are normal.     Palpations: Abdomen is soft.  Skin:    General: Skin is warm and dry.  Neurological:     General: No focal deficit present.     Mental Status: She is alert and oriented to person, place, and time.  Psychiatric:        Mood and Affect: Mood normal.        Behavior: Behavior normal.     BP 124/88   Pulse 83   Temp 98.2 F (36.8 C) (Oral)   Ht 5\' 2"  (1.575 m)   Wt 272 lb (123.4 kg)   BMI 49.75 kg/m        Assessment & Plan:  Carinne Mangini in today with chief complaint of Anxiety   1. Anxiety Stress management - escitalopram (LEXAPRO) 20 MG tablet; Take 1 tablet (20 mg total) by mouth daily.  Dispense: 30 tablet; Refill: 5  2. Depression, recurrent ([place]) - escitalopram (LEXAPRO) 20 MG tablet; Take 1 tablet (20 mg total) by mouth daily.  Dispense: 30 tablet; Refill: [place], FNP

## 2019-01-04 ENCOUNTER — Other Ambulatory Visit: Payer: Self-pay | Admitting: Nurse Practitioner

## 2019-01-04 DIAGNOSIS — G43109 Migraine with aura, not intractable, without status migrainosus: Secondary | ICD-10-CM

## 2019-01-24 ENCOUNTER — Encounter: Payer: Self-pay | Admitting: Family Medicine

## 2019-01-24 ENCOUNTER — Telehealth: Payer: Self-pay | Admitting: *Deleted

## 2019-01-24 ENCOUNTER — Ambulatory Visit (INDEPENDENT_AMBULATORY_CARE_PROVIDER_SITE_OTHER): Payer: BC Managed Care – PPO | Admitting: Family Medicine

## 2019-01-24 ENCOUNTER — Other Ambulatory Visit: Payer: Self-pay

## 2019-01-24 DIAGNOSIS — B029 Zoster without complications: Secondary | ICD-10-CM | POA: Diagnosis not present

## 2019-01-24 MED ORDER — VALACYCLOVIR HCL 1 G PO TABS: 1000.0000 mg | ORAL_TABLET | Freq: Three times a day (TID) | ORAL | 0 refills | Status: AC

## 2019-01-24 MED ORDER — LIDOCAINE HCL 2 % EX CREA: 1.0000 "application " | TOPICAL_CREAM | Freq: Three times a day (TID) | CUTANEOUS | 0 refills | Status: DC | PRN

## 2019-01-24 NOTE — Progress Notes (Signed)
Virtual Visit via Telephone Note  I connected with Laura GoresKristin Rothermel on 01/24/19 at 8:01 AM by telephone and verified that I am speaking with the correct person using two identifiers. Laura CruiseKristin Combs is currently located at home and nobody is currently with her during this visit. The provider, Gwenlyn FudgeBRITNEY F Jamisyn Langer, FNP is located in their home at time of visit.  I discussed the limitations, risks, security and privacy concerns of performing an evaluation and management service by telephone and the availability of in person appointments. I also discussed with the patient that there may be a patient responsible charge related to this service. The patient expressed understanding and agreed to proceed.  Subjective: PCP: Laura Combs, Mary-Margaret, FNP  Chief Complaint  Patient presents with  . Herpes Zoster   Patient reports four days ago she noticed a spot on her chest as big as quarter that was red in appearance; it did not bother her at that time. On Monday (3 days ago) the spot on her chest starting itching and was raised. She also noticed spots on her right shoulder and right side of her neck. She describes the bumps as raised and fluid filled but not draining anything. She is experiencing a burning/tingling pain as well as the itching. The areas are sore and she can't stand to touch it, wear a shirt, or for the water to hit it in the shower. She has not applied anything to the areas.    ROS: Per HPI  Current Outpatient Medications:  .  eletriptan (RELPAX) 40 MG tablet, TAKE 1 TABLET AT ONSET OF HEADACHE, MAY REPEAT ONCE IN 2 HOURS, Disp: 9 tablet, Rfl: 0 .  Erenumab-aooe (AIMOVIG) 70 MG/ML SOAJ, Inject 70 mg into the skin every 30 (thirty) days., Disp: 1 mL, Rfl: 12 .  escitalopram (LEXAPRO) 20 MG tablet, Take 1 tablet (20 mg total) by mouth daily., Disp: 30 tablet, Rfl: 5 .  etonogestrel-ethinyl estradiol (NUVARING) 0.12-0.015 MG/24HR vaginal ring, Place 1 each vaginally every 28 (twenty-eight)  days. Insert vaginally and leave in place for 3 consecutive weeks, then remove for 1 week., Disp: , Rfl:  .  fluticasone (FLONASE) 50 MCG/ACT nasal spray, Place 2 sprays into both nostrils daily., Disp: 16 g, Rfl: 6 .  Lidocaine HCl 2 % CREA, Apply 1 application topically 3 (three) times daily as needed., Disp: 118 mL, Rfl: 0 .  lisinopril (ZESTRIL) 20 MG tablet, Take 1 tablet (20 mg total) by mouth daily. (Needs to be seen before next refill), Disp: 30 tablet, Rfl: 0 .  Multiple Vitamin (MULTIVITAMIN) tablet, Take 1 tablet by mouth daily., Disp: , Rfl:  .  valACYclovir (VALTREX) 1000 MG tablet, Take 1 tablet (1,000 mg total) by mouth 3 (three) times daily for 7 days., Disp: 21 tablet, Rfl: 0  No Known Allergies Past Medical History:  Diagnosis Date  . Headache(784.0)   . History of kidney stones   . Hypertension   . Urinary incontinence     Observations/Objective: A&O  No respiratory distress or wheezing audible over the phone Mood, judgement, and thought processes all WNL  Assessment and Plan: 1. Herpes zoster without complication - Education provided on Shingles. Discussed that she is contagious if the blisters start to drain and that she needs to keep them covered and wash her hands frequently.  - valACYclovir (VALTREX) 1000 MG tablet; Take 1 tablet (1,000 mg total) by mouth 3 (three) times daily for 7 days.  Dispense: 21 tablet; Refill: 0 - Lidocaine HCl 2 %  CREA; Apply 1 application topically 3 (three) times daily as needed.  Dispense: 118 mL; Refill: 0   Follow Up Instructions:  I discussed the assessment and treatment plan with the patient. The patient was provided an opportunity to ask questions and all were answered. The patient agreed with the plan and demonstrated an understanding of the instructions.   The patient was advised to call back or seek an in-person evaluation if the symptoms worsen or if the condition fails to improve as anticipated.  The above assessment and  management plan was discussed with the patient. The patient verbalized understanding of and has agreed to the management plan. Patient is aware to call the clinic if symptoms persist or worsen. Patient is aware when to return to the clinic for a follow-up visit. Patient educated on when it is appropriate to go to the emergency department.   Time call ended: 8:10 AM  I provided 9 minutes of non-face-to-face time during this encounter.  Hendricks Limes, MSN, APRN, FNP-C [facility] 01/24/19

## 2019-01-24 NOTE — Patient Instructions (Signed)
Shingles  Shingles is an infection. It gives you a painful skin rash and blisters that have fluid in them. Shingles is caused by the same germ (virus) that causes chickenpox. Shingles only happens in people who:  Have had chickenpox.  Have been given a shot of medicine (vaccine) to protect against chickenpox. Shingles is rare in this group. The first symptoms of shingles may be itching, tingling, or pain in an area on your skin. A rash will show on your skin a few days or weeks later. The rash is likely to be on one side of your body. The rash usually has a shape like a belt or a band. Over time, the rash turns into fluid-filled blisters. The blisters will break open, change into scabs, and dry up. Medicines may:  Help with pain and itching.  Help you get better sooner.  Help to prevent long-term problems. Follow these instructions at home: Medicines  Take over-the-counter and prescription medicines only as told by your doctor.  Put on an anti-itch cream or numbing cream where you have a rash, blisters, or scabs. Do this as told by your doctor. Helping with itching and discomfort   Put cold, wet cloths (cold compresses) on the area of the rash or blisters as told by your doctor.  Cool baths can help you feel better. Try adding baking soda or dry oatmeal to the water to lessen itching. Do not bathe in hot water. Blister and rash care  Keep your rash covered with a loose bandage (dressing).  Wear loose clothing that does not rub on your rash.  Keep your rash and blisters clean. To do this, wash the area with mild soap and cool water as told by your doctor.  Check your rash every day for signs of infection. Check for: ? More redness, swelling, or pain. ? Fluid or blood. ? Warmth. ? Pus or a bad smell.  Do not scratch your rash. Do not pick at your blisters. To help you to not scratch: ? Keep your fingernails clean and cut short. ? Wear gloves or mittens when you sleep, if  scratching is a problem. General instructions  Rest as told by your doctor.  Keep all follow-up visits as told by your doctor. This is important.  Wash your hands often with soap and water. If soap and water are not available, use hand sanitizer. Doing this lowers your chance of getting a skin infection caused by germs (bacteria).  Your infection can cause chickenpox in people who have never had chickenpox or never got a shot of chickenpox vaccine. If you have blisters that did not change into scabs yet, try not to touch other people or be around other people, especially: ? Babies. ? Pregnant women. ? Children who have areas of red, itchy, or rough skin (eczema). ? Very old people who have transplants. ? People who have a long-term (chronic) sickness, like cancer or AIDS. Contact a doctor if:  Your pain does not get better with medicine.  Your pain does not get better after the rash heals.  You have any signs of infection in the rash area. These signs include: ? More redness, swelling, or pain around the rash. ? Fluid or blood coming from the rash. ? The rash area feeling warm to the touch. ? Pus or a bad smell coming from the rash. Get help right away if:  The rash is on your face or nose.  You have pain in your face or pain by   your eye.  You lose feeling on one side of your face.  You have trouble seeing.  You have ear pain, or you have ringing in your ear.  You have a loss of taste.  Your condition gets worse. Summary  Shingles gives you a painful skin rash and blisters that have fluid in them.  Shingles is an infection. It is caused by the same germ (virus) that causes chickenpox.  Keep your rash covered with a loose bandage (dressing). Wear loose clothing that does not rub on your rash.  If you have blisters that did not change into scabs yet, try not to touch other people or be around people. This information is not intended to replace advice given to you by  your health care provider. Make sure you discuss any questions you have with your health care provider. Document Released: 12/07/2007 Document Revised: 10/12/2018 Document Reviewed: 02/22/2017 Elsevier Patient Education  2020 Elsevier Inc.  

## 2019-01-24 NOTE — Telephone Encounter (Addendum)
Prior Auth for Aimovig 70MG /ML auto-injectors-APPROVED 01/24/19-01/24/2020  PA# [id number] Non-Grandfathered [id number] EC   (Key: [id number])  Your information has been submitted to [place]. To check for an updated outcome later, reopen this PA request from your dashboard.  If [employer] has not responded to your request within 24 hours, contact [place] at [phone]. If you think there may be a problem with your PA request, use our live chat feature at the bottom right.

## 2019-01-30 ENCOUNTER — Ambulatory Visit: Payer: BC Managed Care – PPO | Admitting: Family Medicine

## 2019-01-31 ENCOUNTER — Encounter: Payer: Self-pay | Admitting: Nurse Practitioner

## 2019-01-31 ENCOUNTER — Ambulatory Visit (INDEPENDENT_AMBULATORY_CARE_PROVIDER_SITE_OTHER): Payer: BC Managed Care – PPO | Admitting: Nurse Practitioner

## 2019-01-31 DIAGNOSIS — I10 Essential (primary) hypertension: Secondary | ICD-10-CM | POA: Diagnosis not present

## 2019-01-31 DIAGNOSIS — Z6841 Body Mass Index (BMI) 40.0 and over, adult: Secondary | ICD-10-CM

## 2019-01-31 DIAGNOSIS — F419 Anxiety disorder, unspecified: Secondary | ICD-10-CM

## 2019-01-31 DIAGNOSIS — F411 Generalized anxiety disorder: Secondary | ICD-10-CM

## 2019-01-31 DIAGNOSIS — F339 Major depressive disorder, recurrent, unspecified: Secondary | ICD-10-CM

## 2019-01-31 MED ORDER — LISINOPRIL 20 MG PO TABS: 20.0000 mg | ORAL_TABLET | Freq: Every day | ORAL | 1 refills | Status: DC

## 2019-01-31 MED ORDER — ESCITALOPRAM OXALATE 20 MG PO TABS: 20.0000 mg | ORAL_TABLET | Freq: Every day | ORAL | 1 refills | Status: DC

## 2019-01-31 NOTE — Progress Notes (Signed)
Virtual Visit via telephone Note Due to COVID-19 pandemic this visit was conducted virtually. This visit type was conducted due to national recommendations for restrictions regarding the COVID-19 Pandemic (e.g. social distancing, sheltering in place) in an effort to limit this patient's exposure and mitigate transmission in our community. All issues noted in this document were discussed and addressed.  A physical exam was not performed with this format.  I connected with Laura Combs on 01/31/19 at 12:30 by telephone and verified that I am speaking with the correct person using two identifiers. [place] and her family is currently with her during visit. The provider, Mary-Margaret Daphine DeutscherMartin, FNP is located in their office at time of visit.  I discussed the limitations, risks, security and privacy concerns of performing an evaluation and management service by telephone and the availability of in person appointments. I also discussed with the patient that there may be a patient responsible charge related to this service. The patient expressed understanding and agreed to proceed.   History and Present Illness:   Chief Complaint: Medical Management of Chronic Issues    HPI:  1. Essential hypertension No c/o chest pain, sob or headache. Does not check blood pressure at home. BP Readings from Last 3 Encounters:  12/31/18 124/88  07/20/18 123/86  07/12/18 134/88    2. GAD (generalized anxiety disorder) Patient was seen on 12/31/18 with anxiety. At that time she was staying on edge all the time and just needed something to help her. She was put on lexapro 20mg  daily. She says she is much better. She seems less on edge and denies any side effects.  3. Morbid obesity with BMI of 45.0-49.9, adult (HCC) No recent weight chnages    Outpatient Encounter Medications as of 01/31/2019  Medication Sig  . eletriptan (RELPAX) 40 MG tablet TAKE 1 TABLET AT ONSET OF  HEADACHE, MAY REPEAT ONCE IN 2 HOURS  . Erenumab-aooe (AIMOVIG) 70 MG/ML SOAJ Inject 70 mg into the skin every 30 (thirty) days.  [name]. escitalopram (LEXAPRO) 20 MG tablet Take 1 tablet (20 mg total) by mouth daily.  [name]. etonogestrel-ethinyl estradiol (NUVARING) 0.12-0.015 MG/24HR vaginal ring Place 1 each vaginally every 28 (twenty-eight) days. Insert vaginally and leave in place for 3 consecutive weeks, then remove for 1 week.  . fluticasone (FLONASE) 50 MCG/ACT nasal spray Place 2 sprays into both nostrils daily.  . Lidocaine HCl 2 % CREA Apply 1 application topically 3 (three) times daily as needed.  [name]. lisinopril (ZESTRIL) 20 MG tablet Take 1 tablet (20 mg total) by mouth daily. (Needs to be seen before next refill)  . Multiple Vitamin (MULTIVITAMIN) tablet Take 1 tablet by mouth daily.  . valACYclovir (VALTREX) 1000 MG tablet Take 1 tablet (1,000 mg total) by mouth 3 (three) times daily for 7 days.    Past Surgical History:  Procedure Laterality Date  . DILATION AND EVACUATION  11/03/2011   Procedure: DILATATION AND EVACUATION;  Surgeon: Levi AlandMark E Anderson, MD;  Location: [facility];  Service: Gynecology;  Laterality: N/A;    Family History  Problem Relation Age of Onset  . Diabetes Father   . Hypertension Father   . Hyperlipidemia Father   . Diabetes Maternal Aunt   . Diabetes Maternal Grandmother   . Diabetes Maternal Grandfather     New complaints: Had shingles last week ,but is okay  Social history: [place] beach  Controlled substance contract: N/A    Review of Systems  Constitutional: Negative for diaphoresis and weight loss.  Eyes: Negative for blurred vision, double vision and pain.  Respiratory: Negative for shortness of breath.   Cardiovascular: Negative for chest pain, palpitations, orthopnea and leg swelling.  Gastrointestinal: Negative for abdominal pain.  Skin: Negative for rash.  Neurological: Negative for dizziness, sensory change, loss of consciousness,  weakness and headaches.  Endo/Heme/Allergies: Negative for polydipsia. Does not bruise/bleed easily.  Psychiatric/Behavioral: Negative for memory loss. The patient does not have insomnia.   All other systems reviewed and are negative.    Observations/Objective: Alert and oriented- answers all questions appropriately No distress  Assessment and Plan: [name] comes in today with chief complaint of Medical Management of Chronic Issues   Diagnosis and orders addressed:  1. Essential hypertension, benign Low sodium diet - lisinopril (ZESTRIL) 20 MG tablet; Take 1 tablet (20 mg total) by mouth daily. (Needs to be seen before next refill)  Dispense: 90 tablet; Refill: 1     2. GAD (generalized anxiety disorder) Stress management - escitalopram (LEXAPRO) 20 MG tablet; Take 1 tablet (20 mg total) by mouth daily.  Dispense: 90 tablet; Refill: 1  3. Morbid obesity with BMI of 45.0-49.9, adult ([place]) Discussed diet and exercise for person with BMI >25 Will recheck weight in 3-6 months   Previous labs reviewed Health Maintenance reviewed Diet and exercise encouraged  Follow up plan: 6 months     I discussed the assessment and treatment plan with the patient. The patient was provided an opportunity to ask questions and all were answered. The patient agreed with the plan and demonstrated an understanding of the instructions.   The patient was advised to call back or seek an in-person evaluation if the symptoms worsen or if the condition fails to improve as anticipated.  The above assessment and management plan was discussed with the patient. The patient verbalized understanding of and has agreed to the management plan. Patient is aware to call the clinic if symptoms persist or worsen. Patient is aware when to return to the clinic for a follow-up visit. Patient educated on when it is appropriate to go to the emergency department.   Time call ended:  12:43  I provided 13  minutes of non-face-to-face time during this encounter.    Mary-Margaret Hassell Done, FNP

## 2019-03-04 ENCOUNTER — Other Ambulatory Visit: Payer: Self-pay | Admitting: Nurse Practitioner

## 2019-03-04 DIAGNOSIS — G43109 Migraine with aura, not intractable, without status migrainosus: Secondary | ICD-10-CM

## 2019-03-22 ENCOUNTER — Ambulatory Visit (INDEPENDENT_AMBULATORY_CARE_PROVIDER_SITE_OTHER): Payer: BC Managed Care – PPO

## 2019-03-22 ENCOUNTER — Encounter: Payer: Self-pay | Admitting: Nurse Practitioner

## 2019-03-22 ENCOUNTER — Ambulatory Visit (INDEPENDENT_AMBULATORY_CARE_PROVIDER_SITE_OTHER): Payer: BC Managed Care – PPO | Admitting: Nurse Practitioner

## 2019-03-22 ENCOUNTER — Other Ambulatory Visit: Payer: Self-pay

## 2019-03-22 VITALS — BP 122/87 | HR 88 | Temp 97.1°F | Resp 16 | Ht 62.0 in | Wt 277.0 lb

## 2019-03-22 DIAGNOSIS — S3210XA Unspecified fracture of sacrum, initial encounter for closed fracture: Secondary | ICD-10-CM

## 2019-03-22 DIAGNOSIS — S322XXA Fracture of coccyx, initial encounter for closed fracture: Secondary | ICD-10-CM

## 2019-03-22 DIAGNOSIS — M533 Sacrococcygeal disorders, not elsewhere classified: Secondary | ICD-10-CM

## 2019-03-22 MED ORDER — NAPROXEN 500 MG PO TABS: 500.0000 mg | ORAL_TABLET | Freq: Two times a day (BID) | ORAL | 1 refills | Status: DC

## 2019-03-22 NOTE — Patient Instructions (Signed)
Tailbone Injury The tailbone is the small bone at the lower end of the backbone (spine). The tailbone can become injured from:  A fall.  Sitting to row or bike for a long time.  Having a baby. This type of injury can be painful. Most tailbone injuries get better on their own in 4-6 weeks. Follow these instructions at home: Activity  Avoid sitting in one place for a long time.  Wear proper pads and gear when riding a bike or rowing.  Increase your activity as the pain allows.  Do exercises as told by your doctor or physical therapist. Managing pain, stiffness, and swelling  To lessen pain: ? Sit on a large, rubber or inflated ring or cushion. ? Lean forward when you sit.  If told, apply ice to the injured area. ? Put ice in a plastic bag. ? Place a towel between your skin and the bag. ? Leave the ice on for 20 minutes, 2-3 times per day. Do this for the first 1-2 days.  If told, put heat on the injured area. Do this as often as told by your doctor. Use the heat source that your doctor recommends, such as a moist heat pack or a heating pad. ? Place a towel between your skin and the heat source. ? Leave the heat on for 20-30 minutes. ? Remove the heat if your skin turns bright red. This is very important if you are unable to feel pain, heat, or cold. You may have a greater risk of getting burned. General instructions  Take over-the-counter and prescription medicines only as told by your doctor.  To prevent or treat trouble pooping (constipation) or pain when pooping, your doctor may suggest that you: ? Drink enough fluid to keep your pee (urine) pale yellow. ? Eat foods that are high in fiber. These include fresh fruits and vegetables, whole grains, and beans. ? Limit foods that are high in fat and sugar. These include fried and sweet foods. ? Take an over-the-counter or prescription medicine to treat trouble pooping.  Keep all follow-up visits as told by your doctor. This is  important. Contact a doctor if:  Your pain gets worse.  Pooping causes you pain.  You cannot poop after 4 days.  You have pain during sex. Summary  A tailbone injury can happen from a fall, from sitting for a long time to row or bike, or after having a baby.  These injuries can be painful. Most tailbone injuries get better on their own in 4-6 weeks.  Sit on a large, rubber or inflated ring or cushion to lessen pain.  Avoid sitting in one place for a long time.  Follow your doctor's suggestions to prevent or treat trouble pooping. This information is not intended to replace advice given to you by your health care provider. Make sure you discuss any questions you have with your health care provider. Document Released: 07/23/2010 Document Revised: 07/18/2017 Document Reviewed: 07/18/2017 Elsevier Patient Education  2020 [employer] Inc[employer]  

## 2019-03-22 NOTE — Progress Notes (Signed)
   Subjective:    Patient ID: Laura Combs, female    DOB: [birth year], 38 y.o.   MRN: [id number]   Chief Complaint: Tailbone Pain [place] in 2018 and getting worse)   HPI Patient comes in today c/o tail bone pain. Started several weeks ago and has gradually gotten worse. She has had several falls over the years but nothing recently. She rates pain 8/10. Very painful to seat for long periods of time. Going form seating to standing increases pain until she stands all the way.   Review of Systems  Constitutional: Negative for activity change and appetite change.  HENT: Negative.   Eyes: Negative for pain.  Respiratory: Negative for shortness of breath.   Cardiovascular: Negative for chest pain, palpitations and leg swelling.  Gastrointestinal: Negative for abdominal pain.  Endocrine: Negative for polydipsia.  Genitourinary: Negative.   Skin: Negative for rash.  Neurological: Negative for dizziness, weakness and headaches.  Hematological: Does not bruise/bleed easily.  Psychiatric/Behavioral: Negative.   All other systems reviewed and are negative.      Objective:   Physical Exam Vitals signs and nursing note reviewed.  Cardiovascular:     Rate and Rhythm: Normal rate and regular rhythm.     Heart sounds: Normal heart sounds.  Pulmonary:     Effort: Pulmonary effort is normal.     Breath sounds: Normal breath sounds.  Skin:    General: Skin is warm.  Neurological:     General: No focal deficit present.     Mental Status: She is oriented to person, place, and time.  Psychiatric:        Mood and Affect: Mood normal.        Behavior: Behavior normal.    BP 122/87   Pulse 88   Temp (!) 97.1 F (36.2 C) (Oral)   Resp 16   Ht 5\' 2"  (1.575 m)   Wt 277 lb (125.6 kg)   SpO2 100%   BMI 50.66 kg/m    Coccyx xray- distal coccyx fracture-Preliminary reading by Ronnald Collum, FNP  [facility]     Assessment & Plan:  Laura Combs in today with chief complaint of Tailbone Pain  [place] in 2018 and getting worse)   1. Tail bone pain - DG Sacrum/Coccyx; Future  2. Closed fracture of sacrum and coccyx, initial encounter ([place]) Sit on donut If no improvement in 2 weeks then may send to ortho - naproxen (NAPROSYN) 500 MG tablet; Take 1 tablet (500 mg total) by mouth 2 (two) times daily with a meal.  Dispense: 60 tablet; Refill: [place], FNP

## 2019-04-05 ENCOUNTER — Other Ambulatory Visit: Payer: Self-pay | Admitting: Nurse Practitioner

## 2019-04-05 DIAGNOSIS — G43109 Migraine with aura, not intractable, without status migrainosus: Secondary | ICD-10-CM

## 2019-05-05 DIAGNOSIS — J189 Pneumonia, unspecified organism: Secondary | ICD-10-CM

## 2019-05-05 HISTORY — DX: Pneumonia, unspecified organism: J18.9

## 2019-05-06 ENCOUNTER — Other Ambulatory Visit: Payer: Self-pay | Admitting: Nurse Practitioner

## 2019-05-06 DIAGNOSIS — G43109 Migraine with aura, not intractable, without status migrainosus: Secondary | ICD-10-CM

## 2019-05-14 ENCOUNTER — Other Ambulatory Visit: Payer: Self-pay

## 2019-05-14 ENCOUNTER — Ambulatory Visit (INDEPENDENT_AMBULATORY_CARE_PROVIDER_SITE_OTHER): Payer: BC Managed Care – PPO

## 2019-05-14 DIAGNOSIS — Z23 Encounter for immunization: Secondary | ICD-10-CM | POA: Diagnosis not present

## 2019-05-17 ENCOUNTER — Other Ambulatory Visit: Payer: Self-pay

## 2019-05-17 DIAGNOSIS — Z20822 Contact with and (suspected) exposure to covid-19: Secondary | ICD-10-CM

## 2019-05-20 ENCOUNTER — Other Ambulatory Visit: Payer: Self-pay | Admitting: Nurse Practitioner

## 2019-05-20 DIAGNOSIS — U071 COVID-19: Secondary | ICD-10-CM

## 2019-05-20 LAB — NOVEL CORONAVIRUS, NAA: SARS-CoV-2, NAA: DETECTED — AB

## 2019-05-20 MED ORDER — AZITHROMYCIN 250 MG PO TABS: ORAL_TABLET | ORAL | 0 refills | Status: DC

## 2019-05-20 MED ORDER — PREDNISONE 20 MG PO TABS: ORAL_TABLET | ORAL | 0 refills | Status: DC

## 2019-05-22 ENCOUNTER — Inpatient Hospital Stay (HOSPITAL_COMMUNITY)
Admission: EM | Admit: 2019-05-22 | Discharge: 2019-05-27 | DRG: 177 | Disposition: A | Discharge status: Home / Self Care | Payer: BC Managed Care – PPO | Attending: Internal Medicine | Admitting: Internal Medicine

## 2019-05-22 ENCOUNTER — Emergency Department (HOSPITAL_COMMUNITY): Payer: BC Managed Care – PPO

## 2019-05-22 ENCOUNTER — Encounter (HOSPITAL_COMMUNITY): Payer: Self-pay

## 2019-05-22 ENCOUNTER — Other Ambulatory Visit: Payer: Self-pay

## 2019-05-22 DIAGNOSIS — Z6841 Body Mass Index (BMI) 40.0 and over, adult: Secondary | ICD-10-CM | POA: Diagnosis not present

## 2019-05-22 DIAGNOSIS — J9601 Acute respiratory failure with hypoxia: Secondary | ICD-10-CM | POA: Diagnosis present

## 2019-05-22 DIAGNOSIS — U071 COVID-19: Secondary | ICD-10-CM | POA: Diagnosis present

## 2019-05-22 DIAGNOSIS — J9600 Acute respiratory failure, unspecified whether with hypoxia or hypercapnia: Secondary | ICD-10-CM | POA: Diagnosis not present

## 2019-05-22 DIAGNOSIS — Z8249 Family history of ischemic heart disease and other diseases of the circulatory system: Secondary | ICD-10-CM | POA: Diagnosis not present

## 2019-05-22 DIAGNOSIS — Z833 Family history of diabetes mellitus: Secondary | ICD-10-CM | POA: Diagnosis not present

## 2019-05-22 DIAGNOSIS — J1289 Other viral pneumonia: Secondary | ICD-10-CM | POA: Diagnosis present

## 2019-05-22 DIAGNOSIS — R7401 Elevation of levels of liver transaminase levels: Secondary | ICD-10-CM | POA: Diagnosis present

## 2019-05-22 DIAGNOSIS — R945 Abnormal results of liver function studies: Secondary | ICD-10-CM | POA: Diagnosis present

## 2019-05-22 DIAGNOSIS — G4733 Obstructive sleep apnea (adult) (pediatric): Secondary | ICD-10-CM | POA: Diagnosis present

## 2019-05-22 DIAGNOSIS — I10 Essential (primary) hypertension: Secondary | ICD-10-CM | POA: Diagnosis present

## 2019-05-22 LAB — CBC WITH DIFFERENTIAL/PLATELET
Abs Immature Granulocytes: 0.02 10*3/uL (ref 0.00–0.07)
Basophils Absolute: 0 10*3/uL (ref 0.0–0.1)
Basophils Relative: 0 %
Eosinophils Absolute: 0 10*3/uL (ref 0.0–0.5)
Eosinophils Relative: 0 %
HCT: 41.9 % (ref 36.0–46.0)
Hemoglobin: 13.4 g/dL (ref 12.0–15.0)
Immature Granulocytes: 0 %
Lymphocytes Relative: 24 %
Lymphs Abs: 1.4 10*3/uL (ref 0.7–4.0)
MCH: 29.2 pg (ref 26.0–34.0)
MCHC: 32 g/dL (ref 30.0–36.0)
MCV: 91.3 fL (ref 80.0–100.0)
Monocytes Absolute: 0.3 10*3/uL (ref 0.1–1.0)
Monocytes Relative: 5 %
Neutro Abs: 4.1 10*3/uL (ref 1.7–7.7)
Neutrophils Relative %: 71 %
Platelets: 142 10*3/uL — ABNORMAL LOW (ref 150–400)
RBC: 4.59 MIL/uL (ref 3.87–5.11)
RDW: 13 % (ref 11.5–15.5)
WBC: 5.9 10*3/uL (ref 4.0–10.5)
nRBC: 0 % (ref 0.0–0.2)

## 2019-05-22 LAB — COMPREHENSIVE METABOLIC PANEL
ALT: 61 U/L — ABNORMAL HIGH (ref 0–44)
AST: 74 U/L — ABNORMAL HIGH (ref 15–41)
Albumin: 3 g/dL — ABNORMAL LOW (ref 3.5–5.0)
Alkaline Phosphatase: 78 U/L (ref 38–126)
Anion gap: 9 (ref 5–15)
BUN: 10 mg/dL (ref 6–20)
CO2: 22 mmol/L (ref 22–32)
Calcium: 8.5 mg/dL — ABNORMAL LOW (ref 8.9–10.3)
Chloride: 105 mmol/L (ref 98–111)
Creatinine, Ser: 0.73 mg/dL (ref 0.44–1.00)
GFR calc Af Amer: >60 mL/min (ref >60)
GFR calc non Af Amer: >60 mL/min (ref >60)
Glucose, Bld: 115 mg/dL — ABNORMAL HIGH (ref 70–99)
Potassium: 3.9 mmol/L (ref 3.5–5.1)
Sodium: 136 mmol/L (ref 135–145)
Total Bilirubin: 0.3 mg/dL (ref 0.3–1.2)
Total Protein: 6 g/dL — ABNORMAL LOW (ref 6.5–8.1)

## 2019-05-22 LAB — LACTIC ACID, PLASMA
Lactic Acid, Venous: 2.1 mmol/L (ref 0.5–1.9)
Lactic Acid, Venous: 1 mmol/L (ref 0.5–1.9)

## 2019-05-22 LAB — LACTATE DEHYDROGENASE: LDH: 270 U/L — ABNORMAL HIGH (ref 98–192)

## 2019-05-22 LAB — C-REACTIVE PROTEIN: CRP: 6.5 mg/dL — ABNORMAL HIGH (ref <1.0)

## 2019-05-22 LAB — ABO/RH: ABO/RH(D): A POS

## 2019-05-22 LAB — FERRITIN: Ferritin: 362 ng/mL — ABNORMAL HIGH (ref 11–307)

## 2019-05-22 LAB — TRIGLYCERIDES: Triglycerides: 361 mg/dL — ABNORMAL HIGH (ref <150)

## 2019-05-22 LAB — TROPONIN I (HIGH SENSITIVITY)
Troponin I (High Sensitivity): 4 ng/L (ref <18)
Troponin I (High Sensitivity): 4 ng/L (ref <18)

## 2019-05-22 LAB — POC URINE PREG, ED: Preg Test, Ur: NEGATIVE

## 2019-05-22 LAB — PROCALCITONIN: Procalcitonin: <0.1 ng/mL

## 2019-05-22 LAB — FIBRINOGEN: Fibrinogen: 464 mg/dL (ref 210–475)

## 2019-05-22 LAB — D-DIMER, QUANTITATIVE: D-Dimer, Quant: 0.37 ug/mL-FEU (ref 0.00–0.50)

## 2019-05-22 MED ORDER — SODIUM CHLORIDE 0.9% FLUSH
3.0000 mL | Freq: Two times a day (BID) | INTRAVENOUS | Status: DC
  Administered 2019-05-22 – 2019-05-27 (×9): 3 mL via INTRAVENOUS

## 2019-05-22 MED ORDER — ENOXAPARIN SODIUM 40 MG/0.4ML ~~LOC~~ SOLN
40.0000 mg | SUBCUTANEOUS | Status: DC
  Administered 2019-05-22: 21:00:00 40 mg via SUBCUTANEOUS
  Filled 2019-05-22: qty 0.4

## 2019-05-22 MED ORDER — DEXAMETHASONE SODIUM PHOSPHATE 10 MG/ML IJ SOLN: 6.0000 mg | INTRAMUSCULAR | Status: DC

## 2019-05-22 MED ORDER — SODIUM CHLORIDE 0.9% FLUSH
3.0000 mL | INTRAVENOUS | Status: DC | PRN
  Administered 2019-05-25: 3 mL via INTRAVENOUS
  Filled 2019-05-22: qty 3

## 2019-05-22 MED ORDER — SODIUM CHLORIDE 0.9 % IV SOLN
250.0000 mL | INTRAVENOUS | Status: DC | PRN
  Administered 2019-05-23: 250 mL via INTRAVENOUS

## 2019-05-22 MED ORDER — DEXAMETHASONE SODIUM PHOSPHATE 10 MG/ML IJ SOLN
10.0000 mg | Freq: Once | INTRAMUSCULAR | Status: AC
  Administered 2019-05-22: 20:00:00 10 mg via INTRAVENOUS
  Filled 2019-05-22: qty 1

## 2019-05-22 MED ORDER — SODIUM CHLORIDE 0.9 % IV BOLUS
500.0000 mL | Freq: Once | INTRAVENOUS | Status: AC
  Administered 2019-05-22: 19:00:00 500 mL via INTRAVENOUS

## 2019-05-22 MED ORDER — ACETAMINOPHEN 325 MG PO TABS
650.0000 mg | ORAL_TABLET | Freq: Four times a day (QID) | ORAL | Status: DC | PRN
  Administered 2019-05-22 – 2019-05-23 (×2): 650 mg via ORAL
  Filled 2019-05-22 (×2): qty 2

## 2019-05-22 MED ORDER — SODIUM CHLORIDE 0.9 % IV SOLN
500.0000 mg | INTRAVENOUS | Status: DC
  Administered 2019-05-22: 500 mg via INTRAVENOUS
  Filled 2019-05-22: qty 500

## 2019-05-22 NOTE — ED Triage Notes (Signed)
Pt brought in by EMS due SOB. Pt received positive covid results Monday with increasing SOB, weakness and body aches. Sats in low 80's when fire dept got on scene  Placed on non rebreather at 15 L and brought sats up to 90' Currently on 4 L via [place] Temp 101 per EMS

## 2019-05-22 NOTE — H&P (Signed)
TRH H&P    Patient Demographics:    Laura Combs, is a 38 y.o. female  MRN: [id number]  DOB - 05-06-1981  Admit Date - 05/22/2019  Referring MD/NP/PA: Glynn Octave  Outpatient Primary MD for the patient is Bennie Pierini, FNP  Patient coming from: home  Chief complaint- dyspnea   HPI:    Laura Combs  is a 38 y.o. female,  w hypertension, h/o nephrolithiasis, covid -19 positive (Monday) presents with c/o increasing dyspnea, myalgia.  o2 sat 80's. Pt brought to ED by EMS, T 101.  Pt notes not feeling well since Friday and got tested for Covid-19,  Results came back Monday. Pt states that the dyspnea started Monday and has had intermittent fever and dry cough. Some nausea and diarrhea. No alteration in taste or smell.  Pt denies cp, palp,   In ED T 99.1, P 126, R 28, Bp 131/84  pxo 95% on 4L [place]  Wbc 5.9, Hgb 13.4 Plt 142 Na 136, K 3.9, Bun 10, Creatinine 0.73 Ast 74, Alt 61,  Alb 3.0 LDH 270 Ferritin 362 Tg 361 Crp 6.5   CXR IMPRESSION: Perihilar and left basilar ground-glass opacity, may reflect atypical/viral pneumonia.  Ekg  ST at 113, nl axis, early R progession, no st-t changes c/w ischemia  Pt will be admitted for acute respiratory failure with hypoxia      Review of systems:    In addition to the HPI above,    No Headache, No changes with Vision or hearing, No problems swallowing food or Liquids, No Chest pain,  No Abdominal pain, N No Blood in stool or Urine, No dysuria, No new skin rashes or bruises, No new joints pains-aches,  No new weakness, tingling, numbness in any extremity, No recent weight gain or loss, No polyuria, polydypsia or polyphagia, No significant Mental Stressors.  All other systems reviewed and are negative.    Past History of the following :    Past Medical History:  Diagnosis Date  . Headache(784.0)   . History of kidney  stones   . Hypertension   . Urinary incontinence       Past Surgical History:  Procedure Laterality Date  . DILATION AND EVACUATION  11/03/2011   Procedure: DILATATION AND EVACUATION;  Surgeon: Levi Aland, MD;  Location: [facility];  Service: Gynecology;  Laterality: N/A;      Social History:      Social History   Tobacco Use  . Smoking status: Never Smoker  . Smokeless tobacco: Never Used  Substance Use Topics  . Alcohol use: No       Family History :     Family History  Problem Relation Age of Onset  . Diabetes Father   . Hypertension Father   . Hyperlipidemia Father   . Diabetes Maternal Aunt   . Diabetes Maternal Grandmother   . Diabetes Maternal Grandfather        Home Medications:   Prior to Admission medications   Medication Sig Start Date End Date Taking? Authorizing Provider  azithromycin (ZITHROMAX Z-PAK) 250 MG tablet As directed Patient taking differently: Take 250-500 mg by mouth See admin instructions. 500mg  on day 1 then take 250mg  on days 2 through 5 05/20/19  Yes Daphine DeutscherMartin, Mary-Margaret, FNP  eletriptan (RELPAX) 40 MG tablet TAKE 1 TABLET AT ONSET OF HEADACHE, MAY REPEAT ONCE IN 2 HOURS Patient taking differently: Take 40 mg by mouth every 2 (two) hours as needed for migraine.  05/06/19  Yes Martin, Mary-Margaret, FNP  Dorise HissErenumab-aooe (AIMOVIG) 70 MG/ML SOAJ Inject 70 mg into the skin every 30 (thirty) days. 07/20/18  Yes Martin, Mary-Margaret, FNP  escitalopram (LEXAPRO) 20 MG tablet Take 1 tablet (20 mg total) by mouth daily. 01/31/19  Yes Daphine DeutscherMartin, Mary-Margaret, FNP  etonogestrel-ethinyl estradiol (NUVARING) 0.12-0.015 MG/24HR vaginal ring Place 1 each vaginally every 28 (twenty-eight) days. Insert vaginally and leave in place for 3 consecutive weeks, then remove for 1 week.   Yes [provider]  lisinopril (ZESTRIL) 20 MG tablet Take 1 tablet (20 mg total) by mouth daily. (Needs to be seen before next refill) 01/31/19  Yes Daphine DeutscherMartin, Mary-Margaret,  FNP  Multiple Vitamin (MULTIVITAMIN) tablet Take 1 tablet by mouth daily.   Yes [provider]  predniSONE (DELTASONE) 20 MG tablet 2 po at sametime daily for 5 days Patient taking differently: Take 40 mg by mouth daily. 5 day course starting on 05/20/2019 05/20/19  Yes Daphine DeutscherMartin, Mary-Margaret, FNP  naproxen (NAPROSYN) 500 MG tablet Take 1 tablet (500 mg total) by mouth 2 (two) times daily with a meal. Patient not taking: Reported on 05/22/2019 03/22/19   Bennie PieriniMartin, Mary-Margaret, FNP     Allergies:    No Known Allergies   Physical Exam:   Vitals  Blood pressure 109/73, pulse (!) 108, temperature 99.1 F (37.3 C), temperature source Oral, resp. rate (!) 27, height 5\' 3"  (1.6 m), weight 129.3 kg, SpO2 95 %.  1.  General:  axoxo3  2. Psychiatric: euthymic  3. Neurologic: nonfocal  4. HEENMT:  Anicteric, pupils 1.365mm symmetric, direct, consensual, near intact Neck: no jvd  5. Respiratory : CTAB  6. Cardiovascular : rrr s1, s2,   7. Gastrointestinal:  Abd: soft, nt, nd, +bs  8. Skin:  Ext: no c/c/e,  No rash  9.Musculoskeletal:  Good ROM    Data Review:    CBC Recent Labs  Lab 05/22/19 1820  WBC 5.9  HGB 13.4  HCT 41.9  PLT 142*  MCV 91.3  MCH 29.2  MCHC 32.0  RDW 13.0  LYMPHSABS 1.4  MONOABS 0.3  EOSABS 0.0  BASOSABS 0.0   ------------------------------------------------------------------------------------------------------------------  Results for orders placed or performed during the hospital encounter of 05/22/19 (from the past 48 hour(s))  Lactic acid, plasma     Status: Abnormal   Collection Time: 05/22/19  6:20 PM  Result Value Ref Range   Lactic Acid, Venous 2.1 (HH) 0.5 - 1.9 mmol/L    Comment: CRITICAL RESULT CALLED TO, READ BACK BY AND VERIFIED WITH: EASTER,T ON 05/22/19 AT 1920 BY LOY,C Performed at [facility], [address] [place], [place] [id number]   CBC WITH DIFFERENTIAL     Status: Abnormal   Collection Time: 05/22/19   6:20 PM  Result Value Ref Range   WBC 5.9 4.0 - 10.5 K/uL   RBC 4.59 3.87 - 5.11 MIL/uL   Hemoglobin 13.4 12.0 - 15.0 g/dL   HCT 47.441.9 25.936.0 - 56.346.0 %   MCV 91.3 80.0 - 100.0 fL   MCH 29.2 26.0 - 34.0 pg  MCHC 32.0 30.0 - 36.0 g/dL   RDW 50.0 93.8 - 18.2 %   Platelets 142 (L) 150 - 400 K/uL   nRBC 0.0 0.0 - 0.2 %   Neutrophils Relative % 71 %   Neutro Abs 4.1 1.7 - 7.7 K/uL   Lymphocytes Relative 24 %   Lymphs Abs 1.4 0.7 - 4.0 K/uL   Monocytes Relative 5 %   Monocytes Absolute 0.3 0.1 - 1.0 K/uL   Eosinophils Relative 0 %   Eosinophils Absolute 0.0 0.0 - 0.5 K/uL   Basophils Relative 0 %   Basophils Absolute 0.0 0.0 - 0.1 K/uL   Immature Granulocytes 0 %   Abs Immature Granulocytes 0.02 0.00 - 0.07 K/uL    Comment: Performed at [facility], [address], [place], [place] [address]  Comprehensive metabolic panel     Status: Abnormal   Collection Time: 05/22/19  6:20 PM  Result Value Ref Range   Sodium 136 135 - 145 mmol/L   Potassium 3.9 3.5 - 5.1 mmol/L   Chloride 105 98 - 111 mmol/L   CO2 22 22 - 32 mmol/L   Glucose, Bld 115 (H) 70 - 99 mg/dL   BUN 10 6 - 20 mg/dL   Creatinine, Ser 6.96 0.44 - 1.00 mg/dL   Calcium 8.5 (L) 8.9 - 10.3 mg/dL   Total Protein 6.0 (L) 6.5 - 8.1 g/dL   Albumin 3.0 (L) 3.5 - 5.0 g/dL   AST 74 (H) 15 - 41 U/L   ALT 61 (H) 0 - 44 U/L   Alkaline Phosphatase 78 38 - 126 U/L   Total Bilirubin 0.3 0.3 - 1.2 mg/dL   GFR calc non Af Amer >60 >60 mL/min   GFR calc Af Amer >60 >60 mL/min   Anion gap 9 5 - 15    Comment: Performed at [facility], [address], [place], [place] [address]  D-dimer, quantitative     Status: None   Collection Time: 05/22/19  6:20 PM  Result Value Ref Range   D-Dimer, Quant 0.37 0.00 - 0.50 ug/mL-FEU    Comment: (NOTE) At the manufacturer cut-off of 0.50 ug/mL FEU, this assay has been documented to exclude PE with a sensitivity and negative predictive value of 97 to 99%.  At this time, this assay has not been  approved by the FDA to exclude DVT/VTE. Results should be correlated with clinical presentation. Performed at [facility], [address] [place], [place] [address]   Lactate dehydrogenase     Status: Abnormal   Collection Time: 05/22/19  6:20 PM  Result Value Ref Range   LDH 270 (H) 98 - 192 U/L    Comment: Performed at [facility], [address], [place], [place] [address]  Ferritin     Status: Abnormal   Collection Time: 05/22/19  6:20 PM  Result Value Ref Range   Ferritin 362 (H) 11 - 307 ng/mL    Comment: Performed at [facility], [address], [place], [place] [address]  Triglycerides     Status: Abnormal   Collection Time: 05/22/19  6:20 PM  Result Value Ref Range   Triglycerides 361 (H) <150 mg/dL    Comment: Performed at [facility], [address], [place], [place] [address]  Fibrinogen     Status: None   Collection Time: 05/22/19  6:20 PM  Result Value Ref Range   Fibrinogen 464 210 - 475 mg/dL    Comment: Performed at [facility], [address]  [place], [place] [address]  C-reactive protein     Status: Abnormal   Collection Time: 05/22/19  6:20 PM  Result Value Ref Range   CRP 6.5 (H) <1.0 mg/dL    Comment: Performed at [facility], [address] [place], [place] [address]  POC urine preg, ED     Status: None   Collection Time: 05/22/19  8:18 PM  Result Value Ref Range   Preg Test, Ur NEGATIVE NEGATIVE    Comment:        THE SENSITIVITY OF THIS METHODOLOGY IS >24 mIU/mL     Chemistries  Recent Labs  Lab 05/22/19 1820  NA 136  K 3.9  CL 105  CO2 22  GLUCOSE 115*  BUN 10  CREATININE 0.73  CALCIUM 8.5*  AST 74*  ALT 61*  ALKPHOS 78  BILITOT 0.3   ------------------------------------------------------------------------------------------------------------------  ------------------------------------------------------------------------------------------------------------------ GFR: Estimated Creatinine Clearance: 127.7 mL/min (by C-G formula  based on SCr of 0.73 mg/dL). Liver Function Tests: Recent Labs  Lab 05/22/19 1820  AST 74*  ALT 61*  ALKPHOS 78  BILITOT 0.3  PROT 6.0*  ALBUMIN 3.0*   No results for input(s): LIPASE, AMYLASE in the last 168 hours. No results for input(s): AMMONIA in the last 168 hours. Coagulation Profile: No results for input(s): INR, PROTIME in the last 168 hours. Cardiac Enzymes: No results for input(s): CKTOTAL, CKMB, CKMBINDEX, TROPONINI in the last 168 hours. BNP (last 3 results) No results for input(s): PROBNP in the last 8760 hours. HbA1C: No results for input(s): HGBA1C in the last 72 hours. CBG: No results for input(s): GLUCAP in the last 168 hours. Lipid Profile: Recent Labs    05/22/19 1820  TRIG 361*   Thyroid Function Tests: No results for input(s): TSH, T4TOTAL, FREET4, T3FREE, THYROIDAB in the last 72 hours. Anemia Panel: Recent Labs    05/22/19 1820  FERRITIN 362*    --------------------------------------------------------------------------------------------------------------- Urine analysis:    Component Value Date/Time   COLORURINE YELLOW 09/28/2012 1225   APPEARANCEUR Hazy (A) 04/09/2016 0958   LABSPEC 1.020 09/28/2012 1225   PHURINE 6.0 09/28/2012 1225   GLUCOSEU Negative 04/09/2016 0958   HGBUR TRACE (A) 09/28/2012 1225   BILIRUBINUR n 01/24/2018 1506   BILIRUBINUR Negative 04/09/2016 0958   KETONESUR 15 (A) 09/28/2012 1225   PROTEINUR Negative 01/24/2018 1506   PROTEINUR 1+ (A) 04/09/2016 0958   PROTEINUR NEGATIVE 09/28/2012 1225   UROBILINOGEN 0.2 01/24/2018 1506   UROBILINOGEN 0.2 09/28/2012 1225   NITRITE n 01/24/2018 1506   NITRITE Negative 04/09/2016 0958   NITRITE NEGATIVE 09/28/2012 1225   LEUKOCYTESUR Moderate (2+) (A) 01/24/2018 1506   LEUKOCYTESUR 1+ (A) 04/09/2016 0958      Imaging Results:    Dg Chest Port 1 View  Result Date: 05/22/2019 CLINICAL DATA:  Shortness of breath EXAM: PORTABLE CHEST 1 VIEW COMPARISON:  08/17/2012  FINDINGS: Low lung volumes. Perihilar and left basilar interstitial and ground-glass opacity. Normal heart size. No pneumothorax IMPRESSION: Perihilar and left basilar ground-glass opacity, may reflect atypical/viral pneumonia. Electronically Signed   By: Donavan Foil M.D.   On: 05/22/2019 19:38       Assessment & Plan:    Principal Problem:   COVID-19 virus infection Active Problems:   Acute respiratory failure due to COVID-19 (HCC)   Abnormal liver function  Acute respiratory failure w hypoxia.  Secondary to Covid-19 infection/ atypical pneumonia Dexamethasone 6mg  iv qday Remdesivir per pharmacy  Zithromax 500mg  iv qday  Covid-19 infection Dexamethasone, Remdesivir as above  Abnormal liver function Check acute hepatitis panel Check cpk Check RUQ ultrasound Check cmp in am  DVT Prophylaxis-   Lovenox - SCDs   AM Labs Ordered, also please review Full Orders  Family Communication: Admission, patients condition and plan of care including tests being ordered have been discussed with the patient  who indicate understanding and agree with the plan and Code Status.  Code Status:  FULL CODE per patient  Admission status: inpatient: Based on patients clinical presentation and evaluation of above clinical data, I have made determination that patient meets Inpatient criteria at this time.   Pt has acute respiratory failure with hypoxia, secondary to covid-19 infection / atypical pneumonia.   Time spent in minutes : 70 minutes   Pearson Grippe M.D on 05/22/2019 at 8:35 PM

## 2019-05-22 NOTE — ED Notes (Signed)
Date and time results received: 05/22/19 1926 (use smartphrase ".now" to insert current time)  Test: Lactic Acid Critical Value: 2.1  Name of Provider Notified: Rancour  Orders Received? Or Actions Taken?:

## 2019-05-22 NOTE — ED Provider Notes (Signed)
[facility] EMERGENCY DEPARTMENT Provider Note   CSN: [id number] Arrival date & time: 05/22/19  1743     History   Chief Complaint Chief Complaint  Patient presents with  . Shortness of Breath    HPI Laura Combs is a 38 y.o. female.     Level 5 caveat for respiratory distress.  Patient brought in by EMS with increasing shortness of breath and congestion, weakness and body aches.  She was diagnosed with coronavirus 2 days ago after having a test 3 days ago.  States she has been sick about the past 4 to 5 days with generalized weakness, body aches, shortness of breath and chills and fever.  Sick contacts at home.  Hypoxic to 80% on fire department arrival.  Placed on oxygen on arrival with increase in her saturations to the mid 90s.  Febrile and tachycardic on arrival.  She denies any history of asthma, COPD, acid reflux.  She does not smoke.  Denies any abdominal pain.  Denies any vomiting or diarrhea.  No recent out of the country travel but has had sick contacts.  The history is provided by the patient and the EMS personnel. The history is limited by the condition of the patient.  Shortness of Breath Associated symptoms: cough, fever and headaches   Associated symptoms: no abdominal pain, no chest pain and no vomiting     Past Medical History:  Diagnosis Date  . Headache(784.0)   . History of kidney stones   . Hypertension   . Urinary incontinence     Patient Active Problem List   Diagnosis Date Noted  . Hypertensive disorder 02/08/2017  . GAD (generalized anxiety disorder) 03/02/2016  . Morbid obesity with BMI of 45.0-49.9, adult (HCC) 03/02/2016  . Migraine 09/28/2015    Past Surgical History:  Procedure Laterality Date  . DILATION AND EVACUATION  11/03/2011   Procedure: DILATATION AND EVACUATION;  Surgeon: Levi Aland, MD;  Location: [facility];  Service: Gynecology;  Laterality: N/A;     OB History    Gravida  3   Para  2   Term  2   Preterm  0   AB   1   Living  2     SAB  1   TAB  0   Ectopic  0   Multiple  0   Live Births  2            Home Medications    Prior to Admission medications   Medication Sig Start Date End Date Taking? Authorizing Provider  azithromycin (ZITHROMAX Z-PAK) 250 MG tablet As directed 05/20/19   Daphine Deutscher, Mary-Margaret, FNP  eletriptan (RELPAX) 40 MG tablet TAKE 1 TABLET AT ONSET OF HEADACHE, MAY REPEAT ONCE IN 2 HOURS 05/06/19   Daphine Deutscher, Mary-Margaret, FNP  Erenumab-aooe (AIMOVIG) 70 MG/ML SOAJ Inject 70 mg into the skin every 30 (thirty) days. 07/20/18   Daphine Deutscher, Mary-Margaret, FNP  escitalopram (LEXAPRO) 20 MG tablet Take 1 tablet (20 mg total) by mouth daily. 01/31/19   Bennie Pierini, FNP  etonogestrel-ethinyl estradiol (NUVARING) 0.12-0.015 MG/24HR vaginal ring Place 1 each vaginally every 28 (twenty-eight) days. Insert vaginally and leave in place for 3 consecutive weeks, then remove for 1 week.    [provider]  lisinopril (ZESTRIL) 20 MG tablet Take 1 tablet (20 mg total) by mouth daily. (Needs to be seen before next refill) 01/31/19   Bennie Pierini, FNP  Multiple Vitamin (MULTIVITAMIN) tablet Take 1 tablet by mouth daily.  [provider]  naproxen (NAPROSYN) 500 MG tablet Take 1 tablet (500 mg total) by mouth 2 (two) times daily with a meal. 03/22/19   Daphine Deutscher, Mary-Margaret, FNP  predniSONE (DELTASONE) 20 MG tablet 2 po at sametime daily for 5 days 05/20/19   Bennie Pierini, FNP    Family History Family History  Problem Relation Age of Onset  . Diabetes Father   . Hypertension Father   . Hyperlipidemia Father   . Diabetes Maternal Aunt   . Diabetes Maternal Grandmother   . Diabetes Maternal Grandfather     Social History Social History   Tobacco Use  . Smoking status: Never Smoker  . Smokeless tobacco: Never Used  Substance Use Topics  . Alcohol use: No  . Drug use: No     Allergies   Patient has no known allergies.   Review of  Systems Review of Systems  Constitutional: Positive for activity change, appetite change, chills, fatigue and fever.  HENT: Positive for congestion. Negative for rhinorrhea.   Respiratory: Positive for cough and shortness of breath.   Cardiovascular: Negative for chest pain.  Gastrointestinal: Negative for abdominal pain, nausea and vomiting.  Genitourinary: Negative for dysuria and hematuria.  Musculoskeletal: Positive for arthralgias and myalgias.  Neurological: Positive for weakness and headaches.   all other systems are negative except as noted in the HPI and PMH.     Physical Exam Updated Vital Signs BP 131/84 (BP Location: Right Arm)   Pulse (!) 126   Temp 99.1 F (37.3 C) (Oral)   Resp (!) 28   Ht 5\' 3"  (1.6 m)   Wt 129.3 kg   SpO2 95%   BMI 50.49 kg/m   Physical Exam Vitals signs and nursing note reviewed.  Constitutional:      General: She is in acute distress.     Appearance: She is well-developed. She is obese.     Comments: Increased work of breathing, tachypneic  HENT:     Head: Normocephalic and atraumatic.     Mouth/Throat:     Pharynx: No oropharyngeal exudate.  Eyes:     Conjunctiva/sclera: Conjunctivae normal.     Pupils: Pupils are equal, round, and reactive to light.  Neck:     Musculoskeletal: Normal range of motion and neck supple.     Comments: No meningismus. Cardiovascular:     Rate and Rhythm: Regular rhythm. Tachycardia present.     Heart sounds: Normal heart sounds. No murmur.  Pulmonary:     Effort: Respiratory distress present.     Breath sounds: Normal breath sounds.  Abdominal:     Palpations: Abdomen is soft.     Tenderness: There is no abdominal tenderness. There is no guarding or rebound.  Musculoskeletal: Normal range of motion.        General: No tenderness.  Skin:    General: Skin is warm.     Capillary Refill: Capillary refill takes less than 2 seconds.  Neurological:     General: No focal deficit present.     Mental  Status: She is alert and oriented to person, place, and time. Mental status is at baseline.     Cranial Nerves: No cranial nerve deficit.     Motor: No abnormal muscle tone.     Coordination: Coordination normal.     Comments:  5/5 strength throughout. CN 2-12 intact.Equal grip strength.   Psychiatric:        Behavior: Behavior normal.      ED Treatments /  Results  Labs (all labs ordered are listed, but only abnormal results are displayed) Labs Reviewed  LACTIC ACID, PLASMA - Abnormal; Notable for the following components:      Result Value   Lactic Acid, Venous 2.1 (*)    All other components within normal limits  CBC WITH DIFFERENTIAL/PLATELET - Abnormal; Notable for the following components:   Platelets 142 (*)    All other components within normal limits  COMPREHENSIVE METABOLIC PANEL - Abnormal; Notable for the following components:   Glucose, Bld 115 (*)    Calcium 8.5 (*)    Total Protein 6.0 (*)    Albumin 3.0 (*)    AST 74 (*)    ALT 61 (*)    All other components within normal limits  LACTATE DEHYDROGENASE - Abnormal; Notable for the following components:   LDH 270 (*)    All other components within normal limits  FERRITIN - Abnormal; Notable for the following components:   Ferritin 362 (*)    All other components within normal limits  TRIGLYCERIDES - Abnormal; Notable for the following components:   Triglycerides 361 (*)    All other components within normal limits  C-REACTIVE PROTEIN - Abnormal; Notable for the following components:   CRP 6.5 (*)    All other components within normal limits  CULTURE, BLOOD (ROUTINE X 2)  CULTURE, BLOOD (ROUTINE X 2)  LACTIC ACID, PLASMA  D-DIMER, QUANTITATIVE (NOT AT [facility])  FIBRINOGEN  PROCALCITONIN  HIV ANTIBODY (ROUTINE TESTING W REFLEX)  CBC WITH DIFFERENTIAL/PLATELET  COMPREHENSIVE METABOLIC PANEL  HEPATITIS PANEL, ACUTE  POC URINE PREG, ED  ABO/RH  TROPONIN I (HIGH SENSITIVITY)  TROPONIN I (HIGH SENSITIVITY)     EKG None  Radiology Dg Chest Port 1 View  Result Date: 05/22/2019 CLINICAL DATA:  Shortness of breath EXAM: PORTABLE CHEST 1 VIEW COMPARISON:  08/17/2012 FINDINGS: Low lung volumes. Perihilar and left basilar interstitial and ground-glass opacity. Normal heart size. No pneumothorax IMPRESSION: Perihilar and left basilar ground-glass opacity, may reflect atypical/viral pneumonia. Electronically Signed   By: Donavan Foil M.D.   On: 05/22/2019 19:38    Procedures Procedures (including critical care time)  Medications Ordered in ED Medications - No data to display   Initial Impression / Assessment and Plan / ED Course  I have reviewed the triage vital signs and the nursing notes.  Pertinent labs & imaging results that were available during my care of the patient were reviewed by me and considered in my medical decision making (see chart for details).       Known COVID+ here with SOB, cough, fever. Aches all over.  Increased work of breathing.  Xray with infiltrates.  Inflammatory markers elevated.  D-dimer negative.  Low suspicion for PE or ACS.  Decadron given due to new O2 requirement.  Plan admission.  Dw/ Dr. Ashley Royalty Ionescu was evaluated in Emergency Department on 05/23/2019 for the symptoms described in the history of present illness. She was evaluated in the context of the global COVID-19 pandemic, which necessitated consideration that the patient might be at risk for infection with the SARS-CoV-2 virus that causes COVID-19. Institutional protocols and algorithms that pertain to the evaluation of patients at risk for COVID-19 are in a state of rapid change based on information released by regulatory bodies including the CDC and federal and state organizations. These policies and algorithms were followed during the patient's care in the ED.   Final Clinical Impressions(s) / ED Diagnoses   Final diagnoses:  None    ED Discharge Orders    None        Jaxie Racanelli, Jeannett SeniorStephen, MD 05/23/19 [id number]

## 2019-05-23 ENCOUNTER — Other Ambulatory Visit: Payer: Self-pay

## 2019-05-23 DIAGNOSIS — J9601 Acute respiratory failure with hypoxia: Secondary | ICD-10-CM

## 2019-05-23 LAB — CBC
HCT: 40.2 % (ref 36.0–46.0)
Hemoglobin: 12.7 g/dL (ref 12.0–15.0)
MCH: 28.6 pg (ref 26.0–34.0)
MCHC: 31.6 g/dL (ref 30.0–36.0)
MCV: 90.5 fL (ref 80.0–100.0)
Platelets: 141 10*3/uL — ABNORMAL LOW (ref 150–400)
RBC: 4.44 MIL/uL (ref 3.87–5.11)
RDW: 12.9 % (ref 11.5–15.5)
WBC: 4 10*3/uL (ref 4.0–10.5)
nRBC: 0 % (ref 0.0–0.2)

## 2019-05-23 LAB — COMPREHENSIVE METABOLIC PANEL
ALT: 53 U/L — ABNORMAL HIGH (ref 0–44)
AST: 53 U/L — ABNORMAL HIGH (ref 15–41)
Albumin: 2.8 g/dL — ABNORMAL LOW (ref 3.5–5.0)
Alkaline Phosphatase: 74 U/L (ref 38–126)
Anion gap: 12 (ref 5–15)
BUN: 8 mg/dL (ref 6–20)
CO2: 18 mmol/L — ABNORMAL LOW (ref 22–32)
Calcium: 8.2 mg/dL — ABNORMAL LOW (ref 8.9–10.3)
Chloride: 107 mmol/L (ref 98–111)
Creatinine, Ser: 0.67 mg/dL (ref 0.44–1.00)
GFR calc Af Amer: >60 mL/min (ref >60)
GFR calc non Af Amer: >60 mL/min (ref >60)
Glucose, Bld: 154 mg/dL — ABNORMAL HIGH (ref 70–99)
Potassium: 4 mmol/L (ref 3.5–5.1)
Sodium: 137 mmol/L (ref 135–145)
Total Bilirubin: 0.2 mg/dL — ABNORMAL LOW (ref 0.3–1.2)
Total Protein: 6 g/dL — ABNORMAL LOW (ref 6.5–8.1)

## 2019-05-23 LAB — HEMOGLOBIN A1C
Hgb A1c MFr Bld: 6.3 % — ABNORMAL HIGH (ref 4.8–5.6)
Mean Plasma Glucose: 134.11 mg/dL

## 2019-05-23 LAB — SAMPLE TO BLOOD BANK

## 2019-05-23 LAB — GLUCOSE, CAPILLARY
Glucose-Capillary: 140 mg/dL — ABNORMAL HIGH (ref 70–99)
Glucose-Capillary: 146 mg/dL — ABNORMAL HIGH (ref 70–99)
Glucose-Capillary: 152 mg/dL — ABNORMAL HIGH (ref 70–99)
Glucose-Capillary: 98 mg/dL (ref 70–99)

## 2019-05-23 LAB — D-DIMER, QUANTITATIVE: D-Dimer, Quant: 0.38 ug/mL-FEU (ref 0.00–0.50)

## 2019-05-23 LAB — MRSA PCR SCREENING: MRSA by PCR: NEGATIVE

## 2019-05-23 LAB — TYPE AND SCREEN
ABO/RH(D): A POS
Antibody Screen: NEGATIVE

## 2019-05-23 LAB — FERRITIN: Ferritin: 306 ng/mL (ref 11–307)

## 2019-05-23 LAB — C-REACTIVE PROTEIN: CRP: 8.2 mg/dL — ABNORMAL HIGH (ref <1.0)

## 2019-05-23 MED ORDER — SODIUM CHLORIDE 0.9 % IV SOLN
100.0000 mg | INTRAVENOUS | Status: AC
  Administered 2019-05-24 – 2019-05-27 (×4): 100 mg via INTRAVENOUS
  Filled 2019-05-23 (×4): qty 100

## 2019-05-23 MED ORDER — ESCITALOPRAM OXALATE 10 MG PO TABS
20.0000 mg | ORAL_TABLET | Freq: Every day | ORAL | Status: DC
  Administered 2019-05-23 – 2019-05-27 (×5): 20 mg via ORAL
  Filled 2019-05-23 (×5): qty 2

## 2019-05-23 MED ORDER — ADULT MULTIVITAMIN W/MINERALS CH
1.0000 | ORAL_TABLET | Freq: Every day | ORAL | Status: DC
  Administered 2019-05-23 – 2019-05-27 (×5): 1 via ORAL
  Filled 2019-05-23 (×6): qty 1

## 2019-05-23 MED ORDER — BENZONATATE 100 MG PO CAPS
200.0000 mg | ORAL_CAPSULE | Freq: Three times a day (TID) | ORAL | Status: DC
  Administered 2019-05-23 – 2019-05-27 (×11): 200 mg via ORAL
  Filled 2019-05-23 (×12): qty 2

## 2019-05-23 MED ORDER — LISINOPRIL 10 MG PO TABS
20.0000 mg | ORAL_TABLET | Freq: Every day | ORAL | Status: DC
  Administered 2019-05-23 – 2019-05-27 (×5): 20 mg via ORAL
  Filled 2019-05-23 (×7): qty 2

## 2019-05-23 MED ORDER — FAMOTIDINE 20 MG PO TABS
20.0000 mg | ORAL_TABLET | Freq: Every day | ORAL | Status: DC
  Administered 2019-05-23 – 2019-05-27 (×5): 20 mg via ORAL
  Filled 2019-05-23 (×6): qty 1

## 2019-05-23 MED ORDER — DIPHENHYDRAMINE HCL 50 MG/ML IJ SOLN
25.0000 mg | Freq: Once | INTRAMUSCULAR | Status: AC
  Administered 2019-05-23: 25 mg via INTRAVENOUS
  Filled 2019-05-23: qty 1

## 2019-05-23 MED ORDER — FUROSEMIDE 10 MG/ML IJ SOLN
40.0000 mg | Freq: Once | INTRAMUSCULAR | Status: AC
  Administered 2019-05-23: 40 mg via INTRAVENOUS
  Filled 2019-05-23: qty 4

## 2019-05-23 MED ORDER — ACETAMINOPHEN 325 MG PO TABS
650.0000 mg | ORAL_TABLET | Freq: Once | ORAL | Status: AC
  Administered 2019-05-23: 650 mg via ORAL
  Filled 2019-05-23: qty 2

## 2019-05-23 MED ORDER — SODIUM CHLORIDE 0.9% IV SOLUTION
Freq: Once | INTRAVENOUS | Status: AC
  Administered 2019-05-23: 13:00:00 via INTRAVENOUS

## 2019-05-23 MED ORDER — POLYETHYLENE GLYCOL 3350 17 G PO PACK: 17.0000 g | PACK | Freq: Every day | ORAL | Status: DC | PRN

## 2019-05-23 MED ORDER — CLONAZEPAM 0.5 MG PO TABS
0.5000 mg | ORAL_TABLET | Freq: Two times a day (BID) | ORAL | Status: DC | PRN
  Administered 2019-05-23 – 2019-05-26 (×4): 0.5 mg via ORAL
  Filled 2019-05-23 (×4): qty 1

## 2019-05-23 MED ORDER — ALBUTEROL SULFATE HFA 108 (90 BASE) MCG/ACT IN AERS
2.0000 | INHALATION_SPRAY | RESPIRATORY_TRACT | Status: DC | PRN
  Filled 2019-05-23: qty 6.7

## 2019-05-23 MED ORDER — ONDANSETRON HCL 4 MG/2ML IJ SOLN: 4.0000 mg | Freq: Four times a day (QID) | INTRAMUSCULAR | Status: DC | PRN

## 2019-05-23 MED ORDER — METHYLPREDNISOLONE SODIUM SUCC 125 MG IJ SOLR
60.0000 mg | Freq: Two times a day (BID) | INTRAMUSCULAR | Status: DC
  Administered 2019-05-23 – 2019-05-25 (×6): 60 mg via INTRAVENOUS
  Filled 2019-05-23 (×7): qty 2

## 2019-05-23 MED ORDER — HYDROCOD POLST-CPM POLST ER 10-8 MG/5ML PO SUER
5.0000 mL | Freq: Two times a day (BID) | ORAL | Status: DC | PRN
  Administered 2019-05-23 – 2019-05-26 (×4): 5 mL via ORAL
  Filled 2019-05-23 (×4): qty 5

## 2019-05-23 MED ORDER — INSULIN ASPART 100 UNIT/ML ~~LOC~~ SOLN
0.0000 [IU] | Freq: Three times a day (TID) | SUBCUTANEOUS | Status: DC
  Administered 2019-05-23 (×2): 1 [IU] via SUBCUTANEOUS
  Administered 2019-05-23 – 2019-05-24 (×2): 2 [IU] via SUBCUTANEOUS
  Administered 2019-05-24 – 2019-05-25 (×3): 1 [IU] via SUBCUTANEOUS
  Administered 2019-05-25 (×2): 2 [IU] via SUBCUTANEOUS
  Administered 2019-05-26: 1 [IU] via SUBCUTANEOUS
  Administered 2019-05-26: 2 [IU] via SUBCUTANEOUS
  Administered 2019-05-26: 1 [IU] via SUBCUTANEOUS

## 2019-05-23 MED ORDER — SODIUM CHLORIDE 0.9 % IV SOLN
200.0000 mg | Freq: Once | INTRAVENOUS | Status: AC
  Administered 2019-05-23: 200 mg via INTRAVENOUS
  Filled 2019-05-23: qty 40

## 2019-05-23 MED ORDER — MENTHOL 3 MG MT LOZG
1.0000 | LOZENGE | OROMUCOSAL | Status: DC | PRN
  Filled 2019-05-23: qty 9

## 2019-05-23 MED ORDER — ENOXAPARIN SODIUM 80 MG/0.8ML ~~LOC~~ SOLN
65.0000 mg | SUBCUTANEOUS | Status: DC
  Administered 2019-05-23: 65 mg via SUBCUTANEOUS
  Filled 2019-05-23: qty 0.8

## 2019-05-23 NOTE — Progress Notes (Signed)
Pharmacy Note - Remdesivir Dosing  O:  ALT: 61 CXR: Perihilar and left basilar ground-glass opacity, may reflect atypical/viral pneumonia.  Requiring supplemental O2:  4L HFNC   A/P:  Patient meets criteria for remdesivir.  Begin remdesivir 200 mg IV x 1, followed by 100 mg IV daily x 4 days  Monitor ALT, clinical progress  Despina Pole, Pharm. D. Clinical Pharmacist 05/23/2019 3:43 AM

## 2019-05-23 NOTE — Progress Notes (Addendum)
PROGRESS NOTE                                                                                                                                                                                                             Patient Demographics:    Laura Combs, is a 38 y.o. female, DOB - [birth year], HGD:[id number]  Outpatient Primary MD for the patient is Chevis Pretty, FNP   Admit date - 05/22/2019   LOS - 1  Chief Complaint  Patient presents with  . Shortness of Breath       Brief Narrative: Patient is a 38 y.o. female with PMHx of hypertension, obesity-who was Covid positive on 11/13-presented to the hospital with fever, dyspnea and myalgias-found to have acute hypoxic respiratory failure secondary to COVID-19 pneumonia.  See below for further details.   Subjective:    Laura Combs today continues to have coughing spells-complains of a sore throat-on 2 L of oxygen.  Not in any distress.   Assessment  & Plan :   Acute Hypoxic Resp Failure due to Covid 19 Viral pneumonia: Seems to have improved compared to admission-but still anxious, coughing-plans are to continue steroids and remdesivir.  Will transfuse 1 unit of convalescent plasma today (rationale/risks/benefits discussed with patient at bedside).  Follow inflammatory markers.  Fever: afebrile  O2 requirements:  SpO2: 94 % O2 Flow Rate (L/min): 2 L/min   COVID-19 Labs: Recent Labs    05/22/19 1820  DDIMER 0.37  FERRITIN 362*  LDH 270*  CRP 6.5*    Lab Results  Component Value Date   SARSCOV2NAA Detected (A) 05/17/2019     COVID-19 Medications: Steroids: 11/18>> Remdesivir: 11/18>> Actemra: Not given Convalescent Plasma: Not given  Other medications: Diuretics:Euvolemic-Lasix x1 to maintain negative balance.  Antibiotics: Stop Zithromax on 11/19-no evidence of bacterial infection.  Prone/Incentive Spirometry: encouraged patient to  lie prone for 3-4 hours at a time for a total of 16 hours a day, and to encourage incentive spirometry use 3-4/hour.  DVT Prophylaxis  :  Lovenox  Transaminitis: Secondary to COVID-19-follow-doubt need for any further work-up as should normalize with time.  HTN: Controlled-continue lisinopril  Obesity: Estimated body mass index is 44.25 kg/m as calculated from the following:   Height as of this encounter: 5\' 3"  (1.6 m).   Weight as of this encounter: 113.3 kg.  Consults  :  None  Procedures  :  None  ABG: No results found for: PHART, PCO2ART, PO2ART, HCO3, TCO2, ACIDBASEDEF, O2SAT  Vent Settings: N/A  Condition - Stable  Family Communication  : spouse over the phone 11/19  Code Status :  Full Code  Diet :  Diet Order            Diet Heart Room service appropriate? Yes; Fluid consistency: Thin  Diet effective now               Disposition Plan  :  Remain hospitalized  Barriers to discharge: Hypoxia requiring O2 supplementation/complete 5 days of IV Remdesivir  Antimicorbials  :    Anti-infectives (From admission, onward)   Start     Dose/Rate Route Frequency Ordered Stop   05/24/19 1000  remdesivir 100 mg in sodium chloride 0.9 % 250 mL IVPB     100 mg 500 mL/hr over 30 Minutes Intravenous Every 24 hours 05/23/19 0448 05/28/19 0959   05/23/19 0515  remdesivir 200 mg in sodium chloride 0.9 % 250 mL IVPB     200 mg 500 mL/hr over 30 Minutes Intravenous Once 05/23/19 0448 05/23/19 0634   05/22/19 2315  azithromycin (ZITHROMAX) 500 mg in sodium chloride 0.9 % 250 mL IVPB  Status:  Discontinued     500 mg 250 mL/hr over 60 Minutes Intravenous Every 24 hours 05/22/19 2311 05/23/19 0734      Inpatient Medications  Scheduled Meds: . dexamethasone (DECADRON) injection  6 mg Intravenous Q24H  . enoxaparin (LOVENOX) injection  65 mg Subcutaneous Q24H  . escitalopram  20 mg Oral Daily  . lisinopril  20 mg Oral Daily  . multivitamin with minerals  1 tablet Oral  Daily  . sodium chloride flush  3 mL Intravenous Q12H   Continuous Infusions: . sodium chloride 250 mL (05/23/19 0600)  . [START ON 05/24/2019] remdesivir 100 mg in NS 250 mL     PRN Meds:.sodium chloride, acetaminophen, sodium chloride flush   Time Spent in minutes  25  See all Orders from today for further details   Jeoffrey Massed M.D on 05/23/2019 at 7:34 AM  To page go to [email or web address] - use universal password  [facility] -  Office  [phone]    Objective:   Vitals:   05/23/19 0200 05/23/19 0230 05/23/19 0300 05/23/19 0440  BP: 121/78 122/72 120/85 118/74  Pulse: 84 86 81 87  Resp: (!) 25 (!) 27 (!) 22 (!) 22  Temp:    98.8 F (37.1 C)  TempSrc:    Oral  SpO2: 92% 90% 91% 94%  Weight:    113.3 kg  Height:    5\' 3"  (1.6 m)    Wt Readings from Last 3 Encounters:  05/23/19 113.3 kg  03/22/19 125.6 kg  12/31/18 123.4 kg     Intake/Output Summary (Last 24 hours) at 05/23/2019 0734 Last data filed at 05/23/2019 0523 Gross per 24 hour  Intake 240 ml  Output -  Net 240 ml     Physical Exam Gen Exam:Alert awake-not in any distress HEENT:atraumatic, normocephalic Chest: B/L clear to auscultation anteriorly CVS:S1S2 regular Abdomen:soft non tender, non distended Extremities:no edema Neurology: Non focal Skin: no rash   Data Review:    CBC Recent Labs  Lab 05/22/19 1820  WBC 5.9  HGB 13.4  HCT 41.9  PLT 142*  MCV 91.3  MCH 29.2  MCHC 32.0  RDW 13.0  LYMPHSABS 1.4  MONOABS 0.3  EOSABS  0.0  BASOSABS 0.0    Chemistries  Recent Labs  Lab 05/22/19 1820  NA 136  K 3.9  CL 105  CO2 22  GLUCOSE 115*  BUN 10  CREATININE 0.73  CALCIUM 8.5*  AST 74*  ALT 61*  ALKPHOS 78  BILITOT 0.3   ------------------------------------------------------------------------------------------------------------------ Recent Labs    05/22/19 1820  TRIG 361*    Lab Results  Component Value Date   HGBA1C 5.5 07/26/2016    ------------------------------------------------------------------------------------------------------------------ No results for input(s): TSH, T4TOTAL, T3FREE, THYROIDAB in the last 72 hours.  Invalid input(s): FREET3 ------------------------------------------------------------------------------------------------------------------ Recent Labs    05/22/19 1820  FERRITIN 362*    Coagulation profile No results for input(s): INR, PROTIME in the last 168 hours.  Recent Labs    05/22/19 1820  DDIMER 0.37    Cardiac Enzymes No results for input(s): CKMB, TROPONINI, MYOGLOBIN in the last 168 hours.  Invalid input(s): CK ------------------------------------------------------------------------------------------------------------------ No results found for: BNP  Micro Results Recent Results (from the past 240 hour(s))  Novel Coronavirus, NAA ([facility])     Status: Abnormal   Collection Time: 05/17/19 12:00 AM   Specimen: Nasopharyngeal(NP) swabs in vial transport medium   NASOPHARYNGE  TESTING  Result Value Ref Range Status   SARS-CoV-2, NAA Detected (A) Not Detected Final    Comment: This nucleic acid amplification test was developed and its performance characteristics determined by [facility]. Nucleic acid amplification tests include PCR and TMA. This test has not been FDA cleared or approved. This test has been authorized by FDA under an Emergency Use Authorization (EUA). This test is only authorized for the duration of time the declaration that circumstances exist justifying the authorization of the emergency use of in vitro diagnostic tests for detection of SARS-CoV-2 virus and/or diagnosis of COVID-19 infection under section 564(b)(1) of the Act, 21 U.S.C. 161WRU-0(A360bbb-3(b) (1), unless the authorization is terminated or revoked sooner. When diagnostic testing is negative, the possibility of a false negative result should be considered in the context of a patient's  recent exposures and the presence of clinical signs and symptoms consistent with COVID-19. An individual without symptoms of COVID-19 and who is not shedding SARS-CoV-2 virus would  expect to have a negative (not detected) result in this assay.     Radiology Reports Dg Chest Port 1 View  Result Date: 05/22/2019 CLINICAL DATA:  Shortness of breath EXAM: PORTABLE CHEST 1 VIEW COMPARISON:  08/17/2012 FINDINGS: Low lung volumes. Perihilar and left basilar interstitial and ground-glass opacity. Normal heart size. No pneumothorax IMPRESSION: Perihilar and left basilar ground-glass opacity, may reflect atypical/viral pneumonia. Electronically Signed   By: Jasmine PangKim  Fujinaga M.D.   On: 05/22/2019 19:38

## 2019-05-23 NOTE — ED Notes (Signed)
Pt given meal tray.

## 2019-05-24 LAB — PREPARE FRESH FROZEN PLASMA

## 2019-05-24 LAB — BPAM FFP
Blood Product Expiration Date: 202011201229
ISSUE DATE / TIME: 202011191232
Unit Type and Rh: 600

## 2019-05-24 LAB — CBC
HCT: 41.8 % (ref 36.0–46.0)
Hemoglobin: 13.3 g/dL (ref 12.0–15.0)
MCH: 28.8 pg (ref 26.0–34.0)
MCHC: 31.8 g/dL (ref 30.0–36.0)
MCV: 90.5 fL (ref 80.0–100.0)
Platelets: 182 10*3/uL (ref 150–400)
RBC: 4.62 MIL/uL (ref 3.87–5.11)
RDW: 12.8 % (ref 11.5–15.5)
WBC: 7.7 10*3/uL (ref 4.0–10.5)
nRBC: 0 % (ref 0.0–0.2)

## 2019-05-24 LAB — COMPREHENSIVE METABOLIC PANEL
ALT: 69 U/L — ABNORMAL HIGH (ref 0–44)
AST: 67 U/L — ABNORMAL HIGH (ref 15–41)
Albumin: 3.1 g/dL — ABNORMAL LOW (ref 3.5–5.0)
Alkaline Phosphatase: 82 U/L (ref 38–126)
Anion gap: 12 (ref 5–15)
BUN: 9 mg/dL (ref 6–20)
CO2: 21 mmol/L — ABNORMAL LOW (ref 22–32)
Calcium: 8.4 mg/dL — ABNORMAL LOW (ref 8.9–10.3)
Chloride: 105 mmol/L (ref 98–111)
Creatinine, Ser: 0.72 mg/dL (ref 0.44–1.00)
GFR calc Af Amer: >60 mL/min (ref >60)
GFR calc non Af Amer: >60 mL/min (ref >60)
Glucose, Bld: 146 mg/dL — ABNORMAL HIGH (ref 70–99)
Potassium: 3.6 mmol/L (ref 3.5–5.1)
Sodium: 138 mmol/L (ref 135–145)
Total Bilirubin: 0.4 mg/dL (ref 0.3–1.2)
Total Protein: 6.8 g/dL (ref 6.5–8.1)

## 2019-05-24 LAB — GLUCOSE, CAPILLARY
Glucose-Capillary: 139 mg/dL — ABNORMAL HIGH (ref 70–99)
Glucose-Capillary: 172 mg/dL — ABNORMAL HIGH (ref 70–99)
Glucose-Capillary: 143 mg/dL — ABNORMAL HIGH (ref 70–99)
Glucose-Capillary: 128 mg/dL — ABNORMAL HIGH (ref 70–99)

## 2019-05-24 LAB — C-REACTIVE PROTEIN: CRP: 5.7 mg/dL — ABNORMAL HIGH (ref <1.0)

## 2019-05-24 LAB — D-DIMER, QUANTITATIVE: D-Dimer, Quant: 0.33 ug/mL-FEU (ref 0.00–0.50)

## 2019-05-24 LAB — FERRITIN: Ferritin: 431 ng/mL — ABNORMAL HIGH (ref 11–307)

## 2019-05-24 MED ORDER — FUROSEMIDE 10 MG/ML IJ SOLN
40.0000 mg | Freq: Once | INTRAMUSCULAR | Status: AC
  Administered 2019-05-24: 40 mg via INTRAVENOUS
  Filled 2019-05-24: qty 4

## 2019-05-24 MED ORDER — ENOXAPARIN SODIUM 60 MG/0.6ML ~~LOC~~ SOLN
60.0000 mg | SUBCUTANEOUS | Status: DC
  Administered 2019-05-24 – 2019-05-26 (×3): 60 mg via SUBCUTANEOUS
  Filled 2019-05-24 (×3): qty 0.6

## 2019-05-24 NOTE — Progress Notes (Signed)
PROGRESS NOTE                                                                                                                                                                                                             Patient Demographics:    Laura Combs, is a 38 y.o. female, DOB - 10-Jan-1981, MHD:[id number]  Outpatient Primary MD for the patient is Chevis Pretty, FNP   Admit date - 05/22/2019   LOS - 2  Chief Complaint  Patient presents with  . Shortness of Breath       Brief Narrative: Patient is a 39 y.o. female with PMHx of hypertension, obesity-who was Covid positive on 11/13-presented to the hospital with fever, dyspnea and myalgias-found to have acute hypoxic respiratory failure secondary to COVID-19 pneumonia.  See below for further details.   Subjective:    Laura Combs today feels better-less cough-she looks much more comfortable.  She was down to 1 L of oxygen.   Assessment  & Plan :   Acute Hypoxic Resp Failure due to Covid 19 Viral pneumonia: Improvement continues-continue remdesivir and steroids.  CRP is downtrending.   Fever: afebrile  O2 requirements:  SpO2: 92 % O2 Flow Rate (L/min): 1 L/min   COVID-19 Labs: Recent Labs    05/22/19 1820 05/23/19 0750 05/24/19 0054  DDIMER 0.37 0.38 0.33  FERRITIN 362* 306 431*  LDH 270*  --   --   CRP 6.5* 8.2* 5.7*    Lab Results  Component Value Date   SARSCOV2NAA Detected (A) 05/17/2019     COVID-19 Medications: Steroids: 11/18>> Remdesivir: 11/18>> Actemra: Not given Convalescent Plasma: 11/19 x 1  Other medications: Diuretics:Euvolemic-repeat Lasix x1 today Antibiotics: Stop Zithromax on 11/19-no evidence of bacterial infection.  Prone/Incentive Spirometry: encouraged patient to lie prone for 3-4 hours at a time for a total of 16 hours a day, and to encourage incentive spirometry use 3-4/hour.  DVT Prophylaxis  :  Lovenox   Transaminitis: Secondary to COVID-19-follow-doubt need for any further work-up as should normalize with time.  HTN: Controlled-continue lisinopril  Obesity: Estimated body mass index is 44.25 kg/m as calculated from the following:   Height as of this encounter: 5\' 3"  (1.6 m).   Weight as of this encounter: 113.3 kg.   Consults  :  None  Procedures  :  None  ABG: No results found for: PHART, PCO2ART, PO2ART, HCO3, TCO2, ACIDBASEDEF, O2SAT  Vent Settings: N/A  Condition - Stable  Family Communication  : spouse over the phone 11/20  Code Status :  Full Code  Diet :  Diet Order            Diet Heart Room service appropriate? Yes; Fluid consistency: Thin  Diet effective now               Disposition Plan  :  Remain hospitalized  Barriers to discharge: Hypoxia requiring O2 supplementation/complete 5 days of IV Remdesivir  Antimicorbials  :    Anti-infectives (From admission, onward)   Start     Dose/Rate Route Frequency Ordered Stop   05/24/19 1000  remdesivir 100 mg in sodium chloride 0.9 % 250 mL IVPB     100 mg 500 mL/hr over 30 Minutes Intravenous Every 24 hours 05/23/19 0448 05/28/19 0959   05/23/19 0515  remdesivir 200 mg in sodium chloride 0.9 % 250 mL IVPB     200 mg 500 mL/hr over 30 Minutes Intravenous Once 05/23/19 0448 05/23/19 0634   05/22/19 2315  azithromycin (ZITHROMAX) 500 mg in sodium chloride 0.9 % 250 mL IVPB  Status:  Discontinued     500 mg 250 mL/hr over 60 Minutes Intravenous Every 24 hours 05/22/19 2311 05/23/19 0734      Inpatient Medications  Scheduled Meds: . benzonatate  200 mg Oral TID  . enoxaparin (LOVENOX) injection  60 mg Subcutaneous Q24H  . escitalopram  20 mg Oral Daily  . famotidine  20 mg Oral Daily  . insulin aspart  0-9 Units Subcutaneous TID WC  . lisinopril  20 mg Oral Daily  . methylPREDNISolone (SOLU-MEDROL) injection  60 mg Intravenous Q12H  . multivitamin with minerals  1 tablet Oral Daily  . sodium chloride  flush  3 mL Intravenous Q12H   Continuous Infusions: . sodium chloride 250 mL (05/23/19 0600)  . remdesivir 100 mg in NS 250 mL 100 mg (05/24/19 0834)   PRN Meds:.sodium chloride, acetaminophen, albuterol, chlorpheniramine-HYDROcodone, clonazePAM, menthol-cetylpyridinium, ondansetron (ZOFRAN) IV, polyethylene glycol, sodium chloride flush   Time Spent in minutes  25  See all Orders from today for further details   Jeoffrey MassedShanker Ghimire M.D on 05/24/2019 at 2:23 PM  To page go to [email or web address] - use universal password  [facility] -  Office  [phone]    Objective:   Vitals:   05/23/19 1900 05/23/19 1945 05/23/19 2310 05/24/19 0800  BP:   (!) 111/55   Pulse: 96  85   Resp: (!) 21  (!) 28   Temp:  99.1 F (37.3 C) 99.1 F (37.3 C) 98.6 F (37 C)  TempSrc:  Oral Oral   SpO2: 92%     Weight:      Height:        Wt Readings from Last 3 Encounters:  05/23/19 113.3 kg  03/22/19 125.6 kg  12/31/18 123.4 kg     Intake/Output Summary (Last 24 hours) at 05/24/2019 1423 Last data filed at 05/23/2019 1530 Gross per 24 hour  Intake 320 ml  Output -  Net 320 ml     Physical Exam Gen Exam:Alert awake-not in any distress HEENT:atraumatic, normocephalic Chest: B/L clear to auscultation anteriorly CVS:S1S2 regular Abdomen:soft non tender, non distended Extremities:no edema Neurology: Non focal Skin: no rash   Data Review:    CBC Recent Labs  Lab 05/22/19 1820 05/23/19 0750 05/24/19 0054  WBC 5.9 4.0 7.7  HGB 13.4 12.7 13.3  HCT 41.9 40.2 41.8  PLT 142* 141* 182  MCV 91.3 90.5 90.5  MCH 29.2 28.6 28.8  MCHC 32.0 31.6 31.8  RDW 13.0 12.9 12.8  LYMPHSABS 1.4  --   --   MONOABS 0.3  --   --   EOSABS 0.0  --   --   BASOSABS 0.0  --   --     Chemistries  Recent Labs  Lab 05/22/19 1820 05/23/19 0750 05/24/19 0054  NA 136 137 138  K 3.9 4.0 3.6  CL 105 107 105  CO2 22 18* 21*  GLUCOSE 115* 154* 146*  BUN CREATININE 0.73 0.67 0.72   CALCIUM 8.5* 8.2* 8.4*  AST 74* 53* 67*  ALT 61* 53* 69*  ALKPHOS 78 74 82  BILITOT 0.3 0.2* 0.4   ------------------------------------------------------------------------------------------------------------------ Recent Labs    05/22/19 1820  TRIG 361*    Lab Results  Component Value Date   HGBA1C 6.3 (H) 05/23/2019   ------------------------------------------------------------------------------------------------------------------ No results for input(s): TSH, T4TOTAL, T3FREE, THYROIDAB in the last 72 hours.  Invalid input(s): FREET3 ------------------------------------------------------------------------------------------------------------------ Recent Labs    05/23/19 0750 05/24/19 0054  FERRITIN 306 431*    Coagulation profile No results for input(s): INR, PROTIME in the last 168 hours.  Recent Labs    05/23/19 0750 05/24/19 0054  DDIMER 0.38 0.33    Cardiac Enzymes No results for input(s): CKMB, TROPONINI, MYOGLOBIN in the last 168 hours.  Invalid input(s): CK ------------------------------------------------------------------------------------------------------------------ No results found for: BNP  Micro Results Recent Results (from the past 240 hour(s))  Novel Coronavirus, NAA ([facility])     Status: Abnormal   Collection Time: 05/17/19 12:00 AM   Specimen: Nasopharyngeal(NP) swabs in vial transport medium   NASOPHARYNGE  TESTING  Result Value Ref Range Status   SARS-CoV-2, NAA Detected (A) Not Detected Final    Comment: This nucleic acid amplification test was developed and its performance characteristics determined by [employer]. Nucleic acid amplification tests include PCR and TMA. This test has not been FDA cleared or approved. This test has been authorized by FDA under an Emergency Use Authorization (EUA). This test is only authorized for the duration of time the declaration that circumstances exist justifying the authorization of the  emergency use of in vitro diagnostic tests for detection of SARS-CoV-2 virus and/or diagnosis of COVID-19 infection under section 564(b)(1) of the Act, 21 U.S.C. 161WRU-0(A) (1), unless the authorization is terminated or revoked sooner. When diagnostic testing is negative, the possibility of a false negative result should be considered in the context of a patient's recent exposures and the presence of clinical signs and symptoms consistent with COVID-19. An individual without symptoms of COVID-19 and who is not shedding SARS-CoV-2 virus would  expect to have a negative (not detected) result in this assay.   Blood Culture (routine x 2)     Status: None (Preliminary result)   Collection Time: 05/22/19  6:20 PM   Specimen: BLOOD LEFT FOREARM  Result Value Ref Range Status   Specimen Description BLOOD LEFT FOREARM  Final   Special Requests   Final    BOTTLES DRAWN AEROBIC AND ANAEROBIC Blood Culture adequate volume   Culture   Final    NO GROWTH 2 DAYS Performed at [facility], [address] [place], [place] [address]    Report Status PENDING  Incomplete  Blood Culture (routine x 2)     Status: None (Preliminary result)   Collection Time:  05/22/19  6:20 PM   Specimen: BLOOD  Result Value Ref Range Status   Specimen Description BLOOD LEFT ANTECUBITAL  Final   Special Requests   Final    BOTTLES DRAWN AEROBIC AND ANAEROBIC Blood Culture adequate volume   Culture   Final    NO GROWTH 2 DAYS Performed at [facility], [address] [place], [place] [address]    Report Status PENDING  Incomplete  MRSA PCR Screening     Status: None   Collection Time: 05/23/19  7:15 AM   Specimen: Nasopharyngeal  Result Value Ref Range Status   MRSA by PCR NEGATIVE NEGATIVE Final    Comment:        The GeneXpert MRSA Assay (FDA approved for NASAL specimens only), is one component of a comprehensive MRSA colonization surveillance program. It is not intended to diagnose MRSA infection nor to  guide or monitor treatment for MRSA infections. Performed at [facility], [address] [place], [place] [address]     Radiology Reports Dg Chest Port 1 View  Result Date: 05/22/2019 CLINICAL DATA:  Shortness of breath EXAM: PORTABLE CHEST 1 VIEW COMPARISON:  08/17/2012 FINDINGS: Low lung volumes. Perihilar and left basilar interstitial and ground-glass opacity. Normal heart size. No pneumothorax IMPRESSION: Perihilar and left basilar ground-glass opacity, may reflect atypical/viral pneumonia. Electronically Signed   By: Jasmine Pang M.D.   On: 05/22/2019 19:38

## 2019-05-25 LAB — COMPREHENSIVE METABOLIC PANEL
ALT: 127 U/L — ABNORMAL HIGH (ref 0–44)
AST: 106 U/L — ABNORMAL HIGH (ref 15–41)
Albumin: 3.3 g/dL — ABNORMAL LOW (ref 3.5–5.0)
Alkaline Phosphatase: 77 U/L (ref 38–126)
Anion gap: 13 (ref 5–15)
BUN: 16 mg/dL (ref 6–20)
CO2: 23 mmol/L (ref 22–32)
Calcium: 8.6 mg/dL — ABNORMAL LOW (ref 8.9–10.3)
Chloride: 101 mmol/L (ref 98–111)
Creatinine, Ser: 0.8 mg/dL (ref 0.44–1.00)
GFR calc Af Amer: >60 mL/min (ref >60)
GFR calc non Af Amer: >60 mL/min (ref >60)
Glucose, Bld: 145 mg/dL — ABNORMAL HIGH (ref 70–99)
Potassium: 3.8 mmol/L (ref 3.5–5.1)
Sodium: 137 mmol/L (ref 135–145)
Total Bilirubin: 0.8 mg/dL (ref 0.3–1.2)
Total Protein: 6.6 g/dL (ref 6.5–8.1)

## 2019-05-25 LAB — GLUCOSE, CAPILLARY
Glucose-Capillary: 146 mg/dL — ABNORMAL HIGH (ref 70–99)
Glucose-Capillary: 182 mg/dL — ABNORMAL HIGH (ref 70–99)
Glucose-Capillary: 185 mg/dL — ABNORMAL HIGH (ref 70–99)

## 2019-05-25 LAB — CBC
HCT: 42.9 % (ref 36.0–46.0)
Hemoglobin: 13.8 g/dL (ref 12.0–15.0)
MCH: 29.1 pg (ref 26.0–34.0)
MCHC: 32.2 g/dL (ref 30.0–36.0)
MCV: 90.3 fL (ref 80.0–100.0)
Platelets: 223 10*3/uL (ref 150–400)
RBC: 4.75 MIL/uL (ref 3.87–5.11)
RDW: 12.7 % (ref 11.5–15.5)
WBC: 8.8 10*3/uL (ref 4.0–10.5)
nRBC: 0 % (ref 0.0–0.2)

## 2019-05-25 LAB — FERRITIN: Ferritin: 536 ng/mL — ABNORMAL HIGH (ref 11–307)

## 2019-05-25 LAB — D-DIMER, QUANTITATIVE: D-Dimer, Quant: 0.37 ug/mL-FEU (ref 0.00–0.50)

## 2019-05-25 LAB — C-REACTIVE PROTEIN: CRP: 1.7 mg/dL — ABNORMAL HIGH (ref <1.0)

## 2019-05-25 MED ORDER — FUROSEMIDE 10 MG/ML IJ SOLN
40.0000 mg | Freq: Once | INTRAMUSCULAR | Status: AC
  Administered 2019-05-25: 40 mg via INTRAVENOUS
  Filled 2019-05-25: qty 4

## 2019-05-25 NOTE — Plan of Care (Signed)
Spoke with husband to update on POC and patient status.  Also talked about earliest possibility for DC could be 11/23, but would re-assess on that day.  No further questions at this time.

## 2019-05-25 NOTE — Progress Notes (Signed)
PROGRESS NOTE                                                                                                                                                                                                             Patient Demographics:    Laura Combs, is a 38 y.o. female, DOB - 10-04-1980, INO:[id number]  Outpatient Primary MD for the patient is Bennie Pierini, FNP   Admit date - 05/22/2019   LOS - 3  Chief Complaint  Patient presents with  . Shortness of Breath       Brief Narrative: Patient is a 38 y.o. female with PMHx of hypertension, obesity-who was Covid positive on 11/13-presented to the hospital with fever, dyspnea and myalgias-found to have acute hypoxic respiratory failure secondary to COVID-19 pneumonia.  See below for further details.   Subjective:    Laura Combs today continues to slowly improve-afebrile-still with cough.  Requiring more oxygen at night-does not know if she has OSA.  Overall feels better.   Assessment  & Plan :   Acute Hypoxic Resp Failure due to Covid 19 Viral pneumonia: slowly improving-continue steroids and remdesivir.  Follow markers.  Fever: afebrile  O2 requirements:  SpO2: 91 % O2 Flow Rate (L/min): 2 L/min   COVID-19 Labs: Recent Labs    05/22/19 1820 05/23/19 0750 05/24/19 0054 05/25/19 0115  DDIMER 0.37 0.38 0.33 0.37  FERRITIN 362* 306 431* 536*  LDH 270*  --   --   --   CRP 6.5* 8.2* 5.7* 1.7*    Lab Results  Component Value Date   SARSCOV2NAA Detected (A) 05/17/2019     COVID-19 Medications: Steroids: 11/18>> Remdesivir: 11/18>> Actemra: Not given Convalescent Plasma: 11/19 x 1  Other medications: Diuretics:Euvolemic-repeat Lasix x1 today Antibiotics: Stop Zithromax on 11/19-no evidence of bacterial infection.  Prone/Incentive Spirometry: encouraged incentive spirometry use 3-4/hour.  DVT Prophylaxis  :  Lovenox  Transaminitis:  Secondary to COVID-19-follow-doubt need for any further work-up as should normalize with time.  HTN: Controlled-continue lisinopril  ?  OSA: Noted to have nocturnal hypoxia-patient aware that she needs a sleep study upon discharge.  Obesity: Estimated body mass index is 44.25 kg/m as calculated from the following:   Height as of this encounter: 5\' 3"  (1.6 m).   Weight as of this encounter: 113.3 kg.   Consults  :  None  Procedures  :  None  ABG: No results found for: PHART, PCO2ART, PO2ART, HCO3, TCO2, ACIDBASEDEF, O2SAT  Vent Settings: N/A  Condition - Stable  Family Communication  : Left a voicemail for spouse on 11/21  Code Status :  Full Code  Diet :  Diet Order            Diet Heart Room service appropriate? Yes; Fluid consistency: Thin  Diet effective now               Disposition Plan  :  Remain hospitalized  Barriers to discharge: Hypoxia requiring O2 supplementation/complete 5 days of IV Remdesivir  Antimicorbials  :    Anti-infectives (From admission, onward)   Start     Dose/Rate Route Frequency Ordered Stop   05/24/19 1000  remdesivir 100 mg in sodium chloride 0.9 % 250 mL IVPB     100 mg 500 mL/hr over 30 Minutes Intravenous Every 24 hours 05/23/19 0448 05/28/19 0959   05/23/19 0515  remdesivir 200 mg in sodium chloride 0.9 % 250 mL IVPB     200 mg 500 mL/hr over 30 Minutes Intravenous Once 05/23/19 0448 05/23/19 0634   05/22/19 2315  azithromycin (ZITHROMAX) 500 mg in sodium chloride 0.9 % 250 mL IVPB  Status:  Discontinued     500 mg 250 mL/hr over 60 Minutes Intravenous Every 24 hours 05/22/19 2311 05/23/19 0734      Inpatient Medications  Scheduled Meds: . benzonatate  200 mg Oral TID  . enoxaparin (LOVENOX) injection  60 mg Subcutaneous Q24H  . escitalopram  20 mg Oral Daily  . famotidine  20 mg Oral Daily  . insulin aspart  0-9 Units Subcutaneous TID WC  . lisinopril  20 mg Oral Daily  . methylPREDNISolone (SOLU-MEDROL) injection   60 mg Intravenous Q12H  . multivitamin with minerals  1 tablet Oral Daily  . sodium chloride flush  3 mL Intravenous Q12H   Continuous Infusions: . sodium chloride 250 mL (05/23/19 0600)  . remdesivir 100 mg in NS 250 mL 100 mg (05/25/19 0924)   PRN Meds:.sodium chloride, acetaminophen, albuterol, chlorpheniramine-HYDROcodone, clonazePAM, menthol-cetylpyridinium, ondansetron (ZOFRAN) IV, polyethylene glycol, sodium chloride flush   Time Spent in minutes  25  See all Orders from today for further details   Oren Binet M.D on 05/25/2019 at 12:23 PM  To page go to [email or web address] - use universal password  [facility] -  Office  [phone]    Objective:   Vitals:   05/24/19 1923 05/25/19 0426 05/25/19 0726 05/25/19 0914  BP: 126/67 107/60 106/67 112/70  Pulse: 87 69 68   Resp: 19 18 18    Temp: 98.4 F (36.9 C) 98.2 F (36.8 C) 98.7 F (37.1 C)   TempSrc: Oral Oral Oral   SpO2: 92% 92% 91%   Weight:      Height:        Wt Readings from Last 3 Encounters:  05/23/19 113.3 kg  03/22/19 125.6 kg  12/31/18 123.4 kg    No intake or output data in the 24 hours ending 05/25/19 1223   Physical Exam Gen Exam:Alert awake-not in any distress HEENT:atraumatic, normocephalic Chest: B/L clear to auscultation anteriorly CVS:S1S2 regular Abdomen:soft non tender, non distended Extremities:no edema Neurology: Non focal Skin: no rash   Data Review:    CBC Recent Labs  Lab 05/22/19 1820 05/23/19 0750 05/24/19 0054 05/25/19 0115  WBC 5.9 4.0 7.7 8.8  HGB 13.4 12.7 13.3 13.8  HCT 41.9 40.2  41.8 42.9  PLT 142* 141* 182 223  MCV 91.3 90.5 90.5 90.3  MCH 29.2 28.6 28.8 29.1  MCHC 32.0 31.6 31.8 32.2  RDW 13.0 12.9 12.8 12.7  LYMPHSABS 1.4  --   --   --   MONOABS 0.3  --   --   --   EOSABS 0.0  --   --   --   BASOSABS 0.0  --   --   --     Chemistries  Recent Labs  Lab 05/22/19 1820 05/23/19 0750 05/24/19 0054 05/25/19 0115  NA 136 137 138 137  K  3.9 4.0 3.6 3.8  CL 105 107 105 101  CO2 22 18* 21* 23  GLUCOSE 115* 154* 146* 145*  BUN 10 8 9 16   CREATININE 0.73 0.67 0.72 0.80  CALCIUM 8.5* 8.2* 8.4* 8.6*  AST 74* 53* 67* 106*  ALT 61* 53* 69* 127*  ALKPHOS 78 74 82 77  BILITOT 0.3 0.2* 0.4 0.8   ------------------------------------------------------------------------------------------------------------------ Recent Labs    05/22/19 1820  TRIG 361*    Lab Results  Component Value Date   HGBA1C 6.3 (H) 05/23/2019   ------------------------------------------------------------------------------------------------------------------ No results for input(s): TSH, T4TOTAL, T3FREE, THYROIDAB in the last 72 hours.  Invalid input(s): FREET3 ------------------------------------------------------------------------------------------------------------------ Recent Labs    05/24/19 0054 05/25/19 0115  FERRITIN 431* 536*    Coagulation profile No results for input(s): INR, PROTIME in the last 168 hours.  Recent Labs    05/24/19 0054 05/25/19 0115  DDIMER 0.33 0.37    Cardiac Enzymes No results for input(s): CKMB, TROPONINI, MYOGLOBIN in the last 168 hours.  Invalid input(s): CK ------------------------------------------------------------------------------------------------------------------ No results found for: BNP  Micro Results Recent Results (from the past 240 hour(s))  Novel Coronavirus, NAA ([facility])     Status: Abnormal   Collection Time: 05/17/19 12:00 AM   Specimen: Nasopharyngeal(NP) swabs in vial transport medium   NASOPHARYNGE  TESTING  Result Value Ref Range Status   SARS-CoV-2, NAA Detected (A) Not Detected Final    Comment: This nucleic acid amplification test was developed and its performance characteristics determined by [employer] [facility]. Nucleic acid amplification tests include PCR and TMA. This test has not been FDA cleared or approved. This test has been authorized by FDA under an Emergency  Use Authorization (EUA). This test is only authorized for the duration of time the declaration that circumstances exist justifying the authorization of the emergency use of in vitro diagnostic tests for detection of SARS-CoV-2 virus and/or diagnosis of COVID-19 infection under section 564(b)(1) of the Act, 21 U.S.C. 161WRU-0(A360bbb-3(b) (1), unless the authorization is terminated or revoked sooner. When diagnostic testing is negative, the possibility of a false negative result should be considered in the context of a patient's recent exposures and the presence of clinical signs and symptoms consistent with COVID-19. An individual without symptoms of COVID-19 and who is not shedding SARS-CoV-2 virus would  expect to have a negative (not detected) result in this assay.   Blood Culture (routine x 2)     Status: None (Preliminary result)   Collection Time: 05/22/19  6:20 PM   Specimen: BLOOD LEFT FOREARM  Result Value Ref Range Status   Specimen Description BLOOD LEFT FOREARM  Final   Special Requests   Final    BOTTLES DRAWN AEROBIC AND ANAEROBIC Blood Culture adequate volume   Culture   Final    NO GROWTH 3 DAYS Performed at [facility], [address] [place], [place] [id number]  Report Status PENDING  Incomplete  Blood Culture (routine x 2)     Status: None (Preliminary result)   Collection Time: 05/22/19  6:20 PM   Specimen: BLOOD  Result Value Ref Range Status   Specimen Description BLOOD LEFT ANTECUBITAL  Final   Special Requests   Final    BOTTLES DRAWN AEROBIC AND ANAEROBIC Blood Culture adequate volume   Culture   Final    NO GROWTH 3 DAYS Performed at [facility], [address], [place], [place] [address]    Report Status PENDING  Incomplete  MRSA PCR Screening     Status: None   Collection Time: 05/23/19  7:15 AM   Specimen: Nasopharyngeal  Result Value Ref Range Status   MRSA by PCR NEGATIVE NEGATIVE Final    Comment:        The GeneXpert MRSA Assay (FDA approved for  NASAL specimens only), is one component of a comprehensive MRSA colonization surveillance program. It is not intended to diagnose MRSA infection nor to guide or monitor treatment for MRSA infections. Performed at [facility], [address] [place], [place] [address]     Radiology Reports Dg Chest Port 1 View  Result Date: 05/22/2019 CLINICAL DATA:  Shortness of breath EXAM: PORTABLE CHEST 1 VIEW COMPARISON:  08/17/2012 FINDINGS: Low lung volumes. Perihilar and left basilar interstitial and ground-glass opacity. Normal heart size. No pneumothorax IMPRESSION: Perihilar and left basilar ground-glass opacity, may reflect atypical/viral pneumonia. Electronically Signed   By: Jasmine Pang M.D.   On: 05/22/2019 19:38

## 2019-05-26 LAB — COMPREHENSIVE METABOLIC PANEL
ALT: 180 U/L — ABNORMAL HIGH (ref 0–44)
AST: 103 U/L — ABNORMAL HIGH (ref 15–41)
Albumin: 3.3 g/dL — ABNORMAL LOW (ref 3.5–5.0)
Alkaline Phosphatase: 71 U/L (ref 38–126)
Anion gap: 13 (ref 5–15)
BUN: 20 mg/dL (ref 6–20)
CO2: 23 mmol/L (ref 22–32)
Calcium: 8.5 mg/dL — ABNORMAL LOW (ref 8.9–10.3)
Chloride: 101 mmol/L (ref 98–111)
Creatinine, Ser: 0.75 mg/dL (ref 0.44–1.00)
GFR calc Af Amer: >60 mL/min (ref >60)
GFR calc non Af Amer: >60 mL/min (ref >60)
Glucose, Bld: 157 mg/dL — ABNORMAL HIGH (ref 70–99)
Potassium: 3.8 mmol/L (ref 3.5–5.1)
Sodium: 137 mmol/L (ref 135–145)
Total Bilirubin: 0.6 mg/dL (ref 0.3–1.2)
Total Protein: 6.6 g/dL (ref 6.5–8.1)

## 2019-05-26 LAB — CBC
HCT: 43.7 % (ref 36.0–46.0)
Hemoglobin: 14.1 g/dL (ref 12.0–15.0)
MCH: 28.9 pg (ref 26.0–34.0)
MCHC: 32.3 g/dL (ref 30.0–36.0)
MCV: 89.5 fL (ref 80.0–100.0)
Platelets: 248 10*3/uL (ref 150–400)
RBC: 4.88 MIL/uL (ref 3.87–5.11)
RDW: 12.6 % (ref 11.5–15.5)
WBC: 10.1 10*3/uL (ref 4.0–10.5)
nRBC: 0 % (ref 0.0–0.2)

## 2019-05-26 LAB — GLUCOSE, CAPILLARY
Glucose-Capillary: 141 mg/dL — ABNORMAL HIGH (ref 70–99)
Glucose-Capillary: 170 mg/dL — ABNORMAL HIGH (ref 70–99)
Glucose-Capillary: 129 mg/dL — ABNORMAL HIGH (ref 70–99)
Glucose-Capillary: 157 mg/dL — ABNORMAL HIGH (ref 70–99)

## 2019-05-26 LAB — D-DIMER, QUANTITATIVE: D-Dimer, Quant: <0.27 ug/mL-FEU (ref 0.00–0.50)

## 2019-05-26 LAB — C-REACTIVE PROTEIN: CRP: 1.1 mg/dL — ABNORMAL HIGH (ref <1.0)

## 2019-05-26 LAB — FERRITIN: Ferritin: 505 ng/mL — ABNORMAL HIGH (ref 11–307)

## 2019-05-26 MED ORDER — METHYLPREDNISOLONE SODIUM SUCC 40 MG IJ SOLR
40.0000 mg | Freq: Every day | INTRAMUSCULAR | Status: DC
  Administered 2019-05-26 – 2019-05-27 (×2): 40 mg via INTRAVENOUS
  Filled 2019-05-26 (×3): qty 1

## 2019-05-26 NOTE — Plan of Care (Addendum)
Spoke with Husband to update on POC and patient status. Told current on RA at rest. Also informed of patient's success of walking in the hall - but needing 6LNC to do so safely. Made aware that patientmay need O2 at discharge.  No further questions at this time.

## 2019-05-26 NOTE — Progress Notes (Signed)
PROGRESS NOTE                                                                                                                                                                                                             Patient Demographics:    Laura Combs, is a 38 y.o. female, DOB - 1980/12/04, WUJ:[id number]  Outpatient Primary MD for the patient is Chevis Pretty, FNP   Admit date - 05/22/2019   LOS - 4  Chief Complaint  Patient presents with  . Shortness of Breath       Brief Narrative: Patient is a 38 y.o. female with PMHx of hypertension, obesity-who was Covid positive on 11/13-presented to the hospital with fever, dyspnea and myalgias-found to have acute hypoxic respiratory failure secondary to COVID-19 pneumonia.  See below for further details.   Subjective:   Patient in bed, appears comfortable, denies any headache, no fever, no chest pain or pressure, no shortness of breath , no abdominal pain. No focal weakness.    Assessment  & Plan :   Acute Hypoxic Resp Failure due to Covid 19 Viral pneumonia: much better on C.Plasma, Steroids and Remdesivir on 2 lits now.  Advance activity, taper o2 and steroids, likely has OSA and OHS so may need 1-2 lits at night.   COVID-19 Medications: Steroids: 11/18>> Remdesivir: 11/18>> Actemra: Not given Convalescent Plasma: 11/19 x 1  O2 requirements:  SpO2: 92 % O2 Flow Rate (L/min): 3 L/min   COVID-19 Labs: Recent Labs    05/24/19 0054 05/25/19 0115 05/26/19 0225  DDIMER 0.33 0.37 <0.27  FERRITIN 431* 536* 505*  CRP 5.7* 1.7* 1.1*    Lab Results  Component Value Date   SARSCOV2NAA Detected (A) 05/17/2019      Hepatic Function Latest Ref Rng & Units 05/26/2019 05/25/2019 05/24/2019  Total Protein 6.5 - 8.1 g/dL 6.6 6.6 6.8  Albumin 3.5 - 5.0 g/dL 3.3(L) 3.3(L) 3.1(L)  AST 15 - 41 U/L 103(H) 106(H) 67(H)  ALT 0 - 44 U/L 180(H) 127(H) 69(H)   Alk Phosphatase 38 - 126 U/L 71 77 82  Total Bilirubin 0.3 - 1.2 mg/dL 0.6 0.8 0.4  Bilirubin, Direct 0.0 - 0.3 mg/dL - - -     Transaminitis: Secondary to COVID-19 & Remdesivir  - follow trend, asymptomatic.  HTN: Controlled-continue lisinopril  ?  OSA: Noted  to have nocturnal hypoxia-patient aware that she needs a sleep study upon discharge.  Obesity: BMI 44, follow with PCP     Consults  :  None  Condition - Stable  Family Communication  : Left a voicemail for spouse on 11/21  Code Status :  Full Code  Diet :  Diet Order            Diet Heart Room service appropriate? Yes; Fluid consistency: Thin  Diet effective now               Disposition Plan  :  Remain hospitalized  Barriers to discharge: Hypoxia requiring O2 supplementation/complete 5 days of IV Remdesivir  Antimicorbials  :    Anti-infectives (From admission, onward)   Start     Dose/Rate Route Frequency Ordered Stop   05/24/19 1000  remdesivir 100 mg in sodium chloride 0.9 % 250 mL IVPB     100 mg 500 mL/hr over 30 Minutes Intravenous Every 24 hours 05/23/19 0448 05/28/19 0959   05/23/19 0515  remdesivir 200 mg in sodium chloride 0.9 % 250 mL IVPB     200 mg 500 mL/hr over 30 Minutes Intravenous Once 05/23/19 0448 05/23/19 0634   05/22/19 2315  azithromycin (ZITHROMAX) 500 mg in sodium chloride 0.9 % 250 mL IVPB  Status:  Discontinued     500 mg 250 mL/hr over 60 Minutes Intravenous Every 24 hours 05/22/19 2311 05/23/19 0734     DVT Prophylaxis  :  Lovenox  Inpatient Medications  Scheduled Meds: . benzonatate  200 mg Oral TID  . enoxaparin (LOVENOX) injection  60 mg Subcutaneous Q24H  . escitalopram  20 mg Oral Daily  . famotidine  20 mg Oral Daily  . insulin aspart  0-9 Units Subcutaneous TID WC  . lisinopril  20 mg Oral Daily  . methylPREDNISolone (SOLU-MEDROL) injection  60 mg Intravenous Q12H  . multivitamin with minerals  1 tablet Oral Daily  . sodium chloride flush  3 mL Intravenous  Q12H   Continuous Infusions: . sodium chloride 250 mL (05/23/19 0600)  . remdesivir 100 mg in NS 250 mL 100 mg (05/25/19 0924)   PRN Meds:.sodium chloride, acetaminophen, albuterol, chlorpheniramine-HYDROcodone, clonazePAM, menthol-cetylpyridinium, ondansetron (ZOFRAN) IV, polyethylene glycol, sodium chloride flush   Time Spent in minutes  25  See all Orders from today for further details   Lala Lund M.D on 05/26/2019 at 9:21 AM  To page go to [email or web address] - use universal password  [facility] -  Office  [phone]    Objective:   Vitals:   05/25/19 0726 05/25/19 0914 05/25/19 2000 05/26/19 0424  BP: 106/67 112/70 132/69 128/78  Pulse: 68  88 71  Resp: _0 Temp: 98.7 F (37.1 C)  98.6 F (37 C) 97.9 F (36.6 C)  TempSrc: Oral  Oral Oral  SpO2: 91%  93% 92%  Weight:      Height:        Wt Readings from Last 3 Encounters:  05/23/19 113.3 kg  03/22/19 125.6 kg  12/31/18 123.4 kg     Intake/Output Summary (Last 24 hours) at 05/26/2019 0921 Last data filed at 05/25/2019 1900 Gross per 24 hour  Intake 500 ml  Output 700 ml  Net -200 ml     Physical Exam  Awake Alert, Oriented X 3, No new F.N deficits, Normal affect [place].AT,PERRAL Supple Neck,No JVD, No cervical lymphadenopathy appriciated.  Symmetrical Chest wall movement, Good air movement bilaterally, CTAB RRR,No Gallops, Rubs  or new Murmurs, No Parasternal Heave +ve B.Sounds, Abd Soft, No tenderness, No organomegaly appriciated, No rebound - guarding or rigidity. No Cyanosis, Clubbing or edema, No new Rash or bruise    Data Review:    CBC Recent Labs  Lab 05/22/19 1820 05/23/19 0750 05/24/19 0054 05/25/19 0115 05/26/19 0225  WBC 5.9 4.0 7.7 8.8 10.1  HGB 13.4 12.7 13.3 13.8 14.1  HCT 41.9 40.2 41.8 42.9 43.7  PLT 142* 141* 182 223 248  MCV 91.3 90.5 90.5 90.3 89.5  MCH 29.2 28.6 28.8 29.1 28.9  MCHC 32.0 31.6 31.8 32.2 32.3  RDW 13.0 12.9 12.8 12.7 12.6  LYMPHSABS 1.4   --   --   --   --   MONOABS 0.3  --   --   --   --   EOSABS 0.0  --   --   --   --   BASOSABS 0.0  --   --   --   --     Chemistries  Recent Labs  Lab 05/22/19 1820 05/23/19 0750 05/24/19 0054 05/25/19 0115 05/26/19 0225  NA 136 137 138 137 137  K 3.9 4.0 3.6 3.8 3.8  CL 105 107 105 101 101  CO2 22 18* 21* 23 23  GLUCOSE 115* 154* 146* 145* 157*  BUN _0 CREATININE 0.73 0.67 0.72 0.80 0.75  CALCIUM 8.5* 8.2* 8.4* 8.6* 8.5*  AST 74* 53* 67* 106* 103*  ALT 61* 53* 69* 127* 180*  ALKPHOS 78 74 82 77 71  BILITOT 0.3 0.2* 0.4 0.8 0.6   ------------------------------------------------------------------------------------------------------------------ No results for input(s): CHOL, HDL, LDLCALC, TRIG, CHOLHDL, LDLDIRECT in the last 72 hours.  Lab Results  Component Value Date   HGBA1C 6.3 (H) 05/23/2019   ------------------------------------------------------------------------------------------------------------------ No results for input(s): TSH, T4TOTAL, T3FREE, THYROIDAB in the last 72 hours.  Invalid input(s): FREET3 ------------------------------------------------------------------------------------------------------------------ Recent Labs    05/25/19 0115 05/26/19 0225  FERRITIN 536* 505*    Coagulation profile No results for input(s): INR, PROTIME in the last 168 hours.  Recent Labs    05/25/19 0115 05/26/19 0225  DDIMER 0.37 <0.27    Cardiac Enzymes No results for input(s): CKMB, TROPONINI, MYOGLOBIN in the last 168 hours.  Invalid input(s): CK ------------------------------------------------------------------------------------------------------------------ No results found for: BNP  Micro Results Recent Results (from the past 240 hour(s))  Novel Coronavirus, NAA ([facility])     Status: Abnormal   Collection Time: 05/17/19 12:00 AM   Specimen: Nasopharyngeal(NP) swabs in vial transport medium   NASOPHARYNGE  TESTING  Result Value Ref  Range Status   SARS-CoV-2, NAA Detected (A) Not Detected Final    Comment: This nucleic acid amplification test was developed and its performance characteristics determined by [employer]. Nucleic acid amplification tests include PCR and TMA. This test has not been FDA cleared or approved. This test has been authorized by FDA under an Emergency Use Authorization (EUA). This test is only authorized for the duration of time the declaration that circumstances exist justifying the authorization of the emergency use of in vitro diagnostic tests for detection of SARS-CoV-2 virus and/or diagnosis of COVID-19 infection under section 564(b)(1) of the Act, 21 U.S.C. 956OZH-0(Q) (1), unless the authorization is terminated or revoked sooner. When diagnostic testing is negative, the possibility of a false negative result should be considered in the context of a patient's recent exposures and the presence of clinical signs and symptoms consistent with COVID-19. An individual without symptoms of COVID-19 and who  is not shedding SARS-CoV-2 virus would  expect to have a negative (not detected) result in this assay.   Blood Culture (routine x 2)     Status: None (Preliminary result)   Collection Time: 05/22/19  6:20 PM   Specimen: BLOOD LEFT FOREARM  Result Value Ref Range Status   Specimen Description BLOOD LEFT FOREARM  Final   Special Requests   Final    BOTTLES DRAWN AEROBIC AND ANAEROBIC Blood Culture adequate volume   Culture   Final    NO GROWTH 3 DAYS Performed at [facility], [address], [place], [place] [address]    Report Status PENDING  Incomplete  Blood Culture (routine x 2)     Status: None (Preliminary result)   Collection Time: 05/22/19  6:20 PM   Specimen: BLOOD  Result Value Ref Range Status   Specimen Description BLOOD LEFT ANTECUBITAL  Final   Special Requests   Final    BOTTLES DRAWN AEROBIC AND ANAEROBIC Blood Culture adequate volume   Culture   Final    NO  GROWTH 3 DAYS Performed at [facility], [address] [place], [place] [address]    Report Status PENDING  Incomplete  MRSA PCR Screening     Status: None   Collection Time: 05/23/19  7:15 AM   Specimen: Nasopharyngeal  Result Value Ref Range Status   MRSA by PCR NEGATIVE NEGATIVE Final    Comment:        The GeneXpert MRSA Assay (FDA approved for NASAL specimens only), is one component of a comprehensive MRSA colonization surveillance program. It is not intended to diagnose MRSA infection nor to guide or monitor treatment for MRSA infections. Performed at [facility], [place] [address] [place], [place] [address]     Radiology Reports Dg Chest Port 1 View  Result Date: 05/22/2019 CLINICAL DATA:  Shortness of breath EXAM: PORTABLE CHEST 1 VIEW COMPARISON:  08/17/2012 FINDINGS: Low lung volumes. Perihilar and left basilar interstitial and ground-glass opacity. Normal heart size. No pneumothorax IMPRESSION: Perihilar and left basilar ground-glass opacity, may reflect atypical/viral pneumonia. Electronically Signed   By: Donavan Foil M.D.   On: 05/22/2019 19:38

## 2019-05-26 NOTE — Plan of Care (Signed)
At rest  - patient maintains 88-92% SPO2 on RA Ambulating in hall SPO2 fell to 83% on RA Patient needed [facility] to ambulate in hall and maintain 88-92% Now, if discharged home, patient will need O2 for activity.

## 2019-05-27 LAB — GLUCOSE, CAPILLARY: Glucose-Capillary: 87 mg/dL (ref 70–99)

## 2019-05-27 LAB — CBC
HCT: 44.2 % (ref 36.0–46.0)
Hemoglobin: 14.2 g/dL (ref 12.0–15.0)
MCH: 28.7 pg (ref 26.0–34.0)
MCHC: 32.1 g/dL (ref 30.0–36.0)
MCV: 89.3 fL (ref 80.0–100.0)
Platelets: 250 10*3/uL (ref 150–400)
RBC: 4.95 MIL/uL (ref 3.87–5.11)
RDW: 12.5 % (ref 11.5–15.5)
WBC: 11.3 10*3/uL — ABNORMAL HIGH (ref 4.0–10.5)
nRBC: 0 % (ref 0.0–0.2)

## 2019-05-27 LAB — COMPREHENSIVE METABOLIC PANEL
ALT: 161 U/L — ABNORMAL HIGH (ref 0–44)
AST: 62 U/L — ABNORMAL HIGH (ref 15–41)
Albumin: 3 g/dL — ABNORMAL LOW (ref 3.5–5.0)
Alkaline Phosphatase: 60 U/L (ref 38–126)
Anion gap: 11 (ref 5–15)
BUN: 21 mg/dL — ABNORMAL HIGH (ref 6–20)
CO2: 23 mmol/L (ref 22–32)
Calcium: 8.4 mg/dL — ABNORMAL LOW (ref 8.9–10.3)
Chloride: 105 mmol/L (ref 98–111)
Creatinine, Ser: 0.7 mg/dL (ref 0.44–1.00)
GFR calc Af Amer: >60 mL/min (ref >60)
GFR calc non Af Amer: >60 mL/min (ref >60)
Glucose, Bld: 115 mg/dL — ABNORMAL HIGH (ref 70–99)
Potassium: 4 mmol/L (ref 3.5–5.1)
Sodium: 139 mmol/L (ref 135–145)
Total Bilirubin: 0.5 mg/dL (ref 0.3–1.2)
Total Protein: 6.1 g/dL — ABNORMAL LOW (ref 6.5–8.1)

## 2019-05-27 LAB — CULTURE, BLOOD (ROUTINE X 2)
Culture: NO GROWTH
Culture: NO GROWTH
Special Requests: ADEQUATE
Special Requests: ADEQUATE

## 2019-05-27 LAB — C-REACTIVE PROTEIN: CRP: 1.1 mg/dL — ABNORMAL HIGH (ref <1.0)

## 2019-05-27 LAB — D-DIMER, QUANTITATIVE: D-Dimer, Quant: <0.27 ug/mL-FEU (ref 0.00–0.50)

## 2019-05-27 LAB — FERRITIN: Ferritin: 318 ng/mL — ABNORMAL HIGH (ref 11–307)

## 2019-05-27 MED ORDER — ALBUTEROL SULFATE HFA 108 (90 BASE) MCG/ACT IN AERS: 2.0000 | INHALATION_SPRAY | Freq: Four times a day (QID) | RESPIRATORY_TRACT | 0 refills | Status: DC | PRN

## 2019-05-27 MED ORDER — METHYLPREDNISOLONE 4 MG PO TBPK: ORAL_TABLET | ORAL | 0 refills | Status: DC

## 2019-05-27 NOTE — Discharge Instructions (Signed)
Follow with Primary MD Daphine DeutscherMartin, Mary-Margaret, FNP in 7 days.  Schedule an outpatient sleep study through your PCP  Get CBC, CMP, 2 view Chest X ray -  checked next visit within 1 week by Primary MD   Activity: As tolerated with Full fall precautions use walker/cane & assistance as needed  Disposition Home   Diet: Heart Healthy   Special Instructions: If you have smoked or chewed Tobacco  in the last 2 yrs please stop smoking, stop any regular Alcohol  and or any Recreational drug use.  On your next visit with your primary care physician please Get Medicines reviewed and adjusted.  Please request your Prim.MD to go over all Hospital Tests and Procedure/Radiological results at the follow up, please get all Hospital records sent to your Prim MD by signing hospital release before you go home.  If you experience worsening of your admission symptoms, develop shortness of breath, life threatening emergency, suicidal or homicidal thoughts you must seek medical attention immediately by calling 911 or calling your MD immediately  if symptoms less severe.  You Must read complete instructions/literature along with all the possible adverse reactions/side effects for all the Medicines you take and that have been prescribed to you. Take any new Medicines after you have completely understood and accpet all the possible adverse reactions/side effects.                                                                           [facility]                            [address]                            [place], [place] [address]      Laura Combs was admitted to the Hospital on 05/22/2019 and Discharged  05/27/2019 and should be excused from work/school   for 21 days starting from date -  05/22/2019 , may return to work/school without any restrictions.  Call Susa RaringPrashant Dennis Killilea MD, [facility]  [phone] with questions.  Susa RaringPrashant Aeryn Medici M.D on 05/27/2019,at 9:28  AM  [facility]   Office  [phone]      Person Under Monitoring Name: Laura Combs  Location: [address] [place] [address]   Infection Prevention Recommendations for Individuals Confirmed to have, or Being Evaluated for, 2019 Novel Coronavirus (COVID-19) Infection Who Receive Care at Home  Individuals who are confirmed to have, or are being evaluated for, COVID-19 should follow the prevention steps below until a healthcare provider or local or state health department says they can return to normal activities.  Stay home except to get medical care You should restrict activities outside your home, except for getting medical care. Do not go to work, school, or public areas, and do not use public transportation or taxis.  Call ahead before visiting your doctor Before your medical appointment, call the healthcare provider and tell them that you have, or are being evaluated for, COVID-19 infection. This will help the healthcare providers office take steps to keep other people from getting infected.  Ask your healthcare provider to call the local or state health department.  Monitor your symptoms Seek prompt medical attention if your illness is worsening (e.g., difficulty breathing). Before going to your medical appointment, call the healthcare provider and tell them that you have, or are being evaluated for, COVID-19 infection. Ask your healthcare provider to call the local or state health department.  Wear a facemask You should wear a facemask that covers your nose and mouth when you are in the same room with other people and when you visit a healthcare provider. People who live with or visit you should also wear a facemask while they are in the same room with you.  Separate yourself from other people in your home As much as possible, you should stay in a different room from other people in your home. Also, you should use a separate bathroom, if  available.  Avoid sharing household items You should not share dishes, drinking glasses, cups, eating utensils, towels, bedding, or other items with other people in your home. After using these items, you should wash them thoroughly with soap and water.  Cover your coughs and sneezes Cover your mouth and nose with a tissue when you cough or sneeze, or you can cough or sneeze into your sleeve. Throw used tissues in a lined trash can, and immediately wash your hands with soap and water for at least 20 seconds or use an alcohol-based hand rub.  Wash your [facility] your hands often and thoroughly with soap and water for at least 20 seconds. You can use an alcohol-based hand sanitizer if soap and water are not available and if your hands are not visibly dirty. Avoid touching your eyes, nose, and mouth with unwashed hands.   Prevention Steps for Caregivers and Household Members of Individuals Confirmed to have, or Being Evaluated for, COVID-19 Infection Being Cared for in the Home  If you live with, or provide care at home for, a person confirmed to have, or being evaluated for, COVID-19 infection please follow these guidelines to prevent infection:  Follow healthcare providers instructions Make sure that you understand and can help the patient follow any healthcare provider instructions for all care.  Provide for the patients basic needs You should help the patient with basic needs in the home and provide support for getting groceries, prescriptions, and other personal needs.  Monitor the patients symptoms If they are getting sicker, call his or her medical provider and tell them that the patient has, or is being evaluated for, COVID-19 infection. This will help the healthcare providers office take steps to keep other people from getting infected. Ask the healthcare provider to call the local or state health department.  Limit the number of people who have contact with the  patient  If possible, have only one caregiver for the patient.  Other household members should stay in another home or place of residence. If this is not possible, they should stay  in another room, or be separated from the patient as much as possible. Use a separate bathroom, if available.  Restrict visitors who do not have an essential need to be in the home.  Keep older adults, very young children, and other sick people away from the patient Keep older adults, very young children, and those who have compromised immune systems or chronic health conditions away from the patient. This includes people with chronic heart, lung, or kidney conditions, diabetes, and cancer.  Ensure good ventilation Make sure that shared spaces  in the home have good air flow, such as from an air conditioner or an opened window, weather permitting.  Wash your hands often  Wash your hands often and thoroughly with soap and water for at least 20 seconds. You can use an alcohol based hand sanitizer if soap and water are not available and if your hands are not visibly dirty.  Avoid touching your eyes, nose, and mouth with unwashed hands.  Use disposable paper towels to dry your hands. If not available, use dedicated cloth towels and replace them when they become wet.  Wear a facemask and gloves  Wear a disposable facemask at all times in the room and gloves when you touch or have contact with the patients blood, body fluids, and/or secretions or excretions, such as sweat, saliva, sputum, nasal mucus, vomit, urine, or feces.  Ensure the mask fits over your nose and mouth tightly, and do not touch it during use.  Throw out disposable facemasks and gloves after using them. Do not reuse.  Wash your hands immediately after removing your facemask and gloves.  If your personal clothing becomes contaminated, carefully remove clothing and launder. Wash your hands after handling contaminated clothing.  Place all used  disposable facemasks, gloves, and other waste in a lined container before disposing them with other household waste.  Remove gloves and wash your hands immediately after handling these items.  Do not share dishes, glasses, or other household items with the patient  Avoid sharing household items. You should not share dishes, drinking glasses, cups, eating utensils, towels, bedding, or other items with a patient who is confirmed to have, or being evaluated for, COVID-19 infection.  After the person uses these items, you should wash them thoroughly with soap and water.  Wash laundry thoroughly  Immediately remove and wash clothes or bedding that have blood, body fluids, and/or secretions or excretions, such as sweat, saliva, sputum, nasal mucus, vomit, urine, or feces, on them.  Wear gloves when handling laundry from the patient.  Read and follow directions on labels of laundry or clothing items and detergent. In general, wash and dry with the warmest temperatures recommended on the label.  Clean all areas the individual has used often  Clean all touchable surfaces, such as counters, tabletops, doorknobs, bathroom fixtures, toilets, phones, keyboards, tablets, and bedside tables, every day. Also, clean any surfaces that may have blood, body fluids, and/or secretions or excretions on them.  Wear gloves when cleaning surfaces the patient has come in contact with.  Use a diluted bleach solution (e.g., dilute bleach with 1 part bleach and 10 parts water) or a household disinfectant with a label that says EPA-registered for coronaviruses. To make a bleach solution at home, add 1 tablespoon of bleach to 1 quart (4 cups) of water. For a larger supply, add  cup of bleach to 1 gallon (16 cups) of water.  Read labels of cleaning products and follow recommendations provided on product labels. Labels contain instructions for safe and effective use of the cleaning product including precautions you should  take when applying the product, such as wearing gloves or eye protection and making sure you have good ventilation during use of the product.  Remove gloves and wash hands immediately after cleaning.  Monitor yourself for signs and symptoms of illness Caregivers and household members are considered close contacts, should monitor their health, and will be asked to limit movement outside of the home to the extent possible. Follow the monitoring steps for close contacts  listed on the symptom monitoring form.   ? If you have additional questions, contact your local health department or call the epidemiologist on call at [phone] (available 24/7). ? This guidance is subject to change. For the most up-to-date guidance from [facility], please refer to their website: [email or web address]hu

## 2019-05-27 NOTE — Progress Notes (Signed)
Discharge instructions completed with pt.  Pt verbalized understanding of the information.  Pt denies chest pain, lightheadedness, n/v, dizziness, and lightheadedness. Pt's IV discontinued.  Pt discharged home.

## 2019-05-27 NOTE — Progress Notes (Signed)
Pt ambulated on room air.  Pt's o2 ranged from 88-90% during ambulation. Pt's o2 post ambulation 1 minute after sitting down was 91% on room air.

## 2019-05-27 NOTE — Discharge Summary (Signed)
Laura Combs IDP:[id number] DOB: 10/08/1980 DOA: 05/22/2019  PCP: Chevis Pretty, FNP  Admit date: 05/22/2019  Discharge date: 05/27/2019  Admitted From: Home  Disposition:  Home   Recommendations for Outpatient Follow-up:   Follow up with PCP in 1-2 weeks  PCP Please obtain BMP/CBC, 2 view CXR in 1week,  (see Discharge instructions)   PCP Please follow up on the following pending results: Needs outpatient sleep study.  Check CMP in 7 to 10 days.   Home Health: None   Equipment/Devices: None  Consultations: None  Discharge Condition: Stable    CODE STATUS: Full    Diet Recommendation: Heart Healthy      Chief Complaint  Patient presents with  . Shortness of Breath     Brief history of present illness from the day of admission and additional interim summary    Patient is a 38 y.o. female with PMHx of hypertension, obesity-who was Covid positive on 11/13-presented to the hospital with fever, dyspnea and myalgias-found to have acute hypoxic respiratory failure secondary to COVID-19 pneumonia.  See below for further details.                                                                 Hospital Course    Acute Hypoxic Resp Failure due to Covid 19 Viral pneumonia: much better on C.Plasma, Steroids and Remdesivir.  She is now symptom-free laying in hospital bed on room air pulse ox 92 when I examined her and in no respiratory distress.  She will be discharged home on oral steroid taper with rescue inhaler.  Follow with PCP in a week.  She likely has undiagnosed obstructive sleep apnea and will benefit from outpatient sleep study.  COVID-19 Labs  Recent Labs    05/25/19 0115 05/26/19 0225 05/27/19 0316  DDIMER 0.37 <0.27 <0.27  FERRITIN 536* 505* 318*  CRP 1.7* 1.1* 1.1*    Lab Results   Component Value Date   SARSCOV2NAA Detected (A) 05/17/2019    Hepatic Function Latest Ref Rng & Units 05/27/2019 05/26/2019 05/25/2019  Total Protein 6.5 - 8.1 g/dL 6.1(L) 6.6 6.6  Albumin 3.5 - 5.0 g/dL 3.0(L) 3.3(L) 3.3(L)  AST 15 - 41 U/L 62(H) 103(H) 106(H)  ALT 0 - 44 U/L 161(H) 180(H) 127(H)  Alk Phosphatase 38 - 126 U/L 60 71 77  Total Bilirubin 0.3 - 1.2 mg/dL 0.5 0.6 0.8  Bilirubin, Direct 0.0 - 0.3 mg/dL - - -     Transaminitis: Secondary to COVID-19 & Remdesivir  -asymptomatic, PCP to check CMP in 7 to 10 days.  HTN: Controlled-continue lisinopril  ?  OSA: Noted to have nocturnal hypoxia-patient aware that she needs a sleep study upon discharge.  Obesity: BMI 44, follow with PCP  Discharge diagnosis     Principal Problem:   COVID-19  virus infection Active Problems:   Acute respiratory failure due to COVID-19 ([place])   Abnormal liver function    Discharge instructions    Discharge Instructions    Diet - low sodium heart healthy   Complete by: As directed    Discharge instructions   Complete by: As directed    Follow with Primary MD Chevis Pretty, FNP in 7 days.  Schedule an outpatient sleep study through your PCP  Get CBC, CMP, 2 view Chest X ray -  checked next visit within 1 week by Primary MD   Activity: As tolerated with Full fall precautions use walker/cane & assistance as needed  Disposition Home   Diet: Heart Healthy   Special Instructions: If you have smoked or chewed Tobacco  in the last 2 yrs please stop smoking, stop any regular Alcohol  and or any Recreational drug use.  On your next visit with your primary care physician please Get Medicines reviewed and adjusted.  Please request your Prim.MD to go over all Hospital Tests and Procedure/Radiological results at the follow up, please get all Hospital records sent to your Prim MD by signing hospital release before you go home.  If you experience worsening of your admission  symptoms, develop shortness of breath, life threatening emergency, suicidal or homicidal thoughts you must seek medical attention immediately by calling 911 or calling your MD immediately  if symptoms less severe.  You Must read complete instructions/literature along with all the possible adverse reactions/side effects for all the Medicines you take and that have been prescribed to you. Take any new Medicines after you have completely understood and accpet all the possible adverse reactions/side effects.                                                                           Laura Combs                            [place], [place] [address]      Laura Combs was admitted to the Hospital on 05/22/2019 and Discharged  05/27/2019 and should be excused from work/school   for 21 days starting from date -  05/22/2019 , may return to work/school without any restrictions.  Call Laura Lund MD, [facility]  [phone] with questions.  Laura Combs M.D on 05/27/2019,at 9:28 AM  [facility]   Office  [phone]   Increase activity slowly   Complete by: As directed    MyChart COVID-19 home monitoring program   Complete by: May 27, 2019    Is the patient willing to use the [place] for home monitoring?: Yes   Temperature monitoring   Complete by: May 27, 2019    After how many days would you like to receive a notification of this patient's flowsheet entries?: 1      Discharge Medications   Allergies as of 05/27/2019   No Known Allergies     Medication List    STOP taking these medications  azithromycin 250 MG tablet Commonly known as: Zithromax Z-Pak   naproxen 500 MG tablet Commonly known as: Naprosyn   predniSONE 20 MG tablet Commonly known as: Deltasone     TAKE these medications   albuterol 108 (90 Base) MCG/ACT inhaler Commonly known as: VENTOLIN HFA Inhale 2 puffs into  the lungs every 6 (six) hours as needed for wheezing or shortness of breath.   eletriptan 40 MG tablet Commonly known as: RELPAX TAKE 1 TABLET AT ONSET OF HEADACHE, MAY REPEAT ONCE IN 2 HOURS What changed: See the new instructions.   Erenumab-aooe 70 MG/ML Soaj Commonly known as: Aimovig Inject 70 mg into the skin every 30 (thirty) days.   escitalopram 20 MG tablet Commonly known as: Lexapro Take 1 tablet (20 mg total) by mouth daily.   etonogestrel-ethinyl estradiol 0.12-0.015 MG/24HR vaginal ring Commonly known as: [place] 1 each vaginally every 28 (twenty-eight) days. Insert vaginally and leave in place for 3 consecutive weeks, then remove for 1 week.   lisinopril 20 MG tablet Commonly known as: ZESTRIL Take 1 tablet (20 mg total) by mouth daily. (Needs to be seen before next refill)   methylPREDNISolone 4 MG Tbpk tablet Commonly known as: MEDROL DOSEPAK follow package directions   multivitamin tablet Take 1 tablet by mouth daily.       Follow-up Information    Chevis Pretty, FNP. Schedule an appointment as soon as possible for a visit in 1 week(s).   Specialty: Family Medicine Contact information: [address] [place] [address] [phone]           Major procedures and Radiology Reports - PLEASE review detailed and final reports thoroughly  -         Dg Chest Port 1 View  Result Date: 05/22/2019 CLINICAL DATA:  Shortness of breath EXAM: PORTABLE CHEST 1 VIEW COMPARISON:  08/17/2012 FINDINGS: Low lung volumes. Perihilar and left basilar interstitial and ground-glass opacity. Normal heart size. No pneumothorax IMPRESSION: Perihilar and left basilar ground-glass opacity, may reflect atypical/viral pneumonia. Electronically Signed   By: Donavan Foil M.D.   On: 05/22/2019 19:38    Micro Results     Recent Results (from the past 240 hour(s))  Blood Culture (routine x 2)     Status: None   Collection Time: 05/22/19  6:20 PM    Specimen: BLOOD LEFT FOREARM  Result Value Ref Range Status   Specimen Description BLOOD LEFT FOREARM  Final   Special Requests   Final    BOTTLES DRAWN AEROBIC AND ANAEROBIC Blood Culture adequate volume   Culture   Final    NO GROWTH 5 DAYS Performed at [facility], [address], [place], [place] [address]    Report Status 05/27/2019 FINAL  Final  Blood Culture (routine x 2)     Status: None   Collection Time: 05/22/19  6:20 PM   Specimen: BLOOD  Result Value Ref Range Status   Specimen Description BLOOD LEFT ANTECUBITAL  Final   Special Requests   Final    BOTTLES DRAWN AEROBIC AND ANAEROBIC Blood Culture adequate volume   Culture   Final    NO GROWTH 5 DAYS Performed at [facility], [address], [place], [place] [address]    Report Status 05/27/2019 FINAL  Final  MRSA PCR Screening     Status: None   Collection Time: 05/23/19  7:15 AM   Specimen: Nasopharyngeal  Result Value Ref Range Status   MRSA by PCR NEGATIVE NEGATIVE Final  Comment:        The GeneXpert MRSA Assay (FDA approved for NASAL specimens only), is one component of a comprehensive MRSA colonization surveillance program. It is not intended to diagnose MRSA infection nor to guide or monitor treatment for MRSA infections. Performed at [facility], [place] [address], [place], [place] [address]     Today   Subjective    Alvetta Hidrogo today has no headache,no chest abdominal pain,no new weakness tingling or numbness, feels much better wants to go home today.     Objective   Blood pressure 121/75, pulse 68, temperature 98.1 F (36.7 C), temperature source Oral, resp. rate 19, height '5\' 3"'  (1.6 m), weight 113.3 kg, SpO2 93 %.   Intake/Output Summary (Last 24 hours) at 05/27/2019 0931 Last data filed at 05/27/2019 6767 Gross per 24 hour  Intake 283 ml  Output -  Net 283 ml    Exam Awake Alert, Oriented x 3, No new F.N deficits, Normal affect [place].AT,PERRAL Supple  Neck,No JVD, No cervical lymphadenopathy appriciated.  Symmetrical Chest wall movement, Good air movement bilaterally, CTAB RRR,No Gallops,Rubs or new Murmurs, No Parasternal Heave +ve B.Sounds, Abd Soft, Non tender, No organomegaly appriciated, No rebound -guarding or rigidity. No Cyanosis, Clubbing or edema, No new Rash or bruise   Data Review   CBC w Diff:  Lab Results  Component Value Date   WBC 11.3 (H) 05/27/2019   HGB 14.2 05/27/2019   HCT 44.2 05/27/2019   PLT 250 05/27/2019   LYMPHOPCT 24 05/22/2019   MONOPCT 5 05/22/2019   EOSPCT 0 05/22/2019   BASOPCT 0 05/22/2019    CMP:  Lab Results  Component Value Date   NA 139 05/27/2019   NA 142 07/25/2016   K 4.0 05/27/2019   CL 105 05/27/2019   CO2 23 05/27/2019   BUN 21 (H) 05/27/2019   BUN 7 07/25/2016   CREATININE 0.70 05/27/2019   PROT 6.1 (L) 05/27/2019   PROT 6.1 07/25/2016   ALBUMIN 3.0 (L) 05/27/2019   ALBUMIN 3.6 07/25/2016   BILITOT 0.5 05/27/2019   BILITOT <0.2 07/25/2016   ALKPHOS 60 05/27/2019   AST 62 (H) 05/27/2019   ALT 161 (H) 05/27/2019  .   Total Time in preparing paper work, data evaluation and todays exam - 47 minutes  Laura Combs M.D on 05/27/2019 at 9:31 AM  [facility]   Office  [phone]

## 2019-06-04 ENCOUNTER — Other Ambulatory Visit: Payer: Self-pay | Admitting: Nurse Practitioner

## 2019-06-04 DIAGNOSIS — R0683 Snoring: Secondary | ICD-10-CM

## 2019-06-05 ENCOUNTER — Ambulatory Visit (INDEPENDENT_AMBULATORY_CARE_PROVIDER_SITE_OTHER): Payer: BC Managed Care – PPO | Admitting: Nurse Practitioner

## 2019-06-05 ENCOUNTER — Encounter: Payer: Self-pay | Admitting: Nurse Practitioner

## 2019-06-05 DIAGNOSIS — Z09 Encounter for follow-up examination after completed treatment for conditions other than malignant neoplasm: Secondary | ICD-10-CM | POA: Diagnosis not present

## 2019-06-05 DIAGNOSIS — U071 COVID-19: Secondary | ICD-10-CM | POA: Diagnosis not present

## 2019-06-05 NOTE — Progress Notes (Signed)
   Virtual Visit via telephone Note Due to COVID-19 pandemic this visit was conducted virtually. This visit type was conducted due to national recommendations for restrictions regarding the COVID-19 Pandemic (e.g. social distancing, sheltering in place) in an effort to limit this patient's exposure and mitigate transmission in our community. All issues noted in this document were discussed and addressed.  A physical exam was not performed with this format.  I connected with Laura Combs on 06/05/19 at 11:20 by telephone and verified that I am speaking with the correct person using two identifiers. Laura Combs is currently located at home and no ne is currently with her during visit. The provider, Mary-Margaret Hassell Done, FNP is located in their office at time of visit.  I discussed the limitations, risks, security and privacy concerns of performing an evaluation and management service by telephone and the availability of in person appointments. I also discussed with the patient that there may be a patient responsible charge related to this service. The patient expressed understanding and agreed to proceed.   History and Present Illness:   Chief Complaint: Hospitalization Follow-up   HPI Patient was in the hospital with covid. She was discharged on 05/27/19. While in hospital she got remdesavir, ,antibiotis, steroid and oxygen. She did not need to be out on ventilator. She is dojng much better. She is still a little weak and gets tired easily.  She denies any SOB.   Review of Systems  Constitutional: Negative for diaphoresis and weight loss.  Eyes: Negative for blurred vision, double vision and pain.  Respiratory: Negative for shortness of breath.   Cardiovascular: Negative for chest pain, palpitations, orthopnea and leg swelling.  Gastrointestinal: Negative for abdominal pain.  Skin: Negative for rash.  Neurological: Negative for dizziness, sensory change, loss of consciousness,  weakness and headaches.  Endo/Heme/Allergies: Negative for polydipsia. Does not bruise/bleed easily.  Psychiatric/Behavioral: Negative for memory loss. The patient does not have insomnia.   All other systems reviewed and are negative.    Observations/Objective: .Alert and oriented- answers all questions appropriately No distress   Assessment and Plan: Vetta Basurto in today with chief complaint of Hospitalization Follow-up   1. Lab test positive for detection of COVID-19 virus  2. Hospital discharge follow-up [employer] fluids continue to quarantine until 06/12/19 as written out by doctor Discussed possible relapse of covid Hospital records reviewed  Follow Up Instructions: prn    I discussed the assessment and treatment plan with the patient. The patient was provided an opportunity to ask questions and all were answered. The patient agreed with the plan and demonstrated an understanding of the instructions.   The patient was advised to call back or seek an in-person evaluation if the symptoms worsen or if the condition fails to improve as anticipated.  The above assessment and management plan was discussed with the patient. The patient verbalized understanding of and has agreed to the management plan. Patient is aware to call the clinic if symptoms persist or worsen. Patient is aware when to return to the clinic for a follow-up visit. Patient educated on when it is appropriate to go to the emergency department.   Time call ended:  11:35 I provided 15 minutes of non-face-to-face time during this encounter.    Mary-Margaret Hassell Done, FNP

## 2019-07-04 ENCOUNTER — Other Ambulatory Visit: Payer: Self-pay | Admitting: Nurse Practitioner

## 2019-07-04 DIAGNOSIS — G43109 Migraine with aura, not intractable, without status migrainosus: Secondary | ICD-10-CM

## 2019-07-09 ENCOUNTER — Other Ambulatory Visit: Payer: Self-pay

## 2019-08-09 ENCOUNTER — Other Ambulatory Visit: Payer: Self-pay | Admitting: Nurse Practitioner

## 2019-08-09 ENCOUNTER — Other Ambulatory Visit: Payer: Self-pay

## 2019-08-09 ENCOUNTER — Ambulatory Visit: Payer: BC Managed Care – PPO | Attending: Nurse Practitioner | Admitting: Neurology

## 2019-08-09 DIAGNOSIS — G43109 Migraine with aura, not intractable, without status migrainosus: Secondary | ICD-10-CM

## 2019-08-09 DIAGNOSIS — Z79899 Other long term (current) drug therapy: Secondary | ICD-10-CM | POA: Diagnosis not present

## 2019-08-09 DIAGNOSIS — Z793 Long term (current) use of hormonal contraceptives: Secondary | ICD-10-CM | POA: Diagnosis not present

## 2019-08-09 DIAGNOSIS — G4733 Obstructive sleep apnea (adult) (pediatric): Secondary | ICD-10-CM | POA: Insufficient documentation

## 2019-08-09 DIAGNOSIS — R0683 Snoring: Secondary | ICD-10-CM

## 2019-08-19 NOTE — Procedures (Signed)
[place] A. Merlene Laughter, MD     [email or web address]             NOCTURNAL POLYSOMNOGRAPHY   LOCATION: [facility]  Patient Name: Laura Combs, Laura Combs Date: 08/09/2019 Gender: Female D.O.B: 07-28-1980 Age (years): 48 Referring Provider: Ronnald Collum Height (inches): 62 Interpreting Physician: Phillips Odor MD, ABSM Weight (lbs): 249 RPSGT: Peak, Robert BMI: 46 MRN: [id number] Neck Size: 15.50 CLINICAL INFORMATION Sleep Study Type: NPSG     Indication for sleep study: N/A     Epworth Sleepiness Score: 15     SLEEP STUDY TECHNIQUE As per the AASM Manual for the Scoring of Sleep and Associated Events v2.3 (April 2016) with a hypopnea requiring 4% desaturations.  The channels recorded and monitored were frontal, central and occipital EEG, electrooculogram (EOG), submentalis EMG (chin), nasal and oral airflow, thoracic and abdominal wall motion, anterior tibialis EMG, snore microphone, electrocardiogram, and pulse oximetry.  MEDICATIONS Medications self-administered by patient taken the night of the study : N/A  Current Outpatient Medications:  .  albuterol (VENTOLIN HFA) 108 (90 Base) MCG/ACT inhaler, Inhale 2 puffs into the lungs every 6 (six) hours as needed for wheezing or shortness of breath., Disp: 6.7 g, Rfl: 0 .  eletriptan (RELPAX) 40 MG tablet, TAKE 1 TABLET AT ONSET OF HEADACHE, MAY REPEAT ONCE IN 2 HOURS, Disp: 9 tablet, Rfl: 0 .  Erenumab-aooe (AIMOVIG) 70 MG/ML SOAJ, Inject 70 mg into the skin every 30 (thirty) days., Disp: 1 mL, Rfl: 12 .  escitalopram (LEXAPRO) 20 MG tablet, Take 1 tablet (20 mg total) by mouth daily., Disp: 90 tablet, Rfl: 1 .  etonogestrel-ethinyl estradiol (NUVARING) 0.12-0.015 MG/24HR vaginal ring, Place 1 each vaginally every 28 (twenty-eight) days. Insert vaginally and leave in place for 3 consecutive weeks, then remove for 1 week., Disp: , Rfl:  .  lisinopril (ZESTRIL) 20 MG tablet, Take 1 tablet (20 mg total) by  mouth daily. (Needs to be seen before next refill), Disp: 90 tablet, Rfl: 1 .  methylPREDNISolone (MEDROL DOSEPAK) 4 MG TBPK tablet, follow package directions, Disp: 21 tablet, Rfl: 0 .  Multiple Vitamin (MULTIVITAMIN) tablet, Take 1 tablet by mouth daily., Disp: , Rfl:      SLEEP ARCHITECTURE The study was initiated at 9:52:22 PM and ended at 5:20:23 AM.  Sleep onset time was 27.4 minutes and the sleep efficiency was 89.6%%. The total sleep time was 401.6 minutes.  Stage REM latency was 160.0 minutes.  The patient spent 5.1%% of the night in stage N1 sleep, 71.3%% in stage N2 sleep, 6.5%% in stage N3 and 17.1% in REM.  Alpha intrusion was absent.  Supine sleep was 22.42%.  RESPIRATORY PARAMETERS The overall apnea/hypopnea index (AHI) was 10.2 per hour. There were 2 total apneas, including 0 obstructive, 2 central and 0 mixed apneas. There were 66 hypopneas and 1 RERAs.  The AHI during Stage REM sleep was 13.1 per hour.  AHI while supine was 25.3 per hour.  The mean oxygen saturation was 93.9%. The minimum SpO2 during sleep was 85.0%.  moderate snoring was noted during this study.  CARDIAC DATA The 2 lead EKG demonstrated sinus rhythm. The mean heart rate was 82.5 beats per minute. Other EKG findings include: None.  LEG MOVEMENT DATA The total PLMS were 0 with a resulting PLMS index of 0.0. Associated arousal with leg movement index was 0.0.  IMPRESSIONS Mild obstructive sleep apnea is a noted with this recording. AutoPAP 8-15 is recommended.   Delano Metz, MD  Diplomate, Biomedical engineer of Sleep Medicine.  ELECTRONICALLY SIGNED ON:  08/19/2019, 12:06 PM  [facility] PH: [phone]   FX: [phone] ACCREDITED BY THE AMERICAN ACADEMY OF SLEEP MEDICINE

## 2019-08-26 ENCOUNTER — Encounter: Payer: Self-pay | Admitting: Nurse Practitioner

## 2019-09-02 ENCOUNTER — Ambulatory Visit: Payer: BC Managed Care – PPO | Admitting: Nurse Practitioner

## 2019-09-02 ENCOUNTER — Encounter: Payer: Self-pay | Admitting: Nurse Practitioner

## 2019-09-02 ENCOUNTER — Other Ambulatory Visit: Payer: Self-pay

## 2019-09-02 VITALS — BP 135/92 | HR 88 | Temp 97.8°F | Resp 20 | Ht 63.0 in | Wt 280.0 lb

## 2019-09-02 DIAGNOSIS — I10 Essential (primary) hypertension: Secondary | ICD-10-CM

## 2019-09-02 DIAGNOSIS — Z6841 Body Mass Index (BMI) 40.0 and over, adult: Secondary | ICD-10-CM

## 2019-09-02 DIAGNOSIS — F411 Generalized anxiety disorder: Secondary | ICD-10-CM | POA: Diagnosis not present

## 2019-09-02 DIAGNOSIS — L299 Pruritus, unspecified: Secondary | ICD-10-CM

## 2019-09-02 DIAGNOSIS — G43109 Migraine with aura, not intractable, without status migrainosus: Secondary | ICD-10-CM

## 2019-09-02 MED ORDER — ELETRIPTAN HYDROBROMIDE 40 MG PO TABS: ORAL_TABLET | ORAL | 3 refills | Status: DC

## 2019-09-02 MED ORDER — HYDROXYZINE HCL 10 MG PO TABS: 10.0000 mg | ORAL_TABLET | Freq: Three times a day (TID) | ORAL | 0 refills | Status: DC | PRN

## 2019-09-02 MED ORDER — LISINOPRIL 20 MG PO TABS: 20.0000 mg | ORAL_TABLET | Freq: Every day | ORAL | 1 refills | Status: DC

## 2019-09-02 NOTE — Progress Notes (Signed)
Subjective:    Patient ID: Laura Combs, female    DOB: August 13, 1980, 39 y.o.   MRN: [id number]   Chief Complaint: Recheck thyroid and discuss migraine meds    HPI:  1. Essential hypertension No c/o chest pain, sob or headache. Does not check blood pressure at home. BP Readings from Last 3 Encounters:  09/02/19 (!) 135/92  05/27/19 (!) 128/53  03/22/19 122/87     2. GAD (generalized anxiety disorder) Is very stressed all th etime. She is on lexapro and that helps some. Has episodes of itching at night. GAD 7 : Generalized Anxiety Score 09/02/2019 12/31/2018  Nervous, Anxious, on Edge 0 3  Control/stop worrying 0 2  Worry too much - different things 0 0  Trouble relaxing - 0  Restless 3 0  Easily annoyed or irritable 2 3  Afraid - awful might happen 0 0  Total GAD 7 Score - 8  Anxiety Difficulty Not difficult at all Somewhat difficult    Depression screen [facility] 2/9 09/02/2019 03/22/2019 12/31/2018  Decreased Interest 0 0 1  Down, Depressed, Hopeless 0 0 0  PHQ - 2 Score 0 0 1  Altered sleeping - - -  Tired, decreased energy - - -  Change in appetite - - -  Feeling bad or failure about yourself  - - -  Trouble concentrating - - -  Moving slowly or fidgety/restless - - -  Suicidal thoughts - - -  PHQ-9 Score - - -  Difficult doing work/chores - - -     3. Morbid obesity with BMI of 45.0-49.9, adult ([place]) Weight is up 31 lbs since November. She has not been doing exercise and has not been watching diet. Want thyroid checked.  Wt Readings from Last 3 Encounters:  09/02/19 280 lb (127 kg)  05/23/19 249 lb 12.5 oz (113.3 kg)  03/22/19 277 lb (125.6 kg)   BMI Readings from Last 3 Encounters:  09/02/19 49.60 kg/m  05/23/19 44.25 kg/m  03/22/19 50.66 kg/m       Outpatient Encounter Medications as of 09/02/2019  Medication Sig  . albuterol (VENTOLIN HFA) 108 (90 Base) MCG/ACT inhaler Inhale 2 puffs into the lungs every 6 (six) hours as needed for wheezing or  shortness of breath.  . eletriptan (RELPAX) 40 MG tablet TAKE 1 TABLET AT ONSET OF HEADACHE, MAY REPEAT ONCE IN 2 HOURS  . escitalopram (LEXAPRO) 20 MG tablet Take 1 tablet (20 mg total) by mouth daily.  [name] etonogestrel-ethinyl estradiol (NUVARING) 0.12-0.015 MG/24HR vaginal ring Place 1 each vaginally every 28 (twenty-eight) days. Insert vaginally and leave in place for 3 consecutive weeks, then remove for 1 week.  [name] lisinopril (ZESTRIL) 20 MG tablet Take 1 tablet (20 mg total) by mouth daily. (Needs to be seen before next refill)  . Multiple Vitamin (MULTIVITAMIN) tablet Take 1 tablet by mouth daily.     Past Surgical History:  Procedure Laterality Date  . DILATION AND EVACUATION  11/03/2011   Procedure: DILATATION AND EVACUATION;  Surgeon: Olga Millers, MD;  Location: [facility];  Service: Gynecology;  Laterality: N/A;    Family History  Problem Relation Age of Onset  . Diabetes Father   . Hypertension Father   . Hyperlipidemia Father   . Diabetes Maternal Aunt   . Diabetes Maternal Grandmother   . Diabetes Maternal Grandfather     New complaints: None other then what stated above  Social history: Is a school teacher  Controlled substance contract: n/a  Review of Systems  Constitutional: Negative for diaphoresis.  Eyes: Negative for pain.  Respiratory: Negative for shortness of breath.   Cardiovascular: Negative for chest pain, palpitations and leg swelling.  Gastrointestinal: Negative for abdominal pain.  Endocrine: Negative for polydipsia.  Skin: Negative for rash.  Neurological: Negative for dizziness, weakness and headaches.  Hematological: Does not bruise/bleed easily.  All other systems reviewed and are negative.      Objective:   Physical Exam Vitals and nursing note reviewed.  Constitutional:      General: She is not in acute distress.    Appearance: Normal appearance. She is well-developed.  HENT:     Head: Normocephalic.     Nose: Nose normal.    Eyes:     Pupils: Pupils are equal, round, and reactive to light.  Neck:     Vascular: No carotid bruit or JVD.  Cardiovascular:     Rate and Rhythm: Normal rate and regular rhythm.     Heart sounds: Normal heart sounds.  Pulmonary:     Effort: Pulmonary effort is normal. No respiratory distress.     Breath sounds: Normal breath sounds. No wheezing or rales.  Chest:     Chest wall: No tenderness.  Abdominal:     General: Bowel sounds are normal. There is no distension or abdominal bruit.     Palpations: Abdomen is soft. There is no hepatomegaly, splenomegaly, mass or pulsatile mass.     Tenderness: There is no abdominal tenderness.  Musculoskeletal:        General: Normal range of motion.     Cervical back: Normal range of motion and neck supple.  Lymphadenopathy:     Cervical: No cervical adenopathy.  Skin:    General: Skin is warm and dry.  Neurological:     Mental Status: She is alert and oriented to person, place, and time.     Deep Tendon Reflexes: Reflexes are normal and symmetric.  Psychiatric:        Behavior: Behavior normal.        Thought Content: Thought content normal.        Judgment: Judgment normal.     BP (!) 135/92   Pulse 88   Temp 97.8 F (36.6 C) (Temporal)   Resp 20   Ht '5\' 3"'  (1.6 m)   Wt 280 lb (127 kg)   SpO2 100%   BMI 49.60 kg/m        Assessment & Plan:  Laura Combs comes in today with chief complaint of Recheck thyroid and discuss migraine meds   Diagnosis and orders addressed:  1. Essential hypertension Low sodium diet - CMP14+EGFR - Lipid panel - CBC with Differential/Platelet  2. GAD (generalized anxiety disorder) Stress management  3. Morbid obesity with BMI of 45.0-49.9, adult ([place]) Discussed diet and exercise for person with BMI >25 Will recheck weight in 3-6 months  4. Pruritus Will try atarax and see if helps - hydrOXYzine (ATARAX/VISTARIL) 10 MG tablet; Take 1 tablet (10 mg total) by mouth 3 (three) times  daily as needed.  Dispense: 30 tablet; Refill: 0 - Thyroid Panel With TSH   Labs pending Health Maintenance reviewed Diet and exercise encouraged  Follow up plan: prn   Mary-Margaret Hassell Done, FNP

## 2019-09-04 LAB — CMP14+EGFR
ALT: 17 IU/L (ref 0–32)
AST: 18 IU/L (ref 0–40)
Albumin/Globulin Ratio: 1.8 (ref 1.2–2.2)
Albumin: 4 g/dL (ref 3.8–4.8)
Alkaline Phosphatase: 77 IU/L (ref 39–117)
BUN/Creatinine Ratio: 11 (ref 9–23)
BUN: 8 mg/dL (ref 6–20)
Bilirubin Total: <0.2 mg/dL (ref 0.0–1.2)
CO2: 20 mmol/L (ref 20–29)
Calcium: 9.4 mg/dL (ref 8.7–10.2)
Chloride: 105 mmol/L (ref 96–106)
Creatinine, Ser: 0.72 mg/dL (ref 0.57–1.00)
GFR calc Af Amer: 125 mL/min/{1.73_m2} (ref >59)
GFR calc non Af Amer: 108 mL/min/{1.73_m2} (ref >59)
Globulin, Total: 2.2 g/dL (ref 1.5–4.5)
Glucose: 82 mg/dL (ref 65–99)
Potassium: 4.3 mmol/L (ref 3.5–5.2)
Sodium: 141 mmol/L (ref 134–144)
Total Protein: 6.2 g/dL (ref 6.0–8.5)

## 2019-09-04 LAB — CBC WITH DIFFERENTIAL/PLATELET
Basophils Absolute: 0 10*3/uL (ref 0.0–0.2)
Basos: 0 %
EOS (ABSOLUTE): 0.2 10*3/uL (ref 0.0–0.4)
Eos: 2 %
Hematocrit: 41.4 % (ref 34.0–46.6)
Hemoglobin: 14.1 g/dL (ref 11.1–15.9)
Immature Grans (Abs): 0 10*3/uL (ref 0.0–0.1)
Immature Granulocytes: 0 %
Lymphocytes Absolute: 3.7 10*3/uL — ABNORMAL HIGH (ref 0.7–3.1)
Lymphs: 38 %
MCH: 30.9 pg (ref 26.6–33.0)
MCHC: 34.1 g/dL (ref 31.5–35.7)
MCV: 91 fL (ref 79–97)
Monocytes Absolute: 0.6 10*3/uL (ref 0.1–0.9)
Monocytes: 6 %
Neutrophils Absolute: 5.3 10*3/uL (ref 1.4–7.0)
Neutrophils: 54 %
Platelets: 260 10*3/uL (ref 150–450)
RBC: 4.56 x10E6/uL (ref 3.77–5.28)
RDW: 12.2 % (ref 11.7–15.4)
WBC: 9.8 10*3/uL (ref 3.4–10.8)

## 2019-09-04 LAB — LIPID PANEL
Chol/HDL Ratio: 3.8 ratio (ref 0.0–4.4)
Cholesterol, Total: 221 mg/dL — ABNORMAL HIGH (ref 100–199)
HDL: 58 mg/dL (ref >39)
LDL Chol Calc (NIH): 138 mg/dL — ABNORMAL HIGH (ref 0–99)
Triglycerides: 141 mg/dL (ref 0–149)
VLDL Cholesterol Cal: 25 mg/dL (ref 5–40)

## 2019-09-04 LAB — THYROID PANEL WITH TSH
Free Thyroxine Index: 0.9 — ABNORMAL LOW (ref 1.2–4.9)
T3 Uptake Ratio: 11 % — ABNORMAL LOW (ref 24–39)
T4, Total: 8.2 ug/dL (ref 4.5–12.0)
TSH: 1.93 u[IU]/mL (ref 0.450–4.500)

## 2019-09-17 ENCOUNTER — Encounter: Payer: Self-pay | Admitting: Nurse Practitioner

## 2019-10-01 ENCOUNTER — Other Ambulatory Visit: Payer: Self-pay | Admitting: Nurse Practitioner

## 2019-10-01 DIAGNOSIS — L299 Pruritus, unspecified: Secondary | ICD-10-CM

## 2019-10-01 NOTE — Telephone Encounter (Signed)
Last office visit 09/02/2019 Last refill 09/02/2019, #30, no refills

## 2019-10-11 ENCOUNTER — Encounter: Payer: Self-pay | Admitting: Nurse Practitioner

## 2019-10-17 ENCOUNTER — Encounter: Payer: Self-pay | Admitting: Surgery

## 2019-10-18 ENCOUNTER — Other Ambulatory Visit: Payer: Self-pay | Admitting: Surgery

## 2019-10-18 ENCOUNTER — Other Ambulatory Visit (HOSPITAL_COMMUNITY): Payer: Self-pay | Admitting: Surgery

## 2019-11-05 ENCOUNTER — Other Ambulatory Visit: Payer: Self-pay | Admitting: Nurse Practitioner

## 2019-11-05 DIAGNOSIS — L299 Pruritus, unspecified: Secondary | ICD-10-CM

## 2019-11-06 ENCOUNTER — Encounter: Payer: Self-pay | Admitting: Skilled Nursing Facility1

## 2019-11-06 ENCOUNTER — Encounter: Payer: BC Managed Care – PPO | Attending: Surgery | Admitting: Skilled Nursing Facility1

## 2019-11-06 ENCOUNTER — Other Ambulatory Visit: Payer: Self-pay

## 2019-11-06 DIAGNOSIS — E669 Obesity, unspecified: Secondary | ICD-10-CM | POA: Diagnosis present

## 2019-11-06 NOTE — Progress Notes (Addendum)
Nutrition Assessment for Bariatric Surgery Medical Nutrition Therapy  Patient was seen on 11/06/2019 for Pre-Operative Nutrition Assessment. Letter of approval faxed to [facility] bariatric surgery program coordinator on 11/06/2019  Referral stated Supervised Weight Loss (SWL) visits needed: 0  Planned surgery: RYGB Pt expectation of surgery: to lose weight Pt expectation of dietitian: none stated    NUTRITION ASSESSMENT   Anthropometrics  Start weight at [facility]: 279 lbs (date: 11/06/2019)  Height: 62.5 in BMI: 50.22 kg/m2     Clinical  Medical hx: kidney stones Medications: see list Labs: Ferritin 318 (05/27/2019): states she is not taking an iron and states she is unsure if her multi has iron in it but will check and stop it if it does have iron in it T3 uptake ratio 11; free thyroxine index .9 Cholesterol 221 LDL 138 Hx glucose capillary 170 (at same time high ferritin) Notable signs/symptoms: migraines   Lifestyle & Dietary Hx    24-Hr Dietary Recall First Meal: soda and pop tart Snack: crackers  Second Meal: string cheese Snack: string cheese Third Meal: fast food Snack:  Beverages: soda, water, sugar free twist   Estimated Energy Needs Calories: 1500 Carbohydrate: 170g Protein: 112g Fat: 42g   NUTRITION DIAGNOSIS  Overweight/obesity ([place]-3.3) related to past poor dietary habits and physical inactivity as evidenced by patient w/ planned RYGB surgery following dietary guidelines for continued weight loss.    NUTRITION INTERVENTION  Nutrition counseling (C-1) and education (E-2) to facilitate bariatric surgery goals.   Pre-Op Goals Reviewed with the Patient . Track food and beverage intake (pen and paper, MyFitness Pal, Baritastic app, etc.) . Make healthy food choices while monitoring portion sizes . Consume 3 meals per day or try to eat every 3-5 hours . Avoid concentrated sugars and fried foods . Keep sugar & fat in the single digits per  serving on food labels . Practice CHEWING your food (aim for applesauce consistency) . Practice not drinking 15 minutes before, during, and 30 minutes after each meal and snack . Avoid all carbonated beverages (ex: soda, sparkling beverages)  . Limit caffeinated beverages (ex: coffee, tea, energy drinks) . Avoid all sugar-sweetened beverages (ex: regular soda, sports drinks)  . Avoid alcohol  . Aim for 64-100 ounces of FLUID daily (with at least half of fluid intake being plain water)  . Aim for at least 60-80 grams of PROTEIN daily . Look for a liquid protein source that contains ?15 g protein and ?5 g carbohydrate (ex: shakes, drinks, shots) . Make a list of non-food related activities . Physical activity is an important part of a healthy lifestyle so keep it moving! The goal is to reach 150 minutes of exercise per week, including cardiovascular and weight baring activity.  *Goals that are bolded indicate the pt would like to start working towards these  Handouts Provided Include  . Bariatric Surgery handouts (Nutrition Visits, Pre-Op Goals, Protein Shakes, Vitamins & Minerals)  Learning Style & Readiness for Change Teaching method utilized: Visual & Auditory  Demonstrated degree of understanding via: Teach Back  Barriers to learning/adherence to lifestyle change: none stated   MONITORING & EVALUATION Dietary intake, weekly physical activity, body weight, and pre-op goals reached at next nutrition visit.    Next Steps  Patient is to follow up at [facility] for Pre-Op Class >2 weeks before surgery for further nutrition education.

## 2019-11-12 ENCOUNTER — Ambulatory Visit (HOSPITAL_COMMUNITY)
Admission: RE | Admit: 2019-11-12 | Discharge: 2019-11-12 | Disposition: A | Discharge status: Home / Self Care | Payer: BC Managed Care – PPO | Source: Ambulatory Visit | Attending: Surgery | Admitting: Surgery

## 2019-11-12 ENCOUNTER — Other Ambulatory Visit: Payer: Self-pay

## 2019-12-03 ENCOUNTER — Encounter: Payer: Self-pay | Admitting: Family

## 2019-12-03 ENCOUNTER — Other Ambulatory Visit: Payer: Self-pay | Admitting: Nurse Practitioner

## 2019-12-03 ENCOUNTER — Ambulatory Visit (INDEPENDENT_AMBULATORY_CARE_PROVIDER_SITE_OTHER): Payer: BC Managed Care – PPO | Admitting: Family

## 2019-12-03 DIAGNOSIS — L299 Pruritus, unspecified: Secondary | ICD-10-CM

## 2019-12-03 DIAGNOSIS — J019 Acute sinusitis, unspecified: Secondary | ICD-10-CM

## 2019-12-03 MED ORDER — HYDROXYZINE HCL 10 MG PO TABS: ORAL_TABLET | ORAL | 0 refills | Status: DC

## 2019-12-03 MED ORDER — AMOXICILLIN-POT CLAVULANATE 875-125 MG PO TABS: 1.0000 | ORAL_TABLET | Freq: Two times a day (BID) | ORAL | 0 refills | Status: DC

## 2019-12-03 NOTE — Progress Notes (Signed)
   Virtual Visit via telephone Note Due to COVID-19 pandemic this visit was conducted virtually. This visit type was conducted due to national recommendations for restrictions regarding the COVID-19 Pandemic (e.g. social distancing, sheltering in place) in an effort to limit this patient's exposure and mitigate transmission in our community. All issues noted in this document were discussed and addressed.  A physical exam was not performed with this format.  I connected with Laura Combs on 12/03/19 at 1:00 pm by telephone and verified that I am speaking with the correct person using two identifiers. Laura Combs is currently located at work and no one is currently with her during visit. The provider, Jannifer Rodney, FNP is located in their office at time of visit.  I discussed the limitations, risks, security and privacy concerns of performing an evaluation and management service by telephone and the availability of in person appointments. I also discussed with the patient that there may be a patient responsible charge related to this service. The patient expressed understanding and agreed to proceed.   History and Present Illness:  Sinusitis This is a new problem. The current episode started 1 to 4 weeks ago. The problem has been gradually worsening since onset. There has been no fever. Her pain is at a severity of 7/10. The pain is mild. Associated symptoms include congestion, coughing (at night), headaches, a hoarse voice, sinus pressure and sneezing. Pertinent negatives include no chills, ear pain or sore throat. Past treatments include acetaminophen, oral decongestants and spray decongestants. The treatment provided mild relief.      Review of Systems  Constitutional: Negative for chills.  HENT: Positive for congestion, hoarse voice, sinus pressure and sneezing. Negative for ear pain and sore throat.   Respiratory: Positive for cough (at night).   Neurological: Positive for headaches.    All other systems reviewed and are negative.    Observations/Objective: No SOB or distress noted   Assessment and Plan: 1. Acute sinusitis, recurrence not specified, unspecified location - Take meds as prescribed - Use a cool mist humidifier  -Use saline nose sprays frequently -Force fluids -For any cough or congestion  Use plain Mucinex- regular strength or max strength is fine -For fever or aces or pains- take tylenol or ibuprofen. -Throat lozenges if help -Call if symptoms worsen or do not improve  - amoxicillin-clavulanate (AUGMENTIN) 875-125 MG tablet; Take 1 tablet by mouth 2 (two) times daily.  Dispense: 14 tablet; Refill: 0     I discussed the assessment and treatment plan with the patient. The patient was provided an opportunity to ask questions and all were answered. The patient agreed with the plan and demonstrated an understanding of the instructions.   The patient was advised to call back or seek an in-person evaluation if the symptoms worsen or if the condition fails to improve as anticipated.  The above assessment and management plan was discussed with the patient. The patient verbalized understanding of and has agreed to the management plan. Patient is aware to call the clinic if symptoms persist or worsen. Patient is aware when to return to the clinic for a follow-up visit. Patient educated on when it is appropriate to go to the emergency department.   Time call ended:  1:11 pm  I provided 11 minutes of non-face-to-face time during this encounter.    Jannifer Rodney, FNP

## 2019-12-03 NOTE — Addendum Note (Signed)
Addended by: Lorelee Cover C on: 12/03/2019 10:49 AM   Modules accepted: Orders

## 2019-12-17 ENCOUNTER — Other Ambulatory Visit: Payer: Self-pay | Admitting: Family

## 2019-12-17 ENCOUNTER — Encounter: Payer: Self-pay | Admitting: Nurse Practitioner

## 2019-12-17 ENCOUNTER — Other Ambulatory Visit: Payer: Self-pay | Admitting: Nurse Practitioner

## 2019-12-17 MED ORDER — FLUCONAZOLE 150 MG PO TABS
150.0000 mg | ORAL_TABLET | Freq: Once | ORAL | 0 refills | Status: AC
Start: 2019-12-17 — End: 2019-12-17

## 2020-01-02 ENCOUNTER — Ambulatory Visit: Payer: Self-pay | Admitting: Surgery

## 2020-01-02 NOTE — H&P (View-Only) (Signed)
Surgical Evaluation   HPI: returns for further consultation regarding surgical management of morbid obesity.  Denies any changes to her health or life changes since our last visit a couple months ago.  She remains interested in the Roux-en-Y gastric bypass at this time.  She has made a good amount of progress on eliminating soda from her diet which is really fantastic.  She has completed the preoperative workup for bariatric surgery and no barriers are identified. Dietician: approved 11/06/19, [facility]: approved 11/12/19, [facility] CXR: negative 11/12/19 UGI: no Hiatal hernia; could not tolerate full contrast volume and no significant gastric emptying during study Labs: high cholesterol, LDL 123. Otherwise unremarkable.  Initial visit 10/17/19: This is a very pleasant 38-year-old woman who presents today with chief complaint of obesity.  She has been struggling with this since at least the age of 20.  She has tried numerous diet programs, medications, exercise programs etc. with limited success.  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  She is currently working on weaning off of this.  She typically does try to eat breakfast, often does not have time for lunch, and then overindulges at dinner, with occasional sense of not having much control.  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  She just got a cortisone shot today. She had a severe covid Infection in November requiring a six-day hospitalization and continues to have issues with shortness of breath even now.  This is another obstacle to her being able to mobilize well. Her history is notable for hypertension, hyperlipidemia, migraines, generalized  anxiety disorder and urinary incontinence. No prior abdominal surgeries No tobacco use.  She works at a preschool. Her mother and maternal aunt both had a Roux-en-Y gastric bypass and have done well in general.  281lb/ BMI 50.58 She is an excellent candidate for bariatric surgery. She is interested in the Roux-en-Y gastric bypass based on her experience with her family members. I think it would be worth considering the sleeve. She will investigate this further. We discussed the technical differences between his operations in the different anticipated long-term outcomes as well as risks. We discussed the surgery including technical aspects, the risks of bleeding, infection, pain, scarring, injury to intra-abdominal structures, staple line or anastomotic leak or abscess, chronic abdominal pain or nausea which is more common after gastric bypass, new onset or worsened GERD indication sleeve, marginal ulcer or internal hernia in the case of bypass, DVT/PE, pneumonia, heart attack, stroke, death, failure to reach weight loss goals and weight regain, hernia. Discussed the typical pre-, peri-, and postoperative course. Discussed the importance of lifelong behavioral changes to combat the chronic and relapsing disease which is obesity. Questions were answered. We will initiate her on the pathway for bariatric surgery.  No Known Allergies  Past Medical History:  Diagnosis Date   Headache(784.0)    History of kidney stones    Hypertension    Urinary incontinence     Past Surgical History:  Procedure Laterality Date   DILATION AND EVACUATION  11/03/2011   Procedure: DILATATION AND EVACUATION;  Surgeon: Mark E Anderson, MD;  Location: [facility];  Service: Gynecology;  Laterality: N/A;      Family History  Problem Relation Age of Onset   Diabetes Father    Hypertension Father    Hyperlipidemia Father    Diabetes Maternal Aunt    Diabetes Maternal Grandmother    Diabetes Maternal Grandfather     Social  History   Socioeconomic History   Marital status: Married    Spouse name: Not on file   Number of children: Not on file   Years of education: Not on file   Highest education level: Not on file  Occupational History   Not on file  Tobacco Use   Smoking status: Never Smoker   Smokeless tobacco: Never Used  Vaping Use   Vaping Use: Never used  Substance and Sexual Activity   Alcohol use: No   Drug use: No   Sexual activity: Yes    Birth control/protection: Inserts    Comment: NuvaRing  Other Topics Concern   Not on file  Social History Narrative   Not on file   Social Determinants of Health   Financial Resource Strain:    Difficulty of Paying Living Expenses:   Food Insecurity:    Worried About Programme researcher, broadcasting/film/video in the Last Year:    Barista in the Last Year:   Transportation Needs:    Freight forwarder (Medical):    Lack of Transportation (Non-Medical):   Physical Activity:    Days of Exercise per Week:    Minutes of Exercise per Session:   Stress:    Feeling of Stress :   Social Connections:    Frequency of Communication with Friends and Family:    Frequency of Social Gatherings with Friends and Family:    Attends Religious Services:    Active Member of Clubs or Organizations:    Attends Engineer, structural:    Marital Status:     Current Outpatient Medications on File Prior to Visit  Medication Sig Dispense Refill   albuterol (VENTOLIN HFA) 108 (90 Base) MCG/ACT inhaler Inhale 2 puffs into the lungs every 6 (six) hours as needed for wheezing or shortness of breath. 6.7 g 0   amoxicillin-clavulanate (AUGMENTIN) 875-125 MG tablet Take 1 tablet by mouth 2 (two) times daily. 14 tablet 0   eletriptan (RELPAX) 40 MG tablet TAKE 1 TABLET AT ONSET OF HEADACHE, MAY REPEAT ONCE IN 2 HOURS 9 tablet 3   escitalopram (LEXAPRO) 20 MG tablet Take 1 tablet (20 mg total) by mouth daily. 90 tablet 1   etonogestrel-ethinyl estradiol (NUVARING) 0.12-0.015  MG/24HR vaginal ring Place 1 each vaginally every 28 (twenty-eight) days. Insert vaginally and leave in place for 3 consecutive weeks, then remove for 1 week.     hydrOXYzine (ATARAX/VISTARIL) 10 MG tablet TAKE  (1)  TABLET  THREE TIMES DAILY AS NEEDED. 30 tablet 0   lisinopril (ZESTRIL) 20 MG tablet Take 1 tablet (20 mg total) by mouth daily. (Needs to be seen before next refill) 90 tablet 1   Multiple Vitamin (MULTIVITAMIN) tablet Take 1 tablet by mouth daily.     No current facility-administered medications on file prior to visit.    Review of Systems: a complete, 10pt review of systems was completed with pertinent positives and negatives as documented in the HPI  Physical Exam: Vitals  Weight: 281.6 lb   Height: 62.5 in  Body Surface Area: 2.22 m   Body Mass Index: 50.68 kg/m   Temp.: 98.2 F    Pulse: 111 (Regular)    BP: 132/86(Sitting, Left  Arm, Standard)  alert and well appearing  CBC Latest Ref Rng & Units 09/02/2019 05/27/2019 05/26/2019  WBC 3.4 - 10.8 x10E3/uL 9.8 11.3(H) 10.1  Hemoglobin 11.1 - 15.9 g/dL 09.4 70.9 62.8  Hematocrit 34.0 - 46.6 % 41.4 44.2 43.7  Platelets 150 - 450 x10E3/uL 260 250 248    CMP Latest Ref Rng & Units 09/02/2019 05/27/2019 05/26/2019  Glucose 65 - 99 mg/dL 82 366(Q) 947(M)  BUN 6 - 20 mg/dL 8 54(Y) 20  Creatinine 0.57 - 1.00 mg/dL 5.03 5.46 5.68  Sodium 134 - 144 mmol/L 141 139 137  Potassium 3.5 - 5.2 mmol/L 4.3 4.0 3.8  Chloride 96 - 106 mmol/L 105 105 101  CO2 20 - 29 mmol/L 20 23 23   Calcium 8.7 - 10.2 mg/dL 9.4 ) 1.2(X)  Total Protein 6.0 - 8.5 g/dL 6.2 6.1(L) 6.6  Total Bilirubin 0.0 - 1.2 mg/dL 5.1(Z 0.5 0.6  Alkaline Phos 39 - 117 IU/L 77 60 71  AST 0 - 40 IU/L 18 62(H) 103(H)  ALT 0 - 32 IU/L 17 161(H) 180(H)    No results found for: INR, PROTIME  Imaging: No results found.   A/P: MORBID OBESITY (E66.01) Story: She remains an excellent candidate for bariatric surgery, and maintains her interest in Roux-en-Y gastric  bypass. She is very well informed about the operation and sequelae as she has many family members who've been through this. We had previously discussed the surgery including technical aspects, the risks of bleeding, infection, pain, scarring, injury to intra-abdominal structures, staple line or anastomotic leak or abscess, chronic abdominal pain or nausea which is more common after gastric bypass, marginal ulcer or internal hernia in the case of bypass, DVT/PE, pneumonia, heart attack, stroke, death, failure to reach weight loss goals and weight regain, hernia. Discussed the typical peri-, and postoperative course. Discussed the importance of lifelong behavioral changes to combat the chronic and relapsing disease which is obesity. Questions were answered. She is ready to proceed with surgery and I continue to recommend this treatment for her obesity. We await insurance approval.    Patient Active Problem List   Diagnosis Date Noted   COVID-19 virus infection 05/22/2019   Acute respiratory failure due to COVID-19 (HCC) 05/22/2019   Abnormal liver function 05/22/2019   Hypertensive disorder 02/08/2017   GAD (generalized anxiety disorder) 03/02/2016   Morbid obesity with BMI of 45.0-49.9, adult (HCC) 03/02/2016   Migraine 09/28/2015       09/30/2015, MD [facility]  See AMION to contact appropriate on-call provider

## 2020-01-02 NOTE — H&P (Signed)
Surgical Evaluation   HPI: returns for further consultation regarding surgical management of morbid obesity.  Denies any changes to her health or life changes since our last visit a couple months ago.  Laura Combs remains interested in the Roux-en-Y gastric bypass at this time.  Laura Combs has made a good amount of progress on eliminating soda from her diet which is really fantastic.  Laura Combs has completed the preoperative workup for bariatric surgery and no barriers are identified. Dietician: approved 11/06/19, [place]  [employer]: approved 11/12/19, Cyndia Skeeters CXR: negative 11/12/19 UGI: no Hiatal hernia; could not tolerate full contrast volume and no significant gastric emptying during study Labs: high cholesterol, LDL 123. Otherwise unremarkable.  Initial visit 10/17/19: This is a very pleasant 39 year old woman who presents today with chief complaint of obesity.  Laura Combs has been struggling with this since at least the age of 29.  Laura Combs has tried numerous diet programs, medications, exercise programs etc. with limited success.  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  Laura Combs is currently working on weaning off of this.  Laura Combs typically does try to eat breakfast, often does not have time for lunch, and then overindulges at dinner, with occasional sense of not having much control.  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  Laura Combs just got a cortisone shot today. Laura Combs had a severe covid Infection in November requiring a six-day hospitalization and continues to have issues with shortness of breath even now.  This is another obstacle to her being able to mobilize well. Her history is notable for hypertension, hyperlipidemia, migraines, generalized  anxiety disorder and urinary incontinence. No prior abdominal surgeries No tobacco use.  Laura Combs works at a preschool. Her mother and maternal aunt both had a Roux-en-Y gastric bypass and have done well in general.  281lb/ BMI 50.58 Laura Combs is an excellent candidate for bariatric surgery. Laura Combs is interested in the Roux-en-Y gastric bypass based on her experience with her family members. I think it would be worth considering the sleeve. Laura Combs will investigate this further. We discussed the technical differences between his operations in the different anticipated long-term outcomes as well as risks. We discussed the surgery including technical aspects, the risks of bleeding, infection, pain, scarring, injury to intra-abdominal structures, staple line or anastomotic leak or abscess, chronic abdominal pain or nausea which is more common after gastric bypass, new onset or worsened GERD indication sleeve, marginal ulcer or internal hernia in the case of bypass, DVT/PE, pneumonia, heart attack, stroke, death, failure to reach weight loss goals and weight regain, hernia. Discussed the typical pre-, peri-, and postoperative course. Discussed the importance of lifelong behavioral changes to combat the chronic and relapsing disease which is obesity. Questions were answered. We will initiate her on the pathway for bariatric surgery.  No Known Allergies  Past Medical History:  Diagnosis Date   Headache(784.0)    History of kidney stones    Hypertension    Urinary incontinence     Past Surgical History:  Procedure Laterality Date   DILATION AND EVACUATION  11/03/2011   Procedure: DILATATION AND EVACUATION;  Surgeon: Levi Aland, MD;  Location: [facility];  Service: Gynecology;  Laterality: N/A;  Family History  Problem Relation Age of Onset   Diabetes Father    Hypertension Father    Hyperlipidemia Father    Diabetes Maternal Aunt    Diabetes Maternal Grandmother    Diabetes Maternal Grandfather     Social  History   Socioeconomic History   Marital status: Married    Spouse name: Not on file   Number of children: Not on file   Years of education: Not on file   Highest education level: Not on file  Occupational History   Not on file  Tobacco Use   Smoking status: Never Smoker   Smokeless tobacco: Never Used  Vaping Use   Vaping Use: Never used  Substance and Sexual Activity   Alcohol use: No   Drug use: No   Sexual activity: Yes    Birth control/protection: Inserts    Comment: NuvaRing  Other Topics Concern   Not on file  Social History Narrative   Not on file   Social Determinants of Health   Financial Resource Strain:    Difficulty of Paying Living Expenses:   Food Insecurity:    Worried About Programme researcher, broadcasting/film/video in the Last Year:    Barista in the Last Year:   Transportation Needs:    Freight forwarder (Medical):    Lack of Transportation (Non-Medical):   Physical Activity:    Days of Exercise per Week:    Minutes of Exercise per Session:   Stress:    Feeling of Stress :   Social Connections:    Frequency of Communication with Friends and Family:    Frequency of Social Gatherings with Friends and Family:    Attends Religious Services:    Active Member of Clubs or Organizations:    Attends Engineer, structural:    Marital Status:     Current Outpatient Medications on File Prior to Visit  Medication Sig Dispense Refill   albuterol (VENTOLIN HFA) 108 (90 Base) MCG/ACT inhaler Inhale 2 puffs into the lungs every 6 (six) hours as needed for wheezing or shortness of breath. 6.7 g 0   amoxicillin-clavulanate (AUGMENTIN) 875-125 MG tablet Take 1 tablet by mouth 2 (two) times daily. 14 tablet 0   eletriptan (RELPAX) 40 MG tablet TAKE 1 TABLET AT ONSET OF HEADACHE, MAY REPEAT ONCE IN 2 HOURS 9 tablet 3   escitalopram (LEXAPRO) 20 MG tablet Take 1 tablet (20 mg total) by mouth daily. 90 tablet 1   etonogestrel-ethinyl estradiol (NUVARING) 0.12-0.015  MG/24HR vaginal ring Place 1 each vaginally every 28 (twenty-eight) days. Insert vaginally and leave in place for 3 consecutive weeks, then remove for 1 week.     hydrOXYzine (ATARAX/VISTARIL) 10 MG tablet TAKE  (1)  TABLET  THREE TIMES DAILY AS NEEDED. 30 tablet 0   lisinopril (ZESTRIL) 20 MG tablet Take 1 tablet (20 mg total) by mouth daily. (Needs to be seen before next refill) 90 tablet 1   Multiple Vitamin (MULTIVITAMIN) tablet Take 1 tablet by mouth daily.     No current facility-administered medications on file prior to visit.    Review of Systems: a complete, 10pt review of systems was completed with pertinent positives and negatives as documented in the HPI  Physical Exam: Vitals  Weight: 281.6 lb   Height: 62.5 in  Body Surface Area: 2.22 m   Body Mass Index: 50.68 kg/m   Temp.: 98.2 F    Pulse: 111 (Regular)    BP: 132/86(Sitting, Left  Arm, Standard)  alert and well appearing  CBC Latest Ref Rng & Units 09/02/2019 05/27/2019 05/26/2019  WBC 3.4 - 10.8 x10E3/uL 9.8 11.3(H) 10.1  Hemoglobin 11.1 - 15.9 g/dL 09.4 70.9 62.8  Hematocrit 34.0 - 46.6 % 41.4 44.2 43.7  Platelets 150 - 450 x10E3/uL 260 250 248    CMP Latest Ref Rng & Units 09/02/2019 05/27/2019 05/26/2019  Glucose 65 - 99 mg/dL 82 366(Q) 947(M)  BUN 6 - 20 mg/dL 8 54(Y) 20  Creatinine 0.57 - 1.00 mg/dL 5.03 5.46 5.68  Sodium 134 - 144 mmol/L 141 139 137  Potassium 3.5 - 5.2 mmol/L 4.3 4.0 3.8  Chloride 96 - 106 mmol/L 105 105 101  CO2 20 - 29 mmol/L 20 23 23   Calcium 8.7 - 10.2 mg/dL 9.4 ) 1.2(X)  Total Protein 6.0 - 8.5 g/dL 6.2 6.1(L) 6.6  Total Bilirubin 0.0 - 1.2 mg/dL 5.1(Z 0.5 0.6  Alkaline Phos 39 - 117 IU/L 77 60 71  AST 0 - 40 IU/L 18 62(H) 103(H)  ALT 0 - 32 IU/L 17 161(H) 180(H)    No results found for: INR, PROTIME  Imaging: No results found.   A/P: MORBID OBESITY (E66.01) Story: Laura Combs remains an excellent candidate for bariatric surgery, and maintains her interest in Roux-en-Y gastric  bypass. Laura Combs is very well informed about the operation and sequelae as Laura Combs has many family members who've been through this. We had previously discussed the surgery including technical aspects, the risks of bleeding, infection, pain, scarring, injury to intra-abdominal structures, staple line or anastomotic leak or abscess, chronic abdominal pain or nausea which is more common after gastric bypass, marginal ulcer or internal hernia in the case of bypass, DVT/PE, pneumonia, heart attack, stroke, death, failure to reach weight loss goals and weight regain, hernia. Discussed the typical peri-, and postoperative course. Discussed the importance of lifelong behavioral changes to combat the chronic and relapsing disease which is obesity. Questions were answered. Laura Combs is ready to proceed with surgery and I continue to recommend this treatment for her obesity. We await insurance approval.    Patient Active Problem List   Diagnosis Date Noted   COVID-19 virus infection 05/22/2019   Acute respiratory failure due to COVID-19 (HCC) 05/22/2019   Abnormal liver function 05/22/2019   Hypertensive disorder 02/08/2017   GAD (generalized anxiety disorder) 03/02/2016   Morbid obesity with BMI of 45.0-49.9, adult (HCC) 03/02/2016   Migraine 09/28/2015       09/30/2015, MD [facility]  See AMION to contact appropriate on-call provider

## 2020-01-04 ENCOUNTER — Other Ambulatory Visit: Payer: Self-pay | Admitting: Nurse Practitioner

## 2020-01-04 DIAGNOSIS — F419 Anxiety disorder, unspecified: Secondary | ICD-10-CM

## 2020-01-04 DIAGNOSIS — L299 Pruritus, unspecified: Secondary | ICD-10-CM

## 2020-01-04 DIAGNOSIS — F339 Major depressive disorder, recurrent, unspecified: Secondary | ICD-10-CM

## 2020-01-13 ENCOUNTER — Encounter: Payer: BC Managed Care – PPO | Attending: Surgery | Admitting: Skilled Nursing Facility1

## 2020-01-13 ENCOUNTER — Other Ambulatory Visit: Payer: Self-pay

## 2020-01-13 DIAGNOSIS — E669 Obesity, unspecified: Secondary | ICD-10-CM | POA: Diagnosis not present

## 2020-01-13 NOTE — Progress Notes (Signed)
Pre-Operative Nutrition Class:  Appt start time: 7591   End time:  1830.  Patient was seen on 01/13/2020 for Pre-Operative Bariatric Surgery Education at [facility].    Surgery date:  Surgery type: RYGB Start weight at [facility]: 279 Weight today: 279.8   The following the learning objectives were met by the patient during this course:  Identify Pre-Op Dietary Goals and will begin 2 weeks pre-operatively  Identify appropriate sources of fluids and proteins   State protein recommendations and appropriate sources pre and post-operatively  Identify Post-Operative Dietary Goals and will follow for 2 weeks post-operatively  Identify appropriate multivitamin and calcium sources  Describe the need for physical activity post-operatively and will follow MD recommendations  State when to call healthcare provider regarding medication questions or post-operative complications  Handouts given during class include:  Pre-Op Bariatric Surgery Diet Handout  Protein Shake Handout  Post-Op Bariatric Surgery Nutrition Handout  BEL[facility] Program Information Flyer  Support Group Information Flyer  [facility] Bariatric Supplements Price List  Follow-Up Plan: Patient will follow-up at [facility] 2 weeks post operatively for diet advancement per MD.

## 2020-01-20 ENCOUNTER — Encounter: Payer: Self-pay | Admitting: Nurse Practitioner

## 2020-01-23 NOTE — Patient Instructions (Addendum)
DUE TO COVID-19 ONLY ONE VISITOR IS ALLOWED TO COME WITH YOU AND STAY IN THE WAITING ROOM ONLY DURING PRE OP AND PROCEDURE DAY OF SURGERY. THE 2 VISITORS  MAY VISIT WITH YOU AFTER SURGERY IN YOUR PRIVATE ROOM DURING VISITING HOURS ONLY!  YOU NEED TO HAVE A COVID 19 TEST ON__7/23/21_____ @_10 :00______, THIS TEST MUST BE DONE BEFORE SURGERY, COME  [address], [place] , .  [facility]) ONCE YOUR COVID TEST IS COMPLETED, PLEASE BEGIN THE QUARANTINE INSTRUCTIONS AS OUTLINED IN YOUR HANDOUT.                Laura Combs   Your procedure is scheduled on: 01/28/20   Report to [facility] Main  Entrance   Report to admitting at  9:00 AM     Call this number if you have problems the morning of surgery [phone]   . BRUSH YOUR TEETH MORNING OF SURGERY AND RINSE YOUR MOUTH OUT, NO CHEWING GUM CANDY OR MINTS.     NO SOLID FOOD AFTER 6:00 PM THE NIGHT BEFORE YOUR SURGERY. YOU MAY DRINK CLEAR FLUIDS until  8:00 am the day of surgery  CLEAR LIQUID DIET   Foods Allowed                                                                     Foods Excluded  Coffee and tea, regular and decaf                             liquids that you cannot  Plain Jell-O any favor except red or purple                                           see through such as: Fruit ices (not with fruit pulp)                                     milk, soups, orange juice  Iced Popsicles                                    All solid food apple juices Sports drinks like Gatorade Lightly seasoned clear broth or consume(fat free) Sugar, honey syrup   THE G2 DRINK should be completed by 8:00 am then nothing more by mouth.  PAIN IS EXPECTED AFTER SURGERY AND WILL NOT BE COMPLETELY ELIMINATED.   AMBULATION AND TYLENOL WILL HELP REDUCE INCISIONAL AND GAS PAIN. MOVEMENT IS KEY!  YOU ARE EXPECTED TO BE OUT OF BED WITHIN 4 HOURS OF ADMISSION TO YOUR PATIENT ROOM.  SITTING IN THE RECLINER  THROUGHOUT THE DAY IS IMPORTANT FOR DRINKING FLUIDS AND MOVING GAS THROUGHOUT THE GI TRACT.  COMPRESSION STOCKINGS SHOULD BE WORN [facility] STAY UNLESS YOU ARE WALKING.   INCENTIVE SPIROMETER SHOULD BE USED EVERY HOUR WHILE AWAKE TO DECREASE POST-OPERATIVE COMPLICATIONS SUCH AS PNEUMONIA.  WHEN DISCHARGED HOME, IT IS IMPORTANT TO CONTINUE TO  WALK EVERY HOUR AND USE THE INCENTIVE SPIROMETER EVERY HOUR.     Take these medicines the morning of surgery with A SIP OF WATER: Lexapro, Zyrtec                                 You may not have any metal on your body including hair pins and              piercings  Do not wear jewelry, make-up, lotions, powders or perfumes, deodorant             Do not wear nail polish on your fingernails.  Do not shave  48 hours prior to surgery.  .   Do not bring valuables to the hospital. [facility] IS NOT             RESPONSIBLE   FOR VALUABLES.  Contacts, dentures or bridgework may not be worn into surgery.     Name and phone number of your driver:  Special Instructions: N/A              Please read over the following fact sheets you were given: _____________________________________________________________________             [facility] Before surgery, you can play an important role.   Because skin is not sterile, your skin needs to be as free of germs as possible.   You can reduce the number of germs on your skin by washing with CHG (chlorahexidine gluconate) soap before surgery.   CHG is an antiseptic cleaner which kills germs and bonds with the skin to continue killing germs even after washing. Please DO NOT use if you have an allergy to CHG or antibacterial soaps.   If your skin becomes reddened/irritated stop using the CHG and inform your nurse when you arrive at Short Stay. Do not shave (including legs and underarms) for at least 48 hours prior to the first CHG shower.    Please follow these  instructions carefully:  1.  Shower with CHG Soap the night before surgery and the  morning of Surgery.  2.  If you choose to wash your hair, wash your hair first as usual with your  normal  shampoo.  3.  After you shampoo, rinse your hair and body thoroughly to remove the  shampoo.                                        4.  Use CHG as you would any other liquid soap.  You can apply chg directly  to the skin and wash                       Gently with a scrungie or clean washcloth.  5.  Apply the CHG Soap to your body ONLY FROM THE NECK DOWN.   Do not use on face/ open                           Wound or open sores. Avoid contact with eyes, ears mouth and genitals (private parts).                       Wash face,  Genitals (private parts) with your normal  soap.             6.  Wash thoroughly, paying special attention to the area where your surgery  will be performed.  7.  Thoroughly rinse your body with warm water from the neck down.  8.  DO NOT shower/wash with your normal soap after using and rinsing off  the CHG Soap.             9.  Pat yourself dry with a clean towel.            10.  Wear clean pajamas.            11.  Place clean sheets on your bed the night of your first shower and do not  sleep with pets. Day of Surgery : Do not apply any lotions/deodorants the morning of surgery.  Please wear clean clothes to the hospital/surgery center.     Incentive Spirometer  An incentive spirometer is a tool that can help keep your lungs clear and active. This tool measures how well you are filling your lungs with each breath. Taking long deep breaths may help reverse or decrease the chance of developing breathing (pulmonary) problems (especially infection) following:  A long period of time when you are unable to move or be active. BEFORE THE PROCEDURE   If the spirometer includes an indicator to show your best effort, your nurse or respiratory therapist will set it to a desired goal.  If  possible, sit up straight or lean slightly forward. Try not to slouch.  Hold the incentive spirometer in an upright position. INSTRUCTIONS FOR USE  1. Sit on the edge of your bed if possible, or sit up as far as you can in bed or on a chair. 2. Hold the incentive spirometer in an upright position. 3. Breathe out normally. 4. Place the mouthpiece in your mouth and seal your lips tightly around it. 5. Breathe in slowly and as deeply as possible, raising the piston or the ball toward the top of the column. 6. Hold your breath for 3-5 seconds or for as long as possible. Allow the piston or ball to fall to the bottom of the column. 7. Remove the mouthpiece from your mouth and breathe out normally. 8. Rest for a few seconds and repeat Steps 1 through 7 at least 10 times every 1-2 hours when you are awake. Take your time and take a few normal breaths between deep breaths. 9. The spirometer may include an indicator to show your best effort. Use the indicator as a goal to work toward during each repetition. 10. After each set of 10 deep breaths, practice coughing to be sure your lungs are clear. If you have an incision (the cut made at the time of surgery), support your incision when coughing by placing a pillow or rolled up towels firmly against it. Once you are able to get out of bed, walk around indoors and cough well. You may stop using the incentive spirometer when instructed by your caregiver.  RISKS AND COMPLICATIONS  Take your time so you do not get dizzy or light-headed.  If you are in pain, you may need to take or ask for pain medication before doing incentive spirometry. It is harder to take a deep breath if you are having pain. AFTER USE  Rest and breathe slowly and easily.  It can be helpful to keep track of a log of your progress. Your caregiver can provide you with a simple  table to help with this. If you are using the spirometer at home, follow these instructions: SEEK MEDICAL CARE  IF:   You are having difficultly using the spirometer.  You have trouble using the spirometer as often as instructed.  Your pain medication is not giving enough relief while using the spirometer.  You develop fever of 100.5 F (38.1 C) or higher. SEEK IMMEDIATE MEDICAL CARE IF:   You cough up bloody sputum that had not been present before.  You develop fever of 102 F (38.9 C) or greater.  You develop worsening pain at or near the incision site. MAKE SURE YOU:   Understand these instructions.  Will watch your condition.  Will get help right away if you are not doing well or get worse. Document Released: 10/31/2006 Document Revised: 09/12/2011 Document Reviewed: 01/01/2007 Exit[employer] Patient Information 2014 [employer].   ________________________________________________________________________   FAILURE TO FOLLOW THESE INSTRUCTIONS MAY RESULT IN THE CANCELLATION OF YOUR SURGERY PATIENT SIGNATURE_________________________________  NURSE SIGNATURE__________________________________  ________________________________________________________________________

## 2020-01-24 ENCOUNTER — Encounter (HOSPITAL_COMMUNITY): Payer: Self-pay

## 2020-01-24 ENCOUNTER — Encounter (HOSPITAL_COMMUNITY)
Admission: RE | Admit: 2020-01-24 | Discharge: 2020-01-24 | Disposition: A | Discharge status: Home / Self Care | Payer: BC Managed Care – PPO | Source: Ambulatory Visit | Attending: Surgery | Admitting: Surgery

## 2020-01-24 ENCOUNTER — Other Ambulatory Visit (HOSPITAL_COMMUNITY)
Admission: RE | Admit: 2020-01-24 | Discharge: 2020-01-24 | Disposition: A | Discharge status: Home / Self Care | Payer: BC Managed Care – PPO | Source: Ambulatory Visit | Attending: Surgery | Admitting: Surgery

## 2020-01-24 ENCOUNTER — Other Ambulatory Visit: Payer: Self-pay

## 2020-01-24 ENCOUNTER — Telehealth: Payer: Self-pay

## 2020-01-24 DIAGNOSIS — Z01812 Encounter for preprocedural laboratory examination: Secondary | ICD-10-CM | POA: Insufficient documentation

## 2020-01-24 DIAGNOSIS — Z20822 Contact with and (suspected) exposure to covid-19: Secondary | ICD-10-CM | POA: Insufficient documentation

## 2020-01-24 HISTORY — DX: Anxiety disorder, unspecified: F41.9

## 2020-01-24 HISTORY — DX: Depression, unspecified: F32.A

## 2020-01-24 LAB — CBC WITH DIFFERENTIAL/PLATELET
Abs Immature Granulocytes: 0.02 10*3/uL (ref 0.00–0.07)
Basophils Absolute: 0 10*3/uL (ref 0.0–0.1)
Basophils Relative: 1 %
Eosinophils Absolute: 0.2 10*3/uL (ref 0.0–0.5)
Eosinophils Relative: 2 %
HCT: 43.4 % (ref 36.0–46.0)
Hemoglobin: 13.9 g/dL (ref 12.0–15.0)
Immature Granulocytes: 0 %
Lymphocytes Relative: 43 %
Lymphs Abs: 3.3 10*3/uL (ref 0.7–4.0)
MCH: 29.5 pg (ref 26.0–34.0)
MCHC: 32 g/dL (ref 30.0–36.0)
MCV: 92.1 fL (ref 80.0–100.0)
Monocytes Absolute: 0.5 10*3/uL (ref 0.1–1.0)
Monocytes Relative: 6 %
Neutro Abs: 3.7 10*3/uL (ref 1.7–7.7)
Neutrophils Relative %: 48 %
Platelets: 209 10*3/uL (ref 150–400)
RBC: 4.71 MIL/uL (ref 3.87–5.11)
RDW: 12.6 % (ref 11.5–15.5)
WBC: 7.6 10*3/uL (ref 4.0–10.5)
nRBC: 0 % (ref 0.0–0.2)

## 2020-01-24 LAB — COMPREHENSIVE METABOLIC PANEL
ALT: 28 U/L (ref 0–44)
AST: 27 U/L (ref 15–41)
Albumin: 3.6 g/dL (ref 3.5–5.0)
Alkaline Phosphatase: 64 U/L (ref 38–126)
Anion gap: 9 (ref 5–15)
BUN: 10 mg/dL (ref 6–20)
CO2: 24 mmol/L (ref 22–32)
Calcium: 9.1 mg/dL (ref 8.9–10.3)
Chloride: 106 mmol/L (ref 98–111)
Creatinine, Ser: 0.7 mg/dL (ref 0.44–1.00)
GFR calc Af Amer: >60 mL/min (ref >60)
GFR calc non Af Amer: >60 mL/min (ref >60)
Glucose, Bld: 106 mg/dL — ABNORMAL HIGH (ref 70–99)
Potassium: 4.1 mmol/L (ref 3.5–5.1)
Sodium: 139 mmol/L (ref 135–145)
Total Bilirubin: 0.6 mg/dL (ref 0.3–1.2)
Total Protein: 6.6 g/dL (ref 6.5–8.1)

## 2020-01-24 LAB — SARS CORONAVIRUS 2 (TAT 6-24 HRS): SARS Coronavirus 2: NEGATIVE

## 2020-01-24 NOTE — Telephone Encounter (Signed)
Laura Combs (Key: [id number]) Covermymeds Key  Your information has been submitted to [employer]. To check for an updated outcome later, reopen this PA request from your dashboard.  If [employer] has not responded to your request within 24 hours, contact [employer] at [phone]

## 2020-01-24 NOTE — Progress Notes (Signed)
COVID Vaccine Completed:Yes Date COVID Vaccine completed:10/01/19 COVID vaccine manufacturer:   Moderna     PCP - Bennie Pierini FNP Cardiologist - no  Chest x-ray - 11/02/19 EKG - 05/21/20 Stress Test - no ECHO - no Cardiac Cath - no  Sleep Study - yes CPAP - no. Pt was told it was mild  Fasting Blood Sugar - NA Checks Blood Sugar _____ times a day  Blood Thinner Instructions: NA Aspirin Instructions: Last Dose:  Anesthesia review:   Patient denies shortness of breath, fever, cough and chest pain at PAT appointment  yes   Patient verbalized understanding of instructions that were given to them at the PAT appointment. Patient was also instructed that they will need to review over the PAT instructions again at home before surgery.yes  Pt reports that she gets winded walking and climbing 2 flights of stairs. She has no SOB with ADLs or housework.

## 2020-01-27 MED ORDER — BUPIVACAINE LIPOSOME 1.3 % IJ SUSP
20.0000 mL | Freq: Once | INTRAMUSCULAR | Status: DC
  Filled 2020-01-27: qty 20

## 2020-01-27 NOTE — Telephone Encounter (Signed)
approved

## 2020-01-28 ENCOUNTER — Inpatient Hospital Stay (HOSPITAL_COMMUNITY): Payer: BC Managed Care – PPO | Admitting: Certified Registered Nurse Anesthetist

## 2020-01-28 ENCOUNTER — Other Ambulatory Visit: Payer: Self-pay

## 2020-01-28 ENCOUNTER — Inpatient Hospital Stay (HOSPITAL_COMMUNITY)
Admission: RE | Admit: 2020-01-28 | Discharge: 2020-01-29 | DRG: 621 | Disposition: A | Discharge status: Home / Self Care | Payer: BC Managed Care – PPO | Attending: Surgery | Admitting: Surgery

## 2020-01-28 ENCOUNTER — Encounter (HOSPITAL_COMMUNITY)
Admission: RE | Disposition: A | Discharge status: Home / Self Care | Payer: Self-pay | Source: Home / Self Care | Attending: Surgery

## 2020-01-28 ENCOUNTER — Encounter (HOSPITAL_COMMUNITY): Payer: Self-pay | Admitting: Surgery

## 2020-01-28 DIAGNOSIS — E7800 Pure hypercholesterolemia, unspecified: Secondary | ICD-10-CM | POA: Diagnosis present

## 2020-01-28 DIAGNOSIS — Z833 Family history of diabetes mellitus: Secondary | ICD-10-CM

## 2020-01-28 DIAGNOSIS — G43909 Migraine, unspecified, not intractable, without status migrainosus: Secondary | ICD-10-CM | POA: Diagnosis present

## 2020-01-28 DIAGNOSIS — I10 Essential (primary) hypertension: Secondary | ICD-10-CM | POA: Diagnosis present

## 2020-01-28 DIAGNOSIS — Z83438 Family history of other disorder of lipoprotein metabolism and other lipidemia: Secondary | ICD-10-CM

## 2020-01-28 DIAGNOSIS — Z8249 Family history of ischemic heart disease and other diseases of the circulatory system: Secondary | ICD-10-CM | POA: Diagnosis not present

## 2020-01-28 DIAGNOSIS — Z20822 Contact with and (suspected) exposure to covid-19: Secondary | ICD-10-CM | POA: Diagnosis present

## 2020-01-28 DIAGNOSIS — Z79899 Other long term (current) drug therapy: Secondary | ICD-10-CM | POA: Diagnosis not present

## 2020-01-28 DIAGNOSIS — R32 Unspecified urinary incontinence: Secondary | ICD-10-CM | POA: Diagnosis present

## 2020-01-28 DIAGNOSIS — Z6841 Body Mass Index (BMI) 40.0 and over, adult: Secondary | ICD-10-CM | POA: Diagnosis not present

## 2020-01-28 DIAGNOSIS — F411 Generalized anxiety disorder: Secondary | ICD-10-CM | POA: Diagnosis present

## 2020-01-28 HISTORY — PX: GASTRIC ROUX-EN-Y: SHX5262

## 2020-01-28 LAB — TYPE AND SCREEN
ABO/RH(D): A POS
Antibody Screen: NEGATIVE

## 2020-01-28 LAB — PREGNANCY, URINE: Preg Test, Ur: NEGATIVE

## 2020-01-28 SURGERY — LAPAROSCOPIC ROUX-EN-Y GASTRIC BYPASS WITH UPPER ENDOSCOPY
Anesthesia: General | Site: Abdomen

## 2020-01-28 MED ORDER — PROPOFOL 10 MG/ML IV BOLUS
INTRAVENOUS | Status: AC
  Filled 2020-01-28: qty 20

## 2020-01-28 MED ORDER — ACETAMINOPHEN 500 MG PO TABS
1000.0000 mg | ORAL_TABLET | ORAL | Status: AC
  Administered 2020-01-28: 1000 mg via ORAL
  Filled 2020-01-28: qty 2

## 2020-01-28 MED ORDER — "VISTASEAL 4 ML SINGLE DOSE KIT "
4.0000 mL | PACK | Freq: Once | CUTANEOUS | Status: AC
  Administered 2020-01-28: 4 mL via TOPICAL
  Filled 2020-01-28: qty 4

## 2020-01-28 MED ORDER — LACTATED RINGERS IR SOLN
Status: DC | PRN
  Administered 2020-01-28: 1000 mL

## 2020-01-28 MED ORDER — BUPIVACAINE LIPOSOME 1.3 % IJ SUSP
INTRAMUSCULAR | Status: DC | PRN
  Administered 2020-01-28: 20 mL

## 2020-01-28 MED ORDER — MIDAZOLAM HCL 2 MG/2ML IJ SOLN
INTRAMUSCULAR | Status: AC
  Filled 2020-01-28: qty 2

## 2020-01-28 MED ORDER — ESMOLOL HCL 100 MG/10ML IV SOLN
INTRAVENOUS | Status: AC
  Filled 2020-01-28: qty 10

## 2020-01-28 MED ORDER — METHOCARBAMOL 1000 MG/10ML IJ SOLN
500.0000 mg | Freq: Four times a day (QID) | INTRAVENOUS | Status: DC | PRN
  Filled 2020-01-28: qty 5

## 2020-01-28 MED ORDER — SCOPOLAMINE 1 MG/3DAYS TD PT72: 1.0000 | MEDICATED_PATCH | TRANSDERMAL | Status: DC

## 2020-01-28 MED ORDER — DEXAMETHASONE SODIUM PHOSPHATE 4 MG/ML IJ SOLN
INTRAMUSCULAR | Status: DC | PRN
  Administered 2020-01-28: 10 mg via INTRAVENOUS

## 2020-01-28 MED ORDER — SODIUM CHLORIDE 0.9 % IV SOLN
2.0000 g | INTRAVENOUS | Status: AC
  Administered 2020-01-28: 2 g via INTRAVENOUS
  Filled 2020-01-28: qty 2

## 2020-01-28 MED ORDER — ONDANSETRON HCL 4 MG/2ML IJ SOLN
INTRAMUSCULAR | Status: DC | PRN
  Administered 2020-01-28: 4 mg via INTRAVENOUS

## 2020-01-28 MED ORDER — ENSURE MAX PROTEIN PO LIQD: 2.0000 [oz_av] | ORAL | Status: DC

## 2020-01-28 MED ORDER — FENTANYL CITRATE (PF) 250 MCG/5ML IJ SOLN
INTRAMUSCULAR | Status: AC
  Filled 2020-01-28: qty 5

## 2020-01-28 MED ORDER — OXYCODONE HCL 5 MG/5ML PO SOLN: 5.0000 mg | Freq: Once | ORAL | Status: DC | PRN

## 2020-01-28 MED ORDER — CHLORHEXIDINE GLUCONATE 4 % EX LIQD: 60.0000 mL | Freq: Once | CUTANEOUS | Status: DC

## 2020-01-28 MED ORDER — LIDOCAINE 2% (20 MG/ML) 5 ML SYRINGE
INTRAMUSCULAR | Status: AC
  Filled 2020-01-28: qty 5

## 2020-01-28 MED ORDER — CHLORHEXIDINE GLUCONATE 0.12 % MT SOLN: 15.0000 mL | Freq: Once | OROMUCOSAL | Status: DC

## 2020-01-28 MED ORDER — OXYCODONE HCL 5 MG/5ML PO SOLN
5.0000 mg | Freq: Four times a day (QID) | ORAL | Status: DC | PRN
  Administered 2020-01-29: 15:00:00 5 mg via ORAL
  Filled 2020-01-28: qty 5

## 2020-01-28 MED ORDER — PHENYLEPHRINE 40 MCG/ML (10ML) SYRINGE FOR IV PUSH (FOR BLOOD PRESSURE SUPPORT)
PREFILLED_SYRINGE | INTRAVENOUS | Status: AC
  Filled 2020-01-28: qty 10

## 2020-01-28 MED ORDER — ROCURONIUM BROMIDE 10 MG/ML (PF) SYRINGE
PREFILLED_SYRINGE | INTRAVENOUS | Status: DC | PRN
  Administered 2020-01-28: 20 mg via INTRAVENOUS
  Administered 2020-01-28: 80 mg via INTRAVENOUS

## 2020-01-28 MED ORDER — PROPOFOL 10 MG/ML IV BOLUS
INTRAVENOUS | Status: DC | PRN
  Administered 2020-01-28: 200 mg via INTRAVENOUS

## 2020-01-28 MED ORDER — HYDRALAZINE HCL 20 MG/ML IJ SOLN: 10.0000 mg | INTRAMUSCULAR | Status: DC | PRN

## 2020-01-28 MED ORDER — BUPIVACAINE-EPINEPHRINE (PF) 0.25% -1:200000 IJ SOLN
INTRAMUSCULAR | Status: AC
  Filled 2020-01-28: qty 30

## 2020-01-28 MED ORDER — TRAMADOL HCL 50 MG PO TABS
50.0000 mg | ORAL_TABLET | Freq: Four times a day (QID) | ORAL | Status: DC | PRN
  Administered 2020-01-29: 50 mg via ORAL
  Filled 2020-01-28: qty 1

## 2020-01-28 MED ORDER — 0.9 % SODIUM CHLORIDE (POUR BTL) OPTIME
TOPICAL | Status: DC | PRN
  Administered 2020-01-28: 1000 mL

## 2020-01-28 MED ORDER — GABAPENTIN 300 MG PO CAPS
300.0000 mg | ORAL_CAPSULE | ORAL | Status: AC
  Administered 2020-01-28: 300 mg via ORAL
  Filled 2020-01-28: qty 1

## 2020-01-28 MED ORDER — ACETAMINOPHEN 160 MG/5ML PO SOLN: 1000.0000 mg | Freq: Three times a day (TID) | ORAL | Status: DC

## 2020-01-28 MED ORDER — METOPROLOL TARTRATE 5 MG/5ML IV SOLN: 5.0000 mg | Freq: Four times a day (QID) | INTRAVENOUS | Status: DC | PRN

## 2020-01-28 MED ORDER — SUGAMMADEX SODIUM 200 MG/2ML IV SOLN
INTRAVENOUS | Status: DC | PRN
  Administered 2020-01-28: 300 mg via INTRAVENOUS

## 2020-01-28 MED ORDER — MEPERIDINE HCL 50 MG/ML IJ SOLN: 6.2500 mg | INTRAMUSCULAR | Status: DC | PRN

## 2020-01-28 MED ORDER — APREPITANT 40 MG PO CAPS
40.0000 mg | ORAL_CAPSULE | ORAL | Status: AC
  Administered 2020-01-28: 40 mg via ORAL
  Filled 2020-01-28: qty 1

## 2020-01-28 MED ORDER — EPHEDRINE SULFATE-NACL 50-0.9 MG/10ML-% IV SOSY
PREFILLED_SYRINGE | INTRAVENOUS | Status: DC | PRN
  Administered 2020-01-28: 7.5 mg via INTRAVENOUS
  Administered 2020-01-28 (×2): 5 mg via INTRAVENOUS

## 2020-01-28 MED ORDER — ESCITALOPRAM OXALATE 20 MG PO TABS
20.0000 mg | ORAL_TABLET | Freq: Every day | ORAL | Status: DC
  Administered 2020-01-29: 20 mg via ORAL
  Filled 2020-01-28 (×2): qty 1

## 2020-01-28 MED ORDER — PANTOPRAZOLE SODIUM 40 MG IV SOLR
40.0000 mg | Freq: Every day | INTRAVENOUS | Status: DC
  Administered 2020-01-28: 40 mg via INTRAVENOUS
  Filled 2020-01-28: qty 40

## 2020-01-28 MED ORDER — FENTANYL CITRATE (PF) 100 MCG/2ML IJ SOLN
INTRAMUSCULAR | Status: DC | PRN
  Administered 2020-01-28 (×2): 50 ug via INTRAVENOUS
  Administered 2020-01-28: 100 ug via INTRAVENOUS

## 2020-01-28 MED ORDER — SIMETHICONE 80 MG PO CHEW: 80.0000 mg | CHEWABLE_TABLET | Freq: Four times a day (QID) | ORAL | Status: DC | PRN

## 2020-01-28 MED ORDER — LACTATED RINGERS IV SOLN: INTRAVENOUS | Status: DC

## 2020-01-28 MED ORDER — ACETAMINOPHEN 500 MG PO TABS
1000.0000 mg | ORAL_TABLET | Freq: Three times a day (TID) | ORAL | Status: DC
  Administered 2020-01-28 – 2020-01-29 (×3): 1000 mg via ORAL
  Filled 2020-01-28 (×3): qty 2

## 2020-01-28 MED ORDER — MIDAZOLAM HCL 5 MG/5ML IJ SOLN
INTRAMUSCULAR | Status: DC | PRN
  Administered 2020-01-28: 2 mg via INTRAVENOUS

## 2020-01-28 MED ORDER — TRAMADOL HCL 50 MG PO TABS: 50.0000 mg | ORAL_TABLET | Freq: Four times a day (QID) | ORAL | Status: DC | PRN

## 2020-01-28 MED ORDER — KETAMINE HCL 10 MG/ML IJ SOLN
INTRAMUSCULAR | Status: DC | PRN
Start: 2020-01-28 — End: 2020-01-28
  Administered 2020-01-28: 30 mg via INTRAVENOUS

## 2020-01-28 MED ORDER — ORAL CARE MOUTH RINSE: 15.0000 mL | Freq: Once | OROMUCOSAL | Status: DC

## 2020-01-28 MED ORDER — LABETALOL HCL 5 MG/ML IV SOLN
INTRAVENOUS | Status: AC
  Filled 2020-01-28: qty 8

## 2020-01-28 MED ORDER — SCOPOLAMINE 1 MG/3DAYS TD PT72
1.0000 | MEDICATED_PATCH | TRANSDERMAL | Status: DC
  Administered 2020-01-28: 1.5 mg via TRANSDERMAL
  Filled 2020-01-28: qty 1

## 2020-01-28 MED ORDER — METOCLOPRAMIDE HCL 5 MG/ML IJ SOLN: 10.0000 mg | Freq: Once | INTRAMUSCULAR | Status: DC | PRN

## 2020-01-28 MED ORDER — HYDROMORPHONE HCL 1 MG/ML IJ SOLN: 0.5000 mg | Freq: Four times a day (QID) | INTRAMUSCULAR | Status: DC | PRN

## 2020-01-28 MED ORDER — BUPIVACAINE-EPINEPHRINE 0.25% -1:200000 IJ SOLN
INTRAMUSCULAR | Status: DC | PRN
  Administered 2020-01-28: 30 mL

## 2020-01-28 MED ORDER — KETOROLAC TROMETHAMINE 15 MG/ML IJ SOLN: 15.0000 mg | Freq: Three times a day (TID) | INTRAMUSCULAR | Status: DC | PRN

## 2020-01-28 MED ORDER — DEXAMETHASONE SODIUM PHOSPHATE 10 MG/ML IJ SOLN
INTRAMUSCULAR | Status: AC
  Filled 2020-01-28: qty 1

## 2020-01-28 MED ORDER — SODIUM CHLORIDE 0.9 % IV SOLN: INTRAVENOUS | Status: DC

## 2020-01-28 MED ORDER — HYDROXYZINE HCL 10 MG PO TABS
10.0000 mg | ORAL_TABLET | Freq: Every day | ORAL | Status: DC
  Administered 2020-01-29: 09:00:00 10 mg via ORAL
  Filled 2020-01-28: qty 1

## 2020-01-28 MED ORDER — STERILE WATER FOR IRRIGATION IR SOLN
Status: DC | PRN
  Administered 2020-01-28: 1000 mL

## 2020-01-28 MED ORDER — PHENYLEPHRINE 40 MCG/ML (10ML) SYRINGE FOR IV PUSH (FOR BLOOD PRESSURE SUPPORT)
PREFILLED_SYRINGE | INTRAVENOUS | Status: DC | PRN
  Administered 2020-01-28 (×2): 80 ug via INTRAVENOUS
  Administered 2020-01-28: 120 ug via INTRAVENOUS
  Administered 2020-01-28: 80 ug via INTRAVENOUS

## 2020-01-28 MED ORDER — ONDANSETRON HCL 4 MG/2ML IJ SOLN: 4.0000 mg | INTRAMUSCULAR | Status: DC | PRN

## 2020-01-28 MED ORDER — METOCLOPRAMIDE HCL 5 MG/ML IJ SOLN
10.0000 mg | Freq: Four times a day (QID) | INTRAMUSCULAR | Status: DC
  Filled 2020-01-28 (×2): qty 2

## 2020-01-28 MED ORDER — OXYCODONE HCL 5 MG PO TABS: 5.0000 mg | ORAL_TABLET | Freq: Once | ORAL | Status: DC | PRN

## 2020-01-28 MED ORDER — DOCUSATE SODIUM 100 MG PO CAPS
100.0000 mg | ORAL_CAPSULE | Freq: Two times a day (BID) | ORAL | Status: DC
  Administered 2020-01-28 – 2020-01-29 (×2): 100 mg via ORAL
  Filled 2020-01-28 (×2): qty 1

## 2020-01-28 MED ORDER — FENTANYL CITRATE (PF) 100 MCG/2ML IJ SOLN
INTRAMUSCULAR | Status: AC
  Filled 2020-01-28: qty 2

## 2020-01-28 MED ORDER — ONDANSETRON HCL 4 MG/2ML IJ SOLN
INTRAMUSCULAR | Status: AC
  Filled 2020-01-28: qty 2

## 2020-01-28 MED ORDER — LIDOCAINE 20MG/ML (2%) 15 ML SYRINGE OPTIME
INTRAMUSCULAR | Status: DC | PRN
Start: 2020-01-28 — End: 2020-01-28
  Administered 2020-01-28: 1.5 mg/kg/h via INTRAVENOUS

## 2020-01-28 MED ORDER — HEPARIN SODIUM (PORCINE) 5000 UNIT/ML IJ SOLN
5000.0000 [IU] | INTRAMUSCULAR | Status: AC
  Administered 2020-01-28: 5000 [IU] via SUBCUTANEOUS
  Filled 2020-01-28: qty 1

## 2020-01-28 MED ORDER — ELETRIPTAN HYDROBROMIDE 40 MG PO TABS
40.0000 mg | ORAL_TABLET | ORAL | Status: DC | PRN
  Filled 2020-01-28: qty 1

## 2020-01-28 MED ORDER — CHLORHEXIDINE GLUCONATE 4 % EX LIQD
60.0000 mL | Freq: Once | CUTANEOUS | Status: AC
  Administered 2020-01-28: 4 via TOPICAL

## 2020-01-28 MED ORDER — LIDOCAINE 2% (20 MG/ML) 5 ML SYRINGE
INTRAMUSCULAR | Status: DC | PRN
  Administered 2020-01-28: 60 mg via INTRAVENOUS

## 2020-01-28 MED ORDER — FENTANYL CITRATE (PF) 100 MCG/2ML IJ SOLN
25.0000 ug | INTRAMUSCULAR | Status: DC | PRN
  Administered 2020-01-28 (×2): 50 ug via INTRAVENOUS

## 2020-01-28 MED ORDER — EPHEDRINE 5 MG/ML INJ
INTRAVENOUS | Status: AC
  Filled 2020-01-28: qty 10

## 2020-01-28 MED ORDER — ENOXAPARIN SODIUM 30 MG/0.3ML ~~LOC~~ SOLN
30.0000 mg | Freq: Two times a day (BID) | SUBCUTANEOUS | Status: DC
  Administered 2020-01-29: 30 mg via SUBCUTANEOUS
  Filled 2020-01-28: qty 0.3

## 2020-01-28 MED ORDER — LABETALOL HCL 5 MG/ML IV SOLN
INTRAVENOUS | Status: DC | PRN
Start: 2020-01-28 — End: 2020-01-28
  Administered 2020-01-28 (×2): 5 mg via INTRAVENOUS

## 2020-01-28 MED ORDER — ROCURONIUM BROMIDE 10 MG/ML (PF) SYRINGE
PREFILLED_SYRINGE | INTRAVENOUS | Status: AC
  Filled 2020-01-28: qty 10

## 2020-01-28 SURGICAL SUPPLY — 78 items
APL LAPSCP 35 DL APL RGD (MISCELLANEOUS) ×1
APL PRP STRL LF DISP 70% ISPRP (MISCELLANEOUS) ×2
APL SKNCLS STERI-STRIP NONHPOA (GAUZE/BANDAGES/DRESSINGS) ×1
APPLICATOR COTTON TIP 6 STRL (MISCELLANEOUS) IMPLANT
APPLICATOR COTTON TIP 6IN STRL (MISCELLANEOUS)
APPLICATOR VISTASEAL 35 (MISCELLANEOUS) ×3 IMPLANT
APPLIER CLIP ROT 13.4 12 LRG (CLIP)
APR CLP LRG 13.4X12 ROT 20 MLT (CLIP)
BENZOIN TINCTURE PRP APPL 2/3 (GAUZE/BANDAGES/DRESSINGS) ×3 IMPLANT
BLADE SURG SZ11 CARB STEEL (BLADE) ×3 IMPLANT
BNDG ADH 1X3 SHEER STRL LF (GAUZE/BANDAGES/DRESSINGS) ×3 IMPLANT
BNDG ADH THN 3X1 STRL LF (GAUZE/BANDAGES/DRESSINGS) ×1
CABLE HIGH FREQUENCY MONO STRZ (ELECTRODE) IMPLANT
CHLORAPREP W/TINT 26 (MISCELLANEOUS) ×6 IMPLANT
CLIP APPLIE ROT 13.4 12 LRG (CLIP) IMPLANT
CLIP SUT LAPRA TY ABSORB (SUTURE) ×6 IMPLANT
CLOSURE WOUND 1/2 X4 (GAUZE/BANDAGES/DRESSINGS) ×1
COVER SURGICAL LIGHT HANDLE (MISCELLANEOUS) ×3 IMPLANT
COVER WAND RF STERILE (DRAPES) IMPLANT
DEVICE SUT QUICK LOAD TK 5 (STAPLE) IMPLANT
DEVICE SUT TI-KNOT TK 5X26 (MISCELLANEOUS) IMPLANT
DEVICE SUTURE ENDOST 10MM (ENDOMECHANICALS) ×3 IMPLANT
DEVICE TI KNOT TK5 (MISCELLANEOUS)
DRAIN PENROSE 0.25X18 (DRAIN) ×3 IMPLANT
ELECT REM PT RETURN 15FT ADLT (MISCELLANEOUS) ×3 IMPLANT
GAUZE 4X4 16PLY RFD (DISPOSABLE) ×3 IMPLANT
GAUZE SPONGE 4X4 12PLY STRL (GAUZE/BANDAGES/DRESSINGS) IMPLANT
GLOVE BIO SURGEON STRL SZ 6 (GLOVE) ×3 IMPLANT
GLOVE INDICATOR 6.5 STRL GRN (GLOVE) ×3 IMPLANT
GOWN STRL REUS W/TWL LRG LVL3 (GOWN DISPOSABLE) ×3 IMPLANT
GOWN STRL REUS W/TWL XL LVL3 (GOWN DISPOSABLE) ×9 IMPLANT
HEMOSTAT SNOW SURGICEL 2X4 (HEMOSTASIS) ×3 IMPLANT
HOVERMATT SINGLE USE (MISCELLANEOUS) ×3 IMPLANT
KIT BASIN OR (CUSTOM PROCEDURE TRAY) ×3 IMPLANT
KIT GASTRIC LAVAGE 34FR ADT (SET/KITS/TRAYS/PACK) ×3 IMPLANT
KIT TURNOVER KIT A (KITS) IMPLANT
MARKER SKIN DUAL TIP RULER LAB (MISCELLANEOUS) ×3 IMPLANT
NEEDLE SPNL 22GX3.5 QUINCKE BK (NEEDLE) ×3 IMPLANT
PACK CARDIOVASCULAR III (CUSTOM PROCEDURE TRAY) ×3 IMPLANT
PENCIL SMOKE EVACUATOR (MISCELLANEOUS) IMPLANT
QUICK LOAD TK 5 (STAPLE)
RELOAD ENDO STITCH 2.0 (ENDOMECHANICALS) ×24
RELOAD STAPLER BLUE 60MM (STAPLE) ×3 IMPLANT
RELOAD STAPLER GOLD 60MM (STAPLE) ×1 IMPLANT
RELOAD STAPLER GREEN 60MM (STAPLE) ×1 IMPLANT
RELOAD STAPLER WHITE 60MM (STAPLE) ×2 IMPLANT
SCISSORS LAP 5X45 EPIX DISP (ENDOMECHANICALS) ×3 IMPLANT
SET IRRIG TUBING LAPAROSCOPIC (IRRIGATION / IRRIGATOR) ×3 IMPLANT
SET TUBE SMOKE EVAC HIGH FLOW (TUBING) ×3 IMPLANT
SHEARS HARMONIC ACE PLUS 45CM (MISCELLANEOUS) ×3 IMPLANT
SLEEVE XCEL OPT CAN 5 100 (ENDOMECHANICALS) ×6 IMPLANT
SOL ANTI FOG 6CC (MISCELLANEOUS) ×1 IMPLANT
SOLUTION ANTI FOG 6CC (MISCELLANEOUS) ×2
STAPLER ECHELON BIOABSB 60 FLE (MISCELLANEOUS) IMPLANT
STAPLER ECHELON LONG 60 440 (INSTRUMENTS) ×3 IMPLANT
STAPLER RELOAD BLUE 60MM (STAPLE) ×9
STAPLER RELOAD GOLD 60MM (STAPLE) ×3
STAPLER RELOAD GREEN 60MM (STAPLE) ×3
STAPLER RELOAD WHITE 60MM (STAPLE) ×6
STRIP CLOSURE SKIN 1/2X4 (GAUZE/BANDAGES/DRESSINGS) ×2 IMPLANT
SUT MNCRL AB 4-0 PS2 18 (SUTURE) ×3 IMPLANT
SUT RELOAD ENDO STITCH 2 48X1 (ENDOMECHANICALS) ×4
SUT RELOAD ENDO STITCH 2.0 (ENDOMECHANICALS) ×4
SUT SURGIDAC NAB ES-9 0 48 120 (SUTURE) IMPLANT
SUT VIC AB 2-0 SH 27 (SUTURE) ×3
SUT VIC AB 2-0 SH 27X BRD (SUTURE) ×1 IMPLANT
SUTURE RELOAD END STTCH 2 48X1 (ENDOMECHANICALS) ×4 IMPLANT
SUTURE RELOAD ENDO STITCH 2.0 (ENDOMECHANICALS) ×4 IMPLANT
SYR 10ML ECCENTRIC (SYRINGE) ×3 IMPLANT
SYR 20ML LL LF (SYRINGE) ×6 IMPLANT
TOWEL OR 17X26 10 PK STRL BLUE (TOWEL DISPOSABLE) ×3 IMPLANT
TOWEL OR NON WOVEN STRL DISP B (DISPOSABLE) ×3 IMPLANT
TRAY FOLEY MTR SLVR 16FR STAT (SET/KITS/TRAYS/PACK) ×3 IMPLANT
TROCAR BLADELESS OPT 5 100 (ENDOMECHANICALS) ×3 IMPLANT
TROCAR UNIVERSAL OPT 12M 100M (ENDOMECHANICALS) ×3 IMPLANT
TROCAR XCEL 12X100 BLDLESS (ENDOMECHANICALS) ×3 IMPLANT
TUBING CONNECTING 10 (TUBING) IMPLANT
TUBING CONNECTING 10' (TUBING)

## 2020-01-28 NOTE — Anesthesia Procedure Notes (Signed)
Procedure Name: Intubation Date/Time: 01/28/2020 10:33 AM Performed by: Claudia Desanctis, CRNA Pre-anesthesia Checklist: Patient identified, Emergency Drugs available, Suction available and Patient being monitored Patient Re-evaluated:Patient Re-evaluated prior to induction Oxygen Delivery Method: Circle system utilized Preoxygenation: Pre-oxygenation with 100% oxygen Induction Type: IV induction Ventilation: Mask ventilation without difficulty Laryngoscope Size: Mac and 3 Grade View: Grade I Tube type: Oral Number of attempts: 1 Airway Equipment and Method: Stylet Placement Confirmation: ETT inserted through vocal cords under direct vision,  positive ETCO2 and breath sounds checked- equal and bilateral Secured at: 22 cm Tube secured with: Tape Dental Injury: Teeth and Oropharynx as per pre-operative assessment

## 2020-01-28 NOTE — Progress Notes (Signed)
Discussed post op day goals with patient including ambulation, IS, diet progression, pain, and nausea control.  BSTOP education provided including BSTOP information guide, "Guide for Pain Management after your Bariatric Procedure".  Questions answered. 

## 2020-01-28 NOTE — Anesthesia Preprocedure Evaluation (Addendum)
Anesthesia Evaluation  Patient identified by MRN, date of birth, ID band Patient awake    Reviewed: Allergy & Precautions, NPO status , Patient's Chart, lab work & pertinent test results  Airway Mallampati: II  TM Distance: >3 FB Neck ROM: Full    Dental no notable dental hx. (+) Teeth Intact   Pulmonary pneumonia, resolved,  Covid-19 11/20 Hx/o respiratory failure Snores   Pulmonary exam normal breath sounds clear to auscultation       Cardiovascular hypertension, Normal cardiovascular exam Rhythm:Regular Rate:Normal     Neuro/Psych  Headaches, PSYCHIATRIC DISORDERS Anxiety Depression    GI/Hepatic negative GI ROS, Neg liver ROS,   Endo/Other  Morbid obesity  Renal/GU negative Renal ROSHx/o renal calculi Bladder dysfunction  Urinary incontinence    Musculoskeletal negative musculoskeletal ROS (+)   Abdominal (+) + obese,   Peds  Hematology negative hematology ROS (+)   Anesthesia Other Findings   Reproductive/Obstetrics                            Anesthesia Physical Anesthesia Plan  ASA: III  Anesthesia Plan: General   Post-op Pain Management:    Induction: Intravenous  PONV Risk Score and Plan: 4 or greater and Midazolam, Ondansetron, Scopolamine patch - Pre-op, Dexamethasone and Treatment may vary due to age or medical condition  Airway Management Planned: Oral ETT  Additional Equipment:   Intra-op Plan:   Post-operative Plan: Extubation in OR  Informed Consent: I have reviewed the patients History and Physical, chart, labs and discussed the procedure including the risks, benefits and alternatives for the proposed anesthesia with the patient or authorized representative who has indicated his/her understanding and acceptance.     Dental advisory given  Plan Discussed with: CRNA, Anesthesiologist and Surgeon  Anesthesia Plan Comments:         Anesthesia Quick  Evaluation

## 2020-01-28 NOTE — Discharge Instructions (Signed)
   GASTRIC BYPASS/SLEEVE  Home Care Instructions   These instructions are to help you care for yourself when you go home.  Call: If you have any problems. . Call [phone] and ask for the surgeon on call . If you need immediate help, come to the ER at [facility].  . Tell the ER staff that you are a new post-op gastric bypass or gastric sleeve patient   Signs and symptoms to report: . Severe vomiting or nausea o If you cannot keep down clear liquids for longer than 1 day, call your surgeon  . Abdominal pain that does not get better after taking your pain medication . Fever over 100.4 F with chills . Heart beating over 100 beats a minute . Shortness of breath at rest . Chest pain .  Redness, swelling, drainage, or foul odor at incision (surgical) sites .  If your incisions open or pull apart . Swelling or pain in calf (lower leg) . Diarrhea (Loose bowel movements that happen often), frequent watery, uncontrolled bowel movements . Constipation, (no bowel movements for 3 days) if this happens: Pick one o Milk of Magnesia, 2 tablespoons by mouth, 3 times a day for 2 days if needed o Stop taking Milk of Magnesia once you have a bowel movement o Call your doctor if constipation continues Or o Miralax  (instead of Milk of Magnesia) following the label instructions o Stop taking Miralax once you have a bowel movement o Call your doctor if constipation continues . Anything you think is not normal   Normal side effects after surgery: . Unable to sleep at night or unable to focus . Irritability or moody . Being tearful (crying) or depressed These are common complaints, possibly related to your anesthesia medications that put you to sleep, stress of surgery, and change in lifestyle.  This usually goes away a few weeks after surgery.  If these feelings continue, call your primary care doctor.   Wound Care: You may have surgical glue, steri-strips, or staples over your incisions after  surgery . Surgical glue:  Looks like a clear film over your incisions and will wear off a little at a time . Steri-strips: Strips of tape over your incisions. You may notice a yellowish color on the skin under the steri-strips. This is used to make the   steri-strips stick better. Do not pull the steri-strips off - let them fall off . Staples: Staples may be removed before you leave the hospital o If you go home with staples, call [facility], [phone] at for an appointment with your surgeon's nurse to have staples removed 10 days after surgery. . Showering: You may shower two (2) days after your surgery unless your surgeon tells you differently o Wash gently around incisions with warm soapy water, rinse well, and gently pat dry  o No tub baths until staples are removed, steri-strips fall off or glue is gone.    Medications: . Medications should be liquid or crushed if larger than the size of a dime . Extended release pills (medication that release a little bit at a time through the day) should NOT be crushed or cut. (examples include XL, ER, DR, SR) . Depending on the size and number of medications you take, you may need to space (take a few throughout the day)/change the time you take your medications so that you do not over-fill your pouch (smaller stomach) . Make sure you follow-up with your primary care doctor to   make medication changes needed during rapid weight loss and life-style changes . If you have diabetes, follow up with the doctor that orders your diabetes medication(s) within one week after surgery and check your blood sugar regularly. . Do not drive while taking prescription pain medication  . It is ok to take Tylenol by the bottle instructions with your pain medicine or instead of your pain medicine as needed.  DO NOT TAKE NSAIDS (EXAMPLES OF NSAIDS:  IBUPROFREN/ NAPROXEN)  Diet:                    First 2 Weeks  You will see the dietician t about two (2) weeks  after your surgery. The dietician will increase the types of foods you can eat if you are handling liquids well: . If you have severe vomiting or nausea and cannot keep down clear liquids lasting longer than 1 day, call your surgeon @ ([phone]) Protein Shake . Drink at least 2 ounces of shake 5-6 times per day . Each serving of protein shakes (usually 8 - 12 ounces) should have: o 15 grams of protein  o And no more than 5 grams of carbohydrate  . Goal for protein each day: o Men = 80 grams per day o Women = 60 grams per day . Protein powder may be added to fluids such as non-fat milk or Lactaid milk or unsweetened Soy/Almond milk (limit to 35 grams added protein powder per serving)  Hydration . Slowly increase the amount of water and other clear liquids as tolerated (See Acceptable Fluids) . Slowly increase the amount of protein shake as tolerated  .  Sip fluids slowly and throughout the day.  Do not use straws. . May use sugar substitutes in small amounts (no more than 6 - 8 packets per day; i.e. Splenda)  Fluid Goal . The first goal is to drink at least 8 ounces of protein shake/drink per day (or as directed by the nutritionist); some examples of protein shakes are Syntrax Nectar, Adkins Advantage, EAS Edge HP, and Unjury. See handout from pre-op Bariatric Education Class: o Slowly increase the amount of protein shake you drink as tolerated o You may find it easier to slowly sip shakes throughout the day o It is important to get your proteins in first . Your fluid goal is to drink 64 - 100 ounces of fluid daily o It may take a few weeks to build up to this . 32 oz (or more) should be clear liquids  And  . 32 oz (or more) should be full liquids (see below for examples) . Liquids should not contain sugar, caffeine, or carbonation  Clear Liquids: . Water or Sugar-free flavored water (i.e. Fruit H2O, Propel) . Decaffeinated coffee or tea (sugar-free) . Crystal Lite, Wyler's Lite,  Minute Maid Lite . Sugar-free Jell-O . Bouillon or broth . Sugar-free Popsicle:   *Less than 20 calories each; Limit 1 per day  Full Liquids: Protein Shakes/Drinks + 2 choices per day of other full liquids . Full liquids must be: o No More Than 15 grams of Carbs per serving  o No More Than 3 grams of Fat per serving . Strained low-fat cream soup (except Cream of Potato or Tomato) . Non-Fat milk . Fat-free Lactaid Milk . Unsweetened Soy Or Unsweetened Almond Milk . Low Sugar yogurt (Dannon Lite & Fit, Greek yogurt; Oikos Triple Zero; Chobani Simply 100; Yoplait 100 calorie Greek - No Fruit on the Bottom)    Vitamins   and Minerals . Start 1 day after surgery unless otherwise directed by your surgeon . Chewable Bariatric Specific Multivitamin / Multimineral Supplement with iron (Example: Bariatric Advantage Multi EA) . Chewable Calcium with Vitamin D-3 (Example: 3 Chewable Calcium Plus 600 with Vitamin D-3) o Take 500 mg three (3) times a day for a total of 1500 mg each day o Do not take all 3 doses of calcium at one time as it may cause constipation, and you can only absorb 500 mg  at a time  o Do not mix multivitamins containing iron with calcium supplements; take 2 hours apart . Menstruating women and those with a history of anemia (a blood disease that causes weakness) may need extra iron o Talk with your doctor to see if you need more iron . Do not stop taking or change any vitamins or minerals until you talk to your dietitian or surgeon . Your Dietitian and/or surgeon must approve all vitamin and mineral supplements   Activity and Exercise: Limit your physical activity as instructed by your doctor.  It is important to continue walking at home.  During this time, use these guidelines: . Do not lift anything greater than ten (10) pounds for at least two (2) weeks . Do not go back to work or drive until Designer, industrial/product says you can . You may have sex when you feel comfortable  o It is  VERY important for female patients to use a reliable birth control method; fertility often increases after surgery  o All hormonal birth control will be ineffective for 30 days after surgery due to medications given during surgery a barrier method must be used. o Do not get pregnant for at least 18 months . Start exercising as soon as your doctor tells you that you can o Make sure your doctor approves any physical activity . Start with a simple walking program . Walk 5-15 minutes each day, 7 days per week.  . Slowly increase until you are walking 30-45 minutes per day Consider joining our BELT program. [phone] or email [email or web address] .[email or web address]   Special Instructions Things to remember: . Use your CPAP when sleeping if this applies to you  . [facility] has two free Bariatric Surgery Support Groups that meet monthly o The 3rd Thursday of each month, 6 pm o The 2nd Friday of each month, 11:30 am . It is very important to keep all follow up appointments with your surgeon, dietitian, primary care physician, and behavioral health practitioner . Routine follow up schedule with your surgeon include appointments at 2-3 weeks, 6-8 weeks, 6 months, and 1 year at a minimum.  Your surgeon may request to see you more often.   . After the first year, please follow up with your bariatric surgeon and dietitian at least once a year in order to maintain best weight loss results   [facility]: [phone] [facility]: [phone] Bariatric Nurse Coordinator: [phone]      Reviewed and Endorsed  by [facility] Patient Education Committee, June, 2016 Edits Approved: Aug, 2018

## 2020-01-28 NOTE — Op Note (Signed)
Name:  [name] MRN: [id number] Date of Surgery: 01/28/2020  Preop Diagnosis:  Morbid Obesity, S/P RYGB  Postop Diagnosis:  Morbid Obesity, S/P RYGB (Weight - 127 kg, BMI - 51.2)  Procedure:  Upper endoscopy  (Intraoperative)  Surgeon:  Ovidio Kin, M.D.  Anesthesia:  GET  Indications for procedure: Laura Combs is a 39 y.o. female whose primary care physician is Daphine Deutscher, Mary-Margaret, FNP and has completed a Roux-en-Y gastric bypass today by Dr. Fredricka Bonine.  I am doing an intraoperative upper endoscopy to evaluate the gastric pouch and the gastro-jejunal anastomosis.  Operative Note: The patient is under general anesthesia.  Dr. Fredricka Bonine is laparoscoping the patient while I do an upper endoscopy to evaluate the stomach pouch and gastrojejunal anastomosis.  With the patient intubated, I passed the Olympus endoscope without difficulty down the esophagus.  She did have a mucosal disruption about 2 cm above the EG junction.  This was probably from the E wall tube.  The esophago-gastric junction was at 38 cm.  The gastro-jejunal anastomosis was at 43 cm.  The mucosa of the stomach looked viable and the staple line was intact without bleeding.  The gastro-jejunal anastomosis looked okay.  While I insufflated the stomach pouch with air, Dr. Fredricka Bonine clamped off the efferent limb of the jejunum.  He then flooded the upper abdomen with saline to put the gastric pouch and gastro-jejunal anastomosis under saline.  There was no bubbling or evidence of a leak.    The scope was then withdrawn.  The esophagus was unremarkable and the patient tolerated the endoscopy without difficulty.  Ovidio Kin, MD, [facility] Surgery Office phone:  [phone]

## 2020-01-28 NOTE — Transfer of Care (Signed)
Immediate Anesthesia Transfer of Care Note  Patient: Laura Combs  Procedure(s) Performed: LAPAROSCOPIC ROUX-EN-Y GASTRIC BYPASS WITH UPPER ENDOSCOPY (N/A Abdomen)  Patient Location: PACU  Anesthesia Type:General  Level of Consciousness: awake and patient cooperative  Airway & Oxygen Therapy: Patient Spontanous Breathing and Patient connected to face mask  Post-op Assessment: Report given to RN and Post -op Vital signs reviewed and stable  Post vital signs: Reviewed and stable  Last Vitals:  Vitals Value Taken Time  BP    Temp    Pulse 91 01/28/20 1307  Resp 13 01/28/20 1307  SpO2 92 % 01/28/20 1307  Vitals shown include unvalidated device data.  Last Pain:  Vitals:   01/28/20 0932  TempSrc: Oral      Patients Stated Pain Goal: 4 (01/28/20 0956)  Complications: No complications documented.

## 2020-01-28 NOTE — Progress Notes (Signed)
PHARMACY CONSULT FOR:  Risk Assessment for Post-Discharge VTE Following Bariatric Surgery  Post-Discharge VTE Risk Assessment: This patient's probability of 30-day post-discharge VTE is increased due to the factors marked:   Female    Age >/=60 years  x  BMI >/=50 kg/m2    CHF    Dyspnea at Rest    Paraplegia  x  Non-gastric-band surgery    Operation Time >/=3 hr    Return to OR     Length of Stay >/= 3 d   Hx of VTE   Hypercoagulable condition   Significant venous stasis       Predicted probability of 30-day post-discharge VTE: 0.27%  Other patient-specific factors to consider: n/a   Recommendation for Discharge: No pharmacologic prophylaxis post-discharge    Laura Combs is a 39 y.o. female who underwent laparoscopic Roux-en-Y gastric bypass on 01/28/20   Case start: 1053 Case end: 1255   No Known Allergies  Patient Measurements: Height: 5\' 2"  (157.5 cm) Weight: (!) 127 kg (280 lb) IBW/kg (Calculated) : 50.1 Body mass index is 51.21 kg/m.  No results for input(s): WBC, HGB, HCT, PLT, APTT, CREATININE, LABCREA, CREATININE, CREAT24HRUR, MG, PHOS, ALBUMIN, PROT, ALBUMIN, AST, ALT, ALKPHOS, BILITOT, BILIDIR, IBILI in the last 72 hours. Estimated Creatinine Clearance: 123 mL/min (by C-G formula based on SCr of 0.7 mg/dL).    Past Medical History:  Diagnosis Date  . Anxiety   . Depression   . 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 History of kidney stones 2017  . Hypertension   . Pneumonia 05/2019  . Urinary incontinence      Medications Prior to Admission  Medication Sig Dispense Refill Last Dose  . cetirizine (ZYRTEC) 10 MG tablet Take 10 mg by mouth daily.   01/27/2020 at Unknown time  . eletriptan (RELPAX) 40 MG tablet TAKE 1 TABLET AT ONSET OF HEADACHE, MAY REPEAT ONCE IN 2 HOURS (Patient taking differently: Take 40 mg by mouth every 2 (two) hours as needed for migraine. ) 9 tablet 3 Past Week at Unknown time  . escitalopram (LEXAPRO) 20 MG tablet  TAKE 1 TABLET DAILY (Patient taking differently: Take 20 mg by mouth daily. ) 30 tablet 0 01/27/2020 at Unknown time  . etonogestrel-ethinyl estradiol (NUVARING) 0.12-0.015 MG/24HR vaginal ring Place 1 each vaginally every 28 (twenty-eight) days. Insert vaginally and leave in place for 3 consecutive weeks, then remove for 1 week.   01/27/2020 at Unknown time  . hydrOXYzine (ATARAX/VISTARIL) 10 MG tablet TAKE (1) TABLET THREE TIMES DAILY AS NEEDED. (Patient taking differently: Take 10 mg by mouth daily. TAKE  (1)  TABLET  THREE TIMES DAILY AS NEEDED.) 30 tablet 0 01/27/2020 at Unknown time  . lisinopril (ZESTRIL) 20 MG tablet Take 1 tablet (20 mg total) by mouth daily. (Needs to be seen before next refill) 90 tablet 1 01/27/2020 at Unknown time  . Multiple Vitamin (MULTIVITAMIN) tablet Take 1 tablet by mouth daily.   Past Week at Unknown time       01/29/2020 01/28/2020,3:16 PM

## 2020-01-28 NOTE — Op Note (Signed)
Operative Note  Laura Combs  [id number]  [id number]  01/28/2020   Surgeon: Berna Bue MD   Assistant: Ovidio Kin MD   Procedure performed: laparoscopic Roux-en-Y gastric bypass ( antecolic, antegastric) , upper endoscopy   Preop diagnosis: Morbid obesity Body mass index is 51.21 kg/m. Post-op diagnosis/intraop findings: same   Specimens: none Retained items: none  EBL: 30 cc Complications: none   Description of procedure: After obtaining informed consent and administration of prophylactic heparin in holding, the patient was taken to the operating room and placed supine on operating room table where general endotracheal anesthesia was initiated, preoperative antibiotics were administered, SCDs applied, and a formal timeout was performed. The abdomen was prepped and draped in usual sterile fashion. Peritoneal access was gained using a Visiport technique in the left upper quadrant and insufflation to 15 mmHg ensued without issue. Gross inspection revealed no evidence of injury. Under direct visualization the remaining trochars were inserted. A laparoscopic assisted bilateral taps block was performed using Exparel mixed with Marcaine.   The omentum was reflected cephalad and the ligament of Treitz identified. The small bowel was followed to a point 40 cm distal to ligament of Treitz at which location the bowel was divided with a white load linear cutting stapler. A Penrose was sutured to the Roux side of the staple line for future identification. The bowel was measured another 100 cm distal to this and and the site for the jejunojejunostomy was aligned with the end of the biliopancreatic limb. Enterotomies were made with the Harmonic scalpel and the anastomosis was created with the 60 mm white load linear cutting stapler. The common enterotomy was closed with running 3-0 Vicryl starting on either end and tying centrally. The mesenteric defect was closed with running silk suture secured  with Lapra-Ty's. The anastomosis was inspected and appeared widely patent, hemostatic with no gaps in the suture line. Vistaseal was injected over the anastomosis. We then divided the omentum using the harmonic scalpel.   The patient was then placed in steep reverse Trendelenburg. The liver retractor was inserted through a subxiphoid incision and secured for fixed retraction of the left lobe. The Harmonic scalpel was used to enter the perigastric plane and the lesser sac at a point 5 cm distal to the GE junction on the lesser curve. The angle of His was gently bluntly dissected and the target shape of the pouch visualized to exclude any residual fundus. After confirming that all tubes have been removed from the stomach, the gastric pouch was created with serial fires of the linear cutting stapler. As we approached the angle of His, the Ewald tube was inserted to confirm no impingement on the GE junction. The Roux limb with its attached Penrose drain was then identified and brought up to meet the gastric pouch ensuring no twist in the small bowel mesentery. The staple line of the small bowel is directed to the patient's left side. A running 3-0 Vicryl was used to create a posterior suture line for our anastomosis between the gastric pouch and the small bowel. Gastrotomy and enterotomy was made with the Harmonic scalpel and a blue load linear cutting stapler was used to create a gastrojejunal anastomosis approximately 2.5cm wide. The common enterotomy was closed with running 3-0 Vicryl starting at either end and tying centrally. At this juncture the Ewald tube was passed through the gastrojejunal anastomosis. An anterior layer of running 3-0 Vicryl was used to complete the gastrojejunal anastomosis. The wall tube was removed without difficulty.  The Galena space was closed with a figure-of-eight silk suture.   At this point the assistant performed an upper endoscopy with the Roux limb gently clamped with a bowel  clamp. Irrigation is instilled in the upper abdomen for a leak test. Please see his separate operative note- the anastomosis is noted to be patent and hemostatic without any leak or bubbles present. The pouch is 5cm in length. The endoscope was removed and the abdomen once again surveyed. Additional vistaseal is sprayed over the gastrojejunostomy. There was identified a 1.5cm superficial tear to the underside of the very lateral edge of the left lobe of the liver, which was treated with cautery and surgicel snow. The liver retractor was removed under direct visualization. The abdomen was then desufflated and all remaining trochars removed. The skin incisions were closed with subcuticular Monocryl; benzoin, Steri-Strips and Band-Aids were applied The patient was then awakened, extubated and taken to PACU in stable condition.     All counts were correct at the completion of the case.

## 2020-01-28 NOTE — Anesthesia Postprocedure Evaluation (Signed)
Anesthesia Post Note  Patient: Jackalynn Art  Procedure(s) Performed: LAPAROSCOPIC ROUX-EN-Y GASTRIC BYPASS WITH UPPER ENDOSCOPY (N/A Abdomen)     Patient location during evaluation: PACU Anesthesia Type: General Level of consciousness: awake and alert and oriented Pain management: pain level controlled Vital Signs Assessment: post-procedure vital signs reviewed and stable Respiratory status: spontaneous breathing, nonlabored ventilation and respiratory function stable Cardiovascular status: blood pressure returned to baseline and stable Postop Assessment: no apparent nausea or vomiting Anesthetic complications: no   No complications documented.  Last Vitals:  Vitals:   01/28/20 1430 01/28/20 1456  BP: (!) 170/100 (!) 144/88  Pulse: 78 85  Resp: 15 20  Temp: 36.7 C 36.7 C  SpO2: 93% 94%    Last Pain:  Vitals:   01/28/20 1456  TempSrc: Oral  PainSc:                  Demitris Pokorny A.

## 2020-01-28 NOTE — Interval H&P Note (Signed)
History and Physical Interval Note:  01/28/2020 9:59 AM  Laura Combs  has presented today for surgery, with the diagnosis of morbid obesity.  The various methods of treatment have been discussed with the patient and family. After consideration of risks, benefits and other options for treatment, the patient has consented to  Procedure(s): LAPAROSCOPIC ROUX-EN-Y GASTRIC BYPASS WITH UPPER ENDOSCOPY (N/A) as a surgical intervention.  The patient's history has been reviewed, patient examined, no change in status, stable for surgery.  I have reviewed the patient's chart and labs.  Questions were answered to the patient's satisfaction.     Sherlock Nancarrow Lollie Sails

## 2020-01-29 ENCOUNTER — Encounter (HOSPITAL_COMMUNITY): Payer: Self-pay | Admitting: Surgery

## 2020-01-29 LAB — COMPREHENSIVE METABOLIC PANEL
ALT: 18 U/L (ref 0–44)
AST: 46 U/L — ABNORMAL HIGH (ref 15–41)
Albumin: 3.1 g/dL — ABNORMAL LOW (ref 3.5–5.0)
Alkaline Phosphatase: 53 U/L (ref 38–126)
Anion gap: 11 (ref 5–15)
BUN: 7 mg/dL (ref 6–20)
CO2: 20 mmol/L — ABNORMAL LOW (ref 22–32)
Calcium: 8.5 mg/dL — ABNORMAL LOW (ref 8.9–10.3)
Chloride: 107 mmol/L (ref 98–111)
Creatinine, Ser: <0.3 mg/dL — ABNORMAL LOW (ref 0.44–1.00)
Glucose, Bld: 119 mg/dL — ABNORMAL HIGH (ref 70–99)
Potassium: 4.2 mmol/L (ref 3.5–5.1)
Sodium: 138 mmol/L (ref 135–145)
Total Bilirubin: <0.1 mg/dL — ABNORMAL LOW (ref 0.3–1.2)
Total Protein: 4.9 g/dL — ABNORMAL LOW (ref 6.5–8.1)

## 2020-01-29 LAB — CBC WITH DIFFERENTIAL/PLATELET
Abs Immature Granulocytes: 0.09 10*3/uL — ABNORMAL HIGH (ref 0.00–0.07)
Basophils Absolute: 0 10*3/uL (ref 0.0–0.1)
Basophils Relative: 0 %
Eosinophils Absolute: 0 10*3/uL (ref 0.0–0.5)
Eosinophils Relative: 0 %
HCT: 39 % (ref 36.0–46.0)
Hemoglobin: 13 g/dL (ref 12.0–15.0)
Immature Granulocytes: 1 %
Lymphocytes Relative: 15 %
Lymphs Abs: 1.8 10*3/uL (ref 0.7–4.0)
MCH: 29.9 pg (ref 26.0–34.0)
MCHC: 33.3 g/dL (ref 30.0–36.0)
MCV: 89.7 fL (ref 80.0–100.0)
Monocytes Absolute: 0.7 10*3/uL (ref 0.1–1.0)
Monocytes Relative: 6 %
Neutro Abs: 9.5 10*3/uL — ABNORMAL HIGH (ref 1.7–7.7)
Neutrophils Relative %: 78 %
Platelets: 175 10*3/uL (ref 150–400)
RBC: 4.35 MIL/uL (ref 3.87–5.11)
RDW: 12.5 % (ref 11.5–15.5)
WBC: 12.1 10*3/uL — ABNORMAL HIGH (ref 4.0–10.5)
nRBC: 0 % (ref 0.0–0.2)

## 2020-01-29 LAB — MAGNESIUM: Magnesium: 1.6 mg/dL — ABNORMAL LOW (ref 1.7–2.4)

## 2020-01-29 MED ORDER — MAGNESIUM SULFATE 4 GM/100ML IV SOLN
4.0000 g | Freq: Once | INTRAVENOUS | Status: AC
  Administered 2020-01-29: 06:00:00 4 g via INTRAVENOUS
  Filled 2020-01-29: qty 100

## 2020-01-29 MED ORDER — DOCUSATE SODIUM 100 MG PO CAPS
100.0000 mg | ORAL_CAPSULE | Freq: Two times a day (BID) | ORAL | 0 refills | Status: AC
Start: 2020-01-29 — End: 2020-02-28

## 2020-01-29 MED ORDER — PANTOPRAZOLE SODIUM 40 MG PO TBEC
40.0000 mg | DELAYED_RELEASE_TABLET | Freq: Every day | ORAL | 0 refills | Status: DC
Start: 2020-01-29 — End: 2020-04-28

## 2020-01-29 MED ORDER — ACETAMINOPHEN 500 MG PO TABS: 1000.0000 mg | ORAL_TABLET | Freq: Three times a day (TID) | ORAL | 0 refills | Status: AC

## 2020-01-29 MED ORDER — GABAPENTIN 100 MG PO CAPS: 200.0000 mg | ORAL_CAPSULE | Freq: Two times a day (BID) | ORAL | 0 refills | Status: DC

## 2020-01-29 MED ORDER — ONDANSETRON 4 MG PO TBDP: 4.0000 mg | ORAL_TABLET | Freq: Four times a day (QID) | ORAL | 0 refills | Status: DC | PRN

## 2020-01-29 MED ORDER — TRAMADOL HCL 50 MG PO TABS
50.0000 mg | ORAL_TABLET | Freq: Four times a day (QID) | ORAL | 0 refills | Status: DC | PRN
Start: 2020-01-29 — End: 2020-04-28

## 2020-01-29 NOTE — Progress Notes (Signed)
S: Uneventful night, denies nausea, dysphagia, or epigastric pain this morning.  She does have some pressure in the upper abdomen.  She has walked the halls last evening.  Vitals, labs, intake/output, and orders reviewed at this time.  No fever or tachycardia.  P.o. intake 300 cc, urine output 2100.  CMP unremarkable, magnesium low at 1.6, hemoglobin stable at 13.0 (13.9 preop), white count 12.1 (7.6 preop)  Gen: A&Ox3, no distress  Chest: unlabored respirations, RRR Abd: soft, nontender, nondistended.  Incisions clean dry intact without cellulitis or hematoma. Ext: warm, no edema Neuro: grossly normal  Lines/tubes/drains: PIV  A/P: Postop day 1 status post laparoscopic Roux-en-Y gastric bypass, doing well. Continue clear liquids and protein shakes Continue pulmonary toilet, ambulation, prophylactic Lovenox  Plan discharge later today if doing well   Phylliss Blakes, MD [facility] Surgery, [place] Pager [phone]

## 2020-01-29 NOTE — Discharge Summary (Signed)
Physician Discharge Summary  Laura Combs BSW:[id number] DOB: 20-Jan-1981 DOA: 01/28/2020  PCP: Bennie Pierini, FNP  Admit date: 01/28/2020 Discharge date: 01/29/2020   Recommendations for Outpatient Follow-up:     Follow-up Information     Berna Bue, MD. Go on 02/27/2020.   Specialty: General Surgery Why: Appointment time 950 am.  Please arrive 15 minutes prior to appointment time.  Thank you. Contact information: [address] [place] [address] [phone]         Surgery, [place]. Go on 03/25/2020.   Specialty: General Surgery Why: Appointment with Dr Phylliss Blakes at 920 am.  Please arrive 15 minutes prior to your appointment time.  Thank you Contact information: [address] [place] [address] [phone]                Discharge Diagnoses:  Active Problems:   Morbid obesity (HCC)   Surgical Procedure: Laparoscopic Roux-en-Y gastric bypass, upper endoscopy  Discharge Condition: Good Disposition: Home  Diet recommendation: Postoperative gastric bypass diet  Filed Weights   01/28/20 0932 01/28/20 0956  Weight: (!) 127 kg (!) 127 kg     Hospital Course:  The patient was admitted for a planned laparoscopic Roux-en-Y gastric bypass. Please see operative note. Preoperatively the patient was given 5000 units of subcutaneous heparin for DVT prophylaxis. ERAS protocol was used. Postoperative prophylactic Lovenox dosing was started on the morning of postoperative day 1.  The patient was started on ice chips and water on the evening of POD 0 which they tolerated. On postoperative day 1 The patient's diet was advanced to protein shakes which they also tolerated. The patient was ambulating without difficulty. Their vital signs are stable without fever or tachycardia. Their hemoglobin had remained stable. The patient had received discharge instructions and counseling. They were deemed stable for  discharge.  BP 115/72 (BP Location: Right Arm)   Pulse 80   Temp 97.8 F (36.6 C)   Resp 15   Ht 5\' 2"  (1.575 m)   Wt (!) 127 kg   SpO2 97%   BMI 51.21 kg/m   Gen: alert, NAD, non-toxic appearing Pupils: equal, no scleral icterus Pulm: Lungs clear to auscultation, symmetric chest rise CV: regular rate and rhythm Abd: soft, min tender, nondistended. No cellulitis. No incisional hernia Ext: no edema, no calf tenderness Skin: no rash, no jaundice  Discharge Instructions  Discharge Instructions     Call MD for:  difficulty breathing, headache or visual disturbances   Complete by: As directed    Call MD for:  persistant dizziness or light-headedness   Complete by: As directed    Call MD for:  persistant nausea and vomiting   Complete by: As directed    Call MD for:  redness, tenderness, or signs of infection (pain, swelling, redness, odor or green/yellow discharge around incision site)   Complete by: As directed    Call MD for:  severe uncontrolled pain   Complete by: As directed    Call MD for:  temperature >101 F   Complete by: As directed       Allergies as of 01/29/2020   No Known Allergies      Medication List     STOP taking these medications    etonogestrel-ethinyl estradiol 0.12-0.015 MG/24HR vaginal ring Commonly known as: NUVARING   lisinopril 20 MG tablet Commonly known as: ZESTRIL       TAKE these medications    cetirizine 10 MG tablet  Commonly known as: ZYRTEC Take 10 mg by mouth daily.   docusate sodium 100 MG capsule Commonly known as: Colace Take 1 capsule (100 mg total) by mouth 2 (two) times daily. Okay to decrease to once daily or stop taking if having loose bowel movements   eletriptan 40 MG tablet Commonly known as: RELPAX TAKE 1 TABLET AT ONSET OF HEADACHE, MAY REPEAT ONCE IN 2 HOURS What changed:  how much to take how to take this when to take this reasons to take this additional instructions   escitalopram 20 MG  tablet Commonly known as: LEXAPRO TAKE 1 TABLET DAILY   gabapentin 100 MG capsule Commonly known as: NEURONTIN Take 2 capsules (200 mg total) by mouth every 12 (twelve) hours.   hydrOXYzine 10 MG tablet Commonly known as: ATARAX/VISTARIL TAKE (1) TABLET THREE TIMES DAILY AS NEEDED. What changed: See the new instructions.   multivitamin tablet Take 1 tablet by mouth daily.   ondansetron 4 MG disintegrating tablet Commonly known as: ZOFRAN-ODT Take 1 tablet (4 mg total) by mouth every 6 (six) hours as needed for nausea or vomiting.   pantoprazole 40 MG tablet Commonly known as: PROTONIX Take 1 tablet (40 mg total) by mouth daily.   traMADol 50 MG tablet Commonly known as: ULTRAM Take 1 tablet (50 mg total) by mouth every 6 (six) hours as needed (pain).       ASK your doctor about these medications    acetaminophen 500 MG tablet Commonly known as: TYLENOL Take 2 tablets (1,000 mg total) by mouth every 8 (eight) hours for 5 days. Ask about: Should I take this medication?        Follow-up Information     Berna Bue, MD. Go on 02/27/2020.   Specialty: General Surgery Why: Appointment time 950 am.  Please arrive 15 minutes prior to appointment time.  Thank you. Contact information: [address] [place] [address] [phone]         Surgery, [place]. Go on 03/25/2020.   Specialty: General Surgery Why: Appointment with Dr Phylliss Blakes at 920 am.  Please arrive 15 minutes prior to your appointment time.  Thank you Contact information: [address] [place] [address] [phone]                  The results of significant diagnostics from this hospitalization (including imaging, microbiology, ancillary and laboratory) are listed below for reference.    Significant Diagnostic Studies: No results found.  Labs: Basic Metabolic Panel: No results for input(s): NA, K, CL, CO2, GLUCOSE, BUN,  CREATININE, CALCIUM, MG, PHOS in the last 168 hours.  Liver Function Tests: No results for input(s): AST, ALT, ALKPHOS, BILITOT, PROT, ALBUMIN in the last 168 hours.   CBC: No results for input(s): WBC, NEUTROABS, HGB, HCT, MCV, PLT in the last 168 hours.   CBG: No results for input(s): GLUCAP in the last 168 hours.  Active Problems:   Morbid obesity (HCC)   Signed:  Berna Bue, MD [facility], [place] [phone] 02/05/2020, 7:29 AM

## 2020-01-29 NOTE — Progress Notes (Signed)
Patient alert and oriented, Post op day 1.  Provided support and encouragement.  Encouraged pulmonary toilet, ambulation and small sips of liquids.  Completed 12 ounces bari clear fluid protein started at 715 am.  All questions answered.  Will continue to monitor.

## 2020-01-29 NOTE — Progress Notes (Signed)
Patient alert and oriented, pain is controlled. Patient is tolerating fluids, advanced to protein shake today, patient is tolerating well. Reviewed Gastric Bypass discharge instructions with patient and patient is able to articulate understanding. Provided information on BELT program, Support Group and [facility] outpatient pharmacy. All questions answered, will continue to monitor.   Total fluid intake 720 Per dehydration protocol call back one week post op 

## 2020-02-03 ENCOUNTER — Telehealth (HOSPITAL_COMMUNITY): Payer: Self-pay

## 2020-02-03 NOTE — Telephone Encounter (Signed)
Patient called to discuss post bariatric surgery follow up questions.  See below:   1.  Tell me about your pain and pain management?denies  2.  Let's talk about fluid intake.  How much total fluid are you taking in? Between 40 and 50 ounces  3.  How much protein have you taken in the last 2 days?60 grams of protein  4.  Have you had nausea?  Tell me about when have experienced nausea and what you did to help?denies  5.  Has the frequency or color changed with your urine?no problems  6.  Tell me what your incisions look like?no problems  7.  Have you been passing gas? BM?had bm since discharge, a little constipated yesterday took miralax yesterday  8.  If a problem or question were to arise who would you call?  Do you know contact numbers for [facility], [facility], and [facility]?aware of how to contact all resources  9.  How has the walking going?walking around no issues, been out and about and walking around house  10.  How are your vitamins and calcium going?  How are you taking them?taking mvi and calcium, does not like mvi

## 2020-02-08 ENCOUNTER — Other Ambulatory Visit: Payer: Self-pay | Admitting: Nurse Practitioner

## 2020-02-08 DIAGNOSIS — F339 Major depressive disorder, recurrent, unspecified: Secondary | ICD-10-CM

## 2020-02-08 DIAGNOSIS — L299 Pruritus, unspecified: Secondary | ICD-10-CM

## 2020-02-08 DIAGNOSIS — F419 Anxiety disorder, unspecified: Secondary | ICD-10-CM

## 2020-02-11 ENCOUNTER — Other Ambulatory Visit: Payer: Self-pay

## 2020-02-11 ENCOUNTER — Encounter: Payer: BC Managed Care – PPO | Attending: Surgery | Admitting: Skilled Nursing Facility1

## 2020-02-11 DIAGNOSIS — E669 Obesity, unspecified: Secondary | ICD-10-CM | POA: Insufficient documentation

## 2020-02-11 NOTE — Progress Notes (Signed)
2 Week Post-Operative Nutrition Class   Patient was seen on 08/28/18 for Post-Operative Nutrition education at the [facility].    Pt sates she has been well with no complaints.    Surgery date: 01/28/2020 Surgery type: RYGB Start weight at [facility]: 279 Weight today: 267.3   Body Composition Scale Declined  Total Body Fat %   Visceral Fat   Fat-Free Mass %    Total Body Water %    Muscle-Mass lbs   Body Fat Displacement          Torso  lbs          Left Leg  lbs          Right Leg  lbs          Left Arm  lbs          Right Arm   lbs      The following the learning objectives were met by the patient during this course:  Identifies Phase 3 (Soft, High Proteins) Dietary Goals and will begin from 2 weeks post-operatively to 2 months post-operatively  Identifies appropriate sources of fluids and proteins   States protein recommendations and appropriate sources post-operatively  Identifies the need for appropriate texture modifications, mastication, and bite sizes when consuming solids  Identifies appropriate multivitamin and calcium sources post-operatively  Describes the need for physical activity post-operatively and will follow MD recommendations  States when to call healthcare provider regarding medication questions or post-operative complications   Handouts given during class include:  Phase 3A: Soft, High Protein Diet Handout   Follow-Up Plan: Patient will follow-up at [facility] in 6 weeks for 2 month post-op nutrition visit for diet advancement per MD.

## 2020-02-13 ENCOUNTER — Encounter (HOSPITAL_COMMUNITY): Payer: Self-pay | Admitting: Emergency Medicine

## 2020-02-13 ENCOUNTER — Other Ambulatory Visit: Payer: Self-pay

## 2020-02-13 ENCOUNTER — Emergency Department (HOSPITAL_COMMUNITY): Payer: BC Managed Care – PPO

## 2020-02-13 ENCOUNTER — Emergency Department (HOSPITAL_COMMUNITY)
Admission: EM | Admit: 2020-02-13 | Discharge: 2020-02-13 | Disposition: A | Discharge status: Home / Self Care | Payer: BC Managed Care – PPO | Attending: Emergency Medicine | Admitting: Emergency Medicine

## 2020-02-13 DIAGNOSIS — I10 Essential (primary) hypertension: Secondary | ICD-10-CM | POA: Diagnosis not present

## 2020-02-13 DIAGNOSIS — R109 Unspecified abdominal pain: Secondary | ICD-10-CM | POA: Diagnosis present

## 2020-02-13 DIAGNOSIS — N200 Calculus of kidney: Secondary | ICD-10-CM | POA: Diagnosis not present

## 2020-02-13 DIAGNOSIS — Z79899 Other long term (current) drug therapy: Secondary | ICD-10-CM | POA: Insufficient documentation

## 2020-02-13 LAB — URINALYSIS, ROUTINE W REFLEX MICROSCOPIC
Bilirubin Urine: NEGATIVE
Glucose, UA: NEGATIVE mg/dL
Ketones, ur: 20 mg/dL — AB
Nitrite: NEGATIVE
Protein, ur: 100 mg/dL — AB
RBC / HPF: >50 RBC/hpf — ABNORMAL HIGH (ref 0–5)
Specific Gravity, Urine: 1.028 (ref 1.005–1.030)
pH: 5 (ref 5.0–8.0)

## 2020-02-13 LAB — CBC
HCT: 46.5 % — ABNORMAL HIGH (ref 36.0–46.0)
Hemoglobin: 14.4 g/dL (ref 12.0–15.0)
MCH: 29.4 pg (ref 26.0–34.0)
MCHC: 31 g/dL (ref 30.0–36.0)
MCV: 94.9 fL (ref 80.0–100.0)
Platelets: 258 10*3/uL (ref 150–400)
RBC: 4.9 MIL/uL (ref 3.87–5.11)
RDW: 13.3 % (ref 11.5–15.5)
WBC: 10.7 10*3/uL — ABNORMAL HIGH (ref 4.0–10.5)
nRBC: 0 % (ref 0.0–0.2)

## 2020-02-13 LAB — BASIC METABOLIC PANEL
Anion gap: 11 (ref 5–15)
BUN: 16 mg/dL (ref 6–20)
CO2: 17 mmol/L — ABNORMAL LOW (ref 22–32)
Calcium: 9.2 mg/dL (ref 8.9–10.3)
Chloride: 111 mmol/L (ref 98–111)
Creatinine, Ser: 0.93 mg/dL (ref 0.44–1.00)
GFR calc Af Amer: >60 mL/min (ref >60)
GFR calc non Af Amer: >60 mL/min (ref >60)
Glucose, Bld: 116 mg/dL — ABNORMAL HIGH (ref 70–99)
Potassium: 4 mmol/L (ref 3.5–5.1)
Sodium: 139 mmol/L (ref 135–145)

## 2020-02-13 MED ORDER — OXYCODONE HCL 5 MG PO TABS: 5.0000 mg | ORAL_TABLET | ORAL | 0 refills | Status: DC | PRN

## 2020-02-13 MED ORDER — ONDANSETRON HCL 4 MG PO TABS
4.0000 mg | ORAL_TABLET | Freq: Once | ORAL | Status: AC
  Administered 2020-02-13: 4 mg via ORAL
  Filled 2020-02-13: qty 1

## 2020-02-13 MED ORDER — OXYCODONE HCL 5 MG PO TABS
5.0000 mg | ORAL_TABLET | Freq: Once | ORAL | Status: AC
  Administered 2020-02-13: 5 mg via ORAL
  Filled 2020-02-13: qty 1

## 2020-02-13 MED ORDER — ONDANSETRON HCL 4 MG PO TABS: 4.0000 mg | ORAL_TABLET | Freq: Four times a day (QID) | ORAL | 0 refills | Status: AC

## 2020-02-13 MED ORDER — TAMSULOSIN HCL 0.4 MG PO CAPS: 0.4000 mg | ORAL_CAPSULE | Freq: Every day | ORAL | 0 refills | Status: AC

## 2020-02-13 NOTE — ED Provider Notes (Signed)
[facility]-EMERGENCY DEPT Provider Note   CSN: [id number] Arrival date & time: 02/13/20  1429     History Chief Complaint  Patient presents with  . Flank Pain    Laura Combs is a 39 y.o. female.  The history is provided by the patient.  Flank Pain This is a new problem. The current episode started 6 to 12 hours ago. Episode frequency: waxing and waning. Associated symptoms include abdominal pain (left flank, hx of kidney stones). Pertinent negatives include no chest pain, no headaches and no shortness of breath. Nothing aggravates the symptoms. Nothing relieves the symptoms. She has tried nothing for the symptoms. The treatment provided no relief.       Past Medical History:  Diagnosis Date  . Anxiety   . Depression   . 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 History of kidney stones 2017  . Hypertension   . Pneumonia 05/2019  . Urinary incontinence     Patient Active Problem List   Diagnosis Date Noted  . Morbid obesity (HCC) 01/28/2020  . COVID-19 virus infection 05/22/2019  . Acute respiratory failure due to COVID-19 (HCC) 05/22/2019  . Abnormal liver function 05/22/2019  . Hypertensive disorder 02/08/2017  . GAD (generalized anxiety disorder) 03/02/2016  . Morbid obesity with BMI of 45.0-49.9, adult (HCC) 03/02/2016  . Migraine 09/28/2015    Past Surgical History:  Procedure Laterality Date  . DILATION AND EVACUATION  11/03/2011   Procedure: DILATATION AND EVACUATION;  Surgeon: Levi Aland, MD;  Location: [facility];  Service: Gynecology;  Laterality: N/A;  . GASTRIC ROUX-EN-Y N/A 01/28/2020   Procedure: LAPAROSCOPIC ROUX-EN-Y GASTRIC BYPASS WITH UPPER ENDOSCOPY;  Surgeon: Berna Bue, MD;  Location: [facility];  Service: General;  Laterality: N/A;  . TONSILLECTOMY       OB History    Gravida  3   Para  2   Term  2   Preterm  0   AB  1   Living  2     SAB  1   TAB  0   Ectopic  0   Multiple  0   Live Births  2            Family History  Problem Relation Age of Onset  . Diabetes Father   . Hypertension Father   . Hyperlipidemia Father   . Diabetes Maternal Aunt   . Diabetes Maternal Grandmother   . Diabetes Maternal Grandfather     Social History   Tobacco Use  . Smoking status: Never Smoker  . Smokeless tobacco: Never Used  Vaping Use  . Vaping Use: Never used  Substance Use Topics  . Alcohol use: No  . Drug use: No    Home Medications Prior to Admission medications   Medication Sig Start Date End Date Taking? Authorizing Provider  calcium citrate-vitamin D (CALCIUM CITRATE CHEWY BITE) 500-500 MG-UNIT chewable tablet Chew 1 tablet by mouth 3 (three) times daily.   Yes [provider]  cetirizine (ZYRTEC) 10 MG tablet Take 10 mg by mouth at bedtime.    Yes [provider]  docusate sodium (COLACE) 100 MG capsule Take 1 capsule (100 mg total) by mouth 2 (two) times daily. Okay to decrease to once daily or stop taking if having loose bowel movements 01/29/20 02/28/20 Yes Phylliss Blakes A, MD  eletriptan (RELPAX) 40 MG tablet TAKE 1 TABLET AT ONSET OF HEADACHE, MAY REPEAT ONCE IN 2 HOURS Patient taking differently: Take  40 mg by mouth every 2 (two) hours as needed for migraine.  09/02/19  Yes Martin, Mary-Margaret, FNP  escitalopram (LEXAPRO) 20 MG tablet TAKE 1 TABLET DAILY Patient taking differently: Take 20 mg by mouth at bedtime.  02/10/20  Yes Martin, Mary-Margaret, FNP  gabapentin (NEURONTIN) 100 MG capsule Take 2 capsules (200 mg total) by mouth every 12 (twelve) hours. Patient taking differently: Take 200 mg by mouth 2 (two) times daily as needed (pain).  01/29/20  Yes Berna Bue, MD  hydrOXYzine (ATARAX/VISTARIL) 10 MG tablet TAKE (1) TABLET THREE TIMES DAILY AS NEEDED. Patient taking differently: Take 10 mg by mouth 3 (three) times daily as needed for anxiety.  02/10/20  Yes Martin, Mary-Margaret, FNP  lisinopril (ZESTRIL) 20 MG tablet Take 20 mg by mouth every  evening.   Yes [provider]  Multiple Vitamin (MULTIVITAMIN) tablet Take 1 tablet by mouth daily.   Yes [provider]  ondansetron (ZOFRAN-ODT) 4 MG disintegrating tablet Take 1 tablet (4 mg total) by mouth every 6 (six) hours as needed for nausea or vomiting. 01/29/20  Yes Berna Bue, MD  pantoprazole (PROTONIX) 40 MG tablet Take 1 tablet (40 mg total) by mouth daily. Patient taking differently: Take 40 mg by mouth every evening.  01/29/20  Yes Berna Bue, MD  traMADol (ULTRAM) 50 MG tablet Take 1 tablet (50 mg total) by mouth every 6 (six) hours as needed (pain). 01/29/20  Yes Berna Bue, MD  ondansetron (ZOFRAN) 4 MG tablet Take 1 tablet (4 mg total) by mouth every 6 (six) hours for 20 doses. 02/13/20 02/18/20  Alajiah Dutkiewicz, DO  oxyCODONE (ROXICODONE) 5 MG immediate release tablet Take 1 tablet (5 mg total) by mouth every 4 (four) hours as needed for up to 20 doses for severe pain or breakthrough pain. 02/13/20   Huxley Vanwagoner, DO  tamsulosin (FLOMAX) 0.4 MG CAPS capsule Take 1 capsule (0.4 mg total) by mouth daily for 10 days. 02/13/20 02/23/20  Virgina Norfolk, DO    Allergies    Patient has no known allergies.  Review of Systems   Review of Systems  Constitutional: Negative for chills and fever.  HENT: Negative for ear pain and sore throat.   Eyes: Negative for pain and visual disturbance.  Respiratory: Negative for cough and shortness of breath.   Cardiovascular: Negative for chest pain and palpitations.  Gastrointestinal: Positive for abdominal pain (left flank, hx of kidney stones). Negative for vomiting.  Genitourinary: Positive for flank pain. Negative for dysuria and hematuria.  Musculoskeletal: Negative for arthralgias and back pain.  Skin: Negative for color change and rash.  Neurological: Negative for seizures, syncope and headaches.  All other systems reviewed and are negative.   Physical Exam Updated Vital Signs BP 127/82 (BP  Location: Left Arm)   Pulse 83   Temp 98.2 F (36.8 C)   Resp 18   Ht 5\' 2"  (1.575 m)   Wt 120.2 kg   LMP 01/25/2020   SpO2 100%   BMI 48.47 kg/m   Physical Exam Vitals and nursing note reviewed.  Constitutional:      General: She is not in acute distress.    Appearance: She is well-developed. She is not ill-appearing.  HENT:     Head: Normocephalic and atraumatic.  Eyes:     Conjunctiva/sclera: Conjunctivae normal.  Cardiovascular:     Rate and Rhythm: Normal rate and regular rhythm.     Pulses: Normal pulses.     Heart  sounds: Normal heart sounds. No murmur heard.   Pulmonary:     Effort: Pulmonary effort is normal. No respiratory distress.     Breath sounds: Normal breath sounds.  Abdominal:     Palpations: Abdomen is soft.     Tenderness: There is no abdominal tenderness. There is left CVA tenderness.  Musculoskeletal:     Cervical back: Normal range of motion and neck supple.  Skin:    General: Skin is warm and dry.     Capillary Refill: Capillary refill takes less than 2 seconds.  Neurological:     General: No focal deficit present.     Mental Status: She is alert.     ED Results / Procedures / Treatments   Labs (all labs ordered are listed, but only abnormal results are displayed) Labs Reviewed  CBC - Abnormal; Notable for the following components:      Result Value   WBC 10.7 (*)    HCT 46.5 (*)    All other components within normal limits  BASIC METABOLIC PANEL - Abnormal; Notable for the following components:   CO2 17 (*)    Glucose, Bld 116 (*)    All other components within normal limits  URINALYSIS, ROUTINE W REFLEX MICROSCOPIC - Abnormal; Notable for the following components:   APPearance HAZY (*)    Hgb urine dipstick LARGE (*)    Ketones, ur 20 (*)    Protein, ur 100 (*)    Leukocytes,Ua MODERATE (*)    RBC / HPF >50 (*)    Bacteria, UA FEW (*)    All other components within normal limits    EKG None  Radiology CT Renal Stone  Study  Result Date: 02/13/2020 CLINICAL DATA:  Left flank pain EXAM: CT ABDOMEN AND PELVIS WITHOUT CONTRAST TECHNIQUE: Multidetector CT imaging of the abdomen and pelvis was performed following the standard protocol without IV contrast. COMPARISON:  None. FINDINGS: Lower chest: Lung bases are clear. No effusions. Heart is normal size. Hepatobiliary: No focal hepatic abnormality. Gallbladder unremarkable. Pancreas: No focal abnormality or ductal dilatation. Spleen: No focal abnormality.  Normal size. Adrenals/Urinary Tract: Bilateral nephrolithiasis. Mild left hydronephrosis due to 2-3 mm mid left ureteral stone. Adrenal glands and urinary bladder unremarkable. Stomach/Bowel: Prior gastric bypass. No complicating features. No bowel obstruction. Normal appendix. Vascular/Lymphatic: No evidence of aneurysm or adenopathy. Reproductive: Uterus and adnexa unremarkable.  No mass. Other: No free fluid or free air. Musculoskeletal: No acute bony abnormality. IMPRESSION: Bilateral nephrolithiasis. 3 mm mid left ureteral stone with mild left hydronephrosis. Electronically Signed   By: Charlett Nose M.D.   On: 02/13/2020 19:13    Procedures Procedures (including critical care time)  Medications Ordered in ED Medications  oxyCODONE (Oxy IR/ROXICODONE) immediate release tablet 5 mg (5 mg Oral Given 02/13/20 2300)  ondansetron (ZOFRAN) tablet 4 mg (4 mg Oral Given 02/13/20 2300)    ED Course  I have reviewed the triage vital signs and the nursing notes.  Pertinent labs & imaging results that were available during my care of the patient were reviewed by me and considered in my medical decision making (see chart for details).    MDM Rules/Calculators/A&P                          Laura Combs is a 39 year old female history of kidney stones who presents to the ED with left flank pain.  Normal vitals.  No fever.  3 mm left ureteral stone  seen on CT scan that was done prior to my evaluation.  Urinalysis without  signs of infection.  There was some hematuria.  No significant leukocytosis, anemia, electrolyte abnormality otherwise.  Pain was well controlled upon my evaluation therefore she was treated with p.o. oxycodone and Zofran.  Overall patient feels better after pain medicine will have her follow-up with urology discharged from ED in good condition.  She understands return precautions.  This chart was dictated using voice recognition software.  Despite best efforts to proofread,  errors can occur which can change the documentation meaning.    Final Clinical Impression(s) / ED Diagnoses Final diagnoses:  Kidney stone    Rx / DC Orders ED Discharge Orders         Ordered    tamsulosin (FLOMAX) 0.4 MG CAPS capsule  Daily     Discontinue  Reprint     02/13/20 2318    oxyCODONE (ROXICODONE) 5 MG immediate release tablet  Every 4 hours PRN     Discontinue  Reprint     02/13/20 2318    ondansetron (ZOFRAN) 4 MG tablet  Every 6 hours     Discontinue  Reprint     02/13/20 2318           Virgina Norfolkuratolo, Ziyon Soltau, DO 02/13/20 2325

## 2020-02-13 NOTE — ED Triage Notes (Signed)
Pt reports c/o left flank pain radiating to the side starting at 11 am. Hx of kidney stones.

## 2020-02-17 ENCOUNTER — Telehealth: Payer: Self-pay | Admitting: Skilled Nursing Facility1

## 2020-02-17 NOTE — Telephone Encounter (Signed)
RD called pt to verify fluid intake once starting soft, solid proteins 2 week post-bariatric surgery.   Daily Fluid intake: 50-60 oz Daily Protein intake: 60+  Concerns/issues:   Pt states she went to the ED for kidney stone  Advised:   2 ounces lemon juice 2 times a day  70+ ounces fluid per day every day Calcium citrate for calcium supplement

## 2020-02-29 ENCOUNTER — Other Ambulatory Visit: Payer: Self-pay | Admitting: Nurse Practitioner

## 2020-02-29 DIAGNOSIS — L299 Pruritus, unspecified: Secondary | ICD-10-CM

## 2020-03-11 ENCOUNTER — Other Ambulatory Visit: Payer: Self-pay | Admitting: Nurse Practitioner

## 2020-03-11 DIAGNOSIS — F339 Major depressive disorder, recurrent, unspecified: Secondary | ICD-10-CM

## 2020-03-11 DIAGNOSIS — F419 Anxiety disorder, unspecified: Secondary | ICD-10-CM

## 2020-03-13 ENCOUNTER — Ambulatory Visit (INDEPENDENT_AMBULATORY_CARE_PROVIDER_SITE_OTHER): Payer: BC Managed Care – PPO | Admitting: Nurse Practitioner

## 2020-03-13 DIAGNOSIS — N3000 Acute cystitis without hematuria: Secondary | ICD-10-CM

## 2020-03-13 MED ORDER — NITROFURANTOIN MONOHYD MACRO 100 MG PO CAPS
100.0000 mg | ORAL_CAPSULE | Freq: Two times a day (BID) | ORAL | 0 refills | Status: DC
Start: 2020-03-13 — End: 2020-04-28

## 2020-03-13 NOTE — Progress Notes (Signed)
° °  Virtual Visit via telephone Note Due to COVID-19 pandemic this visit was conducted virtually. This visit type was conducted due to national recommendations for restrictions regarding the COVID-19 Pandemic (e.g. social distancing, sheltering in place) in an effort to limit this patient's exposure and mitigate transmission in our community. All issues noted in this document were discussed and addressed.  A physical exam was not performed with this format.  I connected with Laura Combs on 03/13/20 at 4:15 by telephone and verified that I am speaking with the correct person using two identifiers. Laura Combs is currently located at home and nonone is currently with  her during visit. The provider, Mary-Margaret Daphine Deutscher, FNP is located in their office at time of visit.  I discussed the limitations, risks, security and privacy concerns of performing an evaluation and management service by telephone and the availability of in person appointments. I also discussed with the patient that there may be a patient responsible charge related to this service. The patient expressed understanding and agreed to proceed.   History and Present Illness:   Chief Complaint: Urinary Tract Infection   HPI Patient calls in c/o urinary freq and urgency. Started several days ago and has worsened.   Review of Systems  Constitutional: Negative for chills and fever.  Genitourinary: Positive for dysuria, frequency and urgency.  Musculoskeletal: Negative for back pain.  All other systems reviewed and are negative.    Observations/Objective: Alert and oriented- answers all questions appropriately No distress    Assessment and Plan: Laura Combs in today with chief complaint of Urinary Tract Infection   1. Acute cystitis without hematuria Take medication as prescribe Cotton underwear Take shower not bath Cranberry juice, yogurt Force fluids AZO over the counter X2 days RTO prn   Meds ordered  this encounter  Medications   nitrofurantoin, macrocrystal-monohydrate, (MACROBID) 100 MG capsule    Sig: Take 1 capsule (100 mg total) by mouth 2 (two) times daily. 1 po BId    Dispense:  14 capsule    Refill:  0    Order Specific Question:   Supervising Provider    Answer:   Arville Care A [[id number]]      Follow Up Instructions: prn    I discussed the assessment and treatment plan with the patient. The patient was provided an opportunity to ask questions and all were answered. The patient agreed with the plan and demonstrated an understanding of the instructions.   The patient was advised to call back or seek an in-person evaluation if the symptoms worsen or if the condition fails to improve as anticipated.  The above assessment and management plan was discussed with the patient. The patient verbalized understanding of and has agreed to the management plan. Patient is aware to call the clinic if symptoms persist or worsen. Patient is aware when to return to the clinic for a follow-up visit. Patient educated on when it is appropriate to go to the emergency department.   Time call ended:  4:30  I provided 15 minutes of non-face-to-face time during this encounter.    Mary-Margaret Daphine Deutscher, FNP

## 2020-03-16 ENCOUNTER — Encounter: Payer: Self-pay | Admitting: Nurse Practitioner

## 2020-03-16 ENCOUNTER — Other Ambulatory Visit: Payer: Self-pay | Admitting: Nurse Practitioner

## 2020-03-16 MED ORDER — CEPHALEXIN 500 MG PO CAPS: 500.0000 mg | ORAL_CAPSULE | Freq: Two times a day (BID) | ORAL | 0 refills | Status: DC

## 2020-03-25 ENCOUNTER — Other Ambulatory Visit: Payer: Self-pay | Admitting: Nurse Practitioner

## 2020-03-25 ENCOUNTER — Ambulatory Visit: Payer: BC Managed Care – PPO | Admitting: Skilled Nursing Facility1

## 2020-03-25 DIAGNOSIS — G43109 Migraine with aura, not intractable, without status migrainosus: Secondary | ICD-10-CM

## 2020-04-20 ENCOUNTER — Ambulatory Visit: Payer: BC Managed Care – PPO | Admitting: Skilled Nursing Facility1

## 2020-04-22 ENCOUNTER — Other Ambulatory Visit: Payer: Self-pay | Admitting: Nurse Practitioner

## 2020-04-22 DIAGNOSIS — F419 Anxiety disorder, unspecified: Secondary | ICD-10-CM

## 2020-04-22 DIAGNOSIS — L299 Pruritus, unspecified: Secondary | ICD-10-CM

## 2020-04-22 DIAGNOSIS — F339 Major depressive disorder, recurrent, unspecified: Secondary | ICD-10-CM

## 2020-04-23 NOTE — Telephone Encounter (Signed)
MMM NTBS 30 days given 03/12/20

## 2020-04-23 NOTE — Telephone Encounter (Signed)
Left message for patient to call back to schedule an appointment for medication refill.  

## 2020-04-28 ENCOUNTER — Ambulatory Visit: Payer: BC Managed Care – PPO | Admitting: Nurse Practitioner

## 2020-04-28 ENCOUNTER — Other Ambulatory Visit: Payer: Self-pay

## 2020-04-28 ENCOUNTER — Encounter: Payer: Self-pay | Admitting: Nurse Practitioner

## 2020-04-28 VITALS — BP 128/91 | HR 75 | Temp 96.8°F | Resp 20 | Ht 62.0 in | Wt 235.0 lb

## 2020-04-28 DIAGNOSIS — G43109 Migraine with aura, not intractable, without status migrainosus: Secondary | ICD-10-CM

## 2020-04-28 DIAGNOSIS — Z23 Encounter for immunization: Secondary | ICD-10-CM | POA: Diagnosis not present

## 2020-04-28 DIAGNOSIS — L299 Pruritus, unspecified: Secondary | ICD-10-CM

## 2020-04-28 DIAGNOSIS — I10 Essential (primary) hypertension: Secondary | ICD-10-CM | POA: Diagnosis not present

## 2020-04-28 DIAGNOSIS — F411 Generalized anxiety disorder: Secondary | ICD-10-CM

## 2020-04-28 DIAGNOSIS — K219 Gastro-esophageal reflux disease without esophagitis: Secondary | ICD-10-CM

## 2020-04-28 LAB — HM PAP SMEAR: HM Pap smear: 9222021

## 2020-04-28 MED ORDER — PANTOPRAZOLE SODIUM 40 MG PO TBEC: 40.0000 mg | DELAYED_RELEASE_TABLET | Freq: Every day | ORAL | 0 refills | Status: DC

## 2020-04-28 MED ORDER — LISINOPRIL 20 MG PO TABS: 20.0000 mg | ORAL_TABLET | Freq: Every day | ORAL | 1 refills | Status: DC

## 2020-04-28 MED ORDER — HYDROXYZINE HCL 10 MG PO TABS: ORAL_TABLET | ORAL | 1 refills | Status: DC

## 2020-04-28 MED ORDER — ELETRIPTAN HYDROBROMIDE 40 MG PO TABS: ORAL_TABLET | ORAL | 0 refills | Status: DC

## 2020-04-28 MED ORDER — ESCITALOPRAM OXALATE 20 MG PO TABS: 20.0000 mg | ORAL_TABLET | Freq: Every day | ORAL | 1 refills | Status: DC

## 2020-04-28 NOTE — Addendum Note (Signed)
Addended by: Bennie Pierini on: 04/28/2020 04:46 PM   Modules accepted: Orders

## 2020-04-28 NOTE — Patient Instructions (Signed)

## 2020-04-28 NOTE — Progress Notes (Signed)
Subjective:    Patient ID: Laura Combs, female    DOB: 03-10-1981, 39 y.o.   MRN: [id number]  .  Chief Complaint: Medical Management of Chronic Issues    HPI:  1. Primary hypertension No c/o chest pain, sob or headache. Does not check at home. BP Readings from Last 3 Encounters:  02/13/20 127/82  01/29/20 115/72  01/24/20 (!) 137/87     2. GAD (generalized anxiety disorder) Doing well. She has been out of lexapro for about 1 week and is starting to notice that she needs them. GAD 7 : Generalized Anxiety Score 04/28/2020 09/02/2019 12/31/2018  Nervous, Anxious, on Edge 0 0 3  Control/stop worrying 0 0 2  Worry too much - different things 0 0 0  Trouble relaxing 0 - 0  Restless 0 3 0  Easily annoyed or irritable 0 2 3  Afraid - awful might happen 0 0 0  Total GAD 7 Score 0 - 8  Anxiety Difficulty Not difficult at all Not difficult at all Somewhat difficult      3. GERD Is on protonix daily and is doing well  4  Morbid obesity (HCC) Weight is done 45 lbs.    Outpatient Encounter Medications as of 04/28/2020  Medication Sig   calcium citrate-vitamin D (CALCIUM CITRATE CHEWY BITE) 500-500 MG-UNIT chewable tablet Chew 1 tablet by mouth 3 (three) times daily.   cephALEXin (KEFLEX) 500 MG capsule Take 1 capsule (500 mg total) by mouth 2 (two) times daily.   cetirizine (ZYRTEC) 10 MG tablet Take 10 mg by mouth at bedtime.    eletriptan (RELPAX) 40 MG tablet TAKE 1 TABLET AT ONSET OF HEADACHE, MAY REPEAT ONCE IN 2 HOURS   escitalopram (LEXAPRO) 20 MG tablet Take 1 tablet (20 mg total) by mouth daily. (Needs to be seen before next refill)   gabapentin (NEURONTIN) 100 MG capsule Take 2 capsules (200 mg total) by mouth every 12 (twelve) hours. (Patient taking differently: Take 200 mg by mouth 2 (two) times daily as needed (pain). )   hydrOXYzine (ATARAX/VISTARIL) 10 MG tablet TAKE (1) TABLET THREE TIMES DAILY AS NEEDED.   lisinopril (ZESTRIL) 20 MG tablet TAKE 1  TABLET DAILY   Multiple Vitamin (MULTIVITAMIN) tablet Take 1 tablet by mouth daily.   nitrofurantoin, macrocrystal-monohydrate, (MACROBID) 100 MG capsule Take 1 capsule (100 mg total) by mouth 2 (two) times daily. 1 po BId   ondansetron (ZOFRAN-ODT) 4 MG disintegrating tablet Take 1 tablet (4 mg total) by mouth every 6 (six) hours as needed for nausea or vomiting.   oxyCODONE (ROXICODONE) 5 MG immediate release tablet Take 1 tablet (5 mg total) by mouth every 4 (four) hours as needed for up to 20 doses for severe pain or breakthrough pain.   pantoprazole (PROTONIX) 40 MG tablet Take 1 tablet (40 mg total) by mouth daily. (Patient taking differently: Take 40 mg by mouth every evening. )   traMADol (ULTRAM) 50 MG tablet Take 1 tablet (50 mg total) by mouth every 6 (six) hours as needed (pain).     Past Surgical History:  Procedure Laterality Date   DILATION AND EVACUATION  11/03/2011   Procedure: DILATATION AND EVACUATION;  Surgeon: Levi Aland, MD;  Location: [facility];  Service: Gynecology;  Laterality: N/A;   GASTRIC ROUX-EN-Y N/A 01/28/2020   Procedure: LAPAROSCOPIC ROUX-EN-Y GASTRIC BYPASS WITH UPPER ENDOSCOPY;  Surgeon: Berna Bue, MD;  Location: [facility];  Service: General;  Laterality: N/A;   TONSILLECTOMY  Family History  Problem Relation Age of Onset   Diabetes Father    Hypertension Father    Hyperlipidemia Father    Diabetes Maternal Aunt    Diabetes Maternal Grandmother    Diabetes Maternal Grandfather     New complaints: Had gastric bypass surgery about 3 months ago and has lost 45lbs since surgery.  Social history: loives with her husband and is a Runner, broadcasting/film/video at [employer]  Controlled substance contract: n/a    Review of Systems  Constitutional: Negative for diaphoresis.  Eyes: Negative for pain.  Respiratory: Negative for shortness of breath.   Cardiovascular: Negative for chest pain, palpitations and leg swelling.  Gastrointestinal: Negative  for abdominal pain.  Endocrine: Negative for polydipsia.  Skin: Negative for rash.  Neurological: Negative for dizziness, weakness and headaches.  Hematological: Does not bruise/bleed easily.  All other systems reviewed and are negative.      Objective:   Physical Exam Vitals and nursing note reviewed.  Constitutional:      Appearance: Normal appearance.  Cardiovascular:     Rate and Rhythm: Normal rate and regular rhythm.     Heart sounds: Normal heart sounds.  Pulmonary:     Effort: Pulmonary effort is normal.     Breath sounds: Normal breath sounds.  Skin:    General: Skin is warm.  Neurological:     General: No focal deficit present.     Mental Status: She is alert and oriented to person, place, and time.  Psychiatric:        Mood and Affect: Mood normal.        Behavior: Behavior normal.    BP (!) 128/91    Pulse 75    Temp (!) 96.8 F (36 C) (Temporal)    Resp 20    Ht 5\' 2"  (1.575 m)    Wt 235 lb (106.6 kg)    SpO2 97%    BMI 42.98 kg/m        Assessment & Plan:  Laura Combs comes in today with chief complaint of Medical Management of Chronic Issues   Diagnosis and orders addressed:  1. Primary hypertension Low sodium diet - lisinopril (ZESTRIL) 20 MG tablet; Take 1 tablet (20 mg total) by mouth daily.  Dispense: 90 tablet; Refill: 1  2. GAD (generalized anxiety disorder) Stress management - escitalopram (LEXAPRO) 20 MG tablet; Take 1 tablet (20 mg total) by mouth daily. (Needs to be seen before next refill)  Dispense: 90 tablet; Refill: 1  3. Morbid obesity (HCC) Discussed diet and exercise for person with BMI >25 Will recheck weight in 3-6 months Keep follow up with weight loss clinic  4. Migraine with aura and without status migrainosus, not intractable - eletriptan (RELPAX) 40 MG tablet; May repeat in 2 hours if headache persists or recurs.  Dispense: 9 tablet; Refill: 0  5. Pruritus - hydrOXYzine (ATARAX/VISTARIL) 10 MG tablet; 1 po prn  itching  Dispense: 30 tablet; Refill: 1  6. Gastroesophageal reflux disease without esophagitis Avoid spicy foods Do not eat 2 hours prior to bedtime - pantoprazole (PROTONIX) 40 MG tablet; Take 1 tablet (40 mg total) by mouth daily.  Dispense: 90 tablet; Refill: 0   Labs pending Health Maintenance reviewed Diet and exercise encouraged  Follow up plan: prn   Laura Laura Gores, FNP

## 2020-04-29 LAB — CMP14+EGFR
ALT: 58 IU/L — ABNORMAL HIGH (ref 0–32)
AST: 52 IU/L — ABNORMAL HIGH (ref 0–40)
Albumin/Globulin Ratio: 1.7 (ref 1.2–2.2)
Albumin: 4.1 g/dL (ref 3.8–4.8)
Alkaline Phosphatase: 82 IU/L (ref 44–121)
BUN/Creatinine Ratio: 11 (ref 9–23)
BUN: 7 mg/dL (ref 6–20)
Bilirubin Total: 0.3 mg/dL (ref 0.0–1.2)
CO2: 24 mmol/L (ref 20–29)
Calcium: 9.7 mg/dL (ref 8.7–10.2)
Chloride: 106 mmol/L (ref 96–106)
Creatinine, Ser: 0.66 mg/dL (ref 0.57–1.00)
GFR calc Af Amer: 131 mL/min/{1.73_m2} (ref >59)
GFR calc non Af Amer: 113 mL/min/{1.73_m2} (ref >59)
Globulin, Total: 2.4 g/dL (ref 1.5–4.5)
Glucose: 95 mg/dL (ref 65–99)
Potassium: 4.5 mmol/L (ref 3.5–5.2)
Sodium: 143 mmol/L (ref 134–144)
Total Protein: 6.5 g/dL (ref 6.0–8.5)

## 2020-04-29 LAB — CBC WITH DIFFERENTIAL/PLATELET
Basophils Absolute: 0 10*3/uL (ref 0.0–0.2)
Basos: 1 %
EOS (ABSOLUTE): 0.1 10*3/uL (ref 0.0–0.4)
Eos: 1 %
Hematocrit: 42.3 % (ref 34.0–46.6)
Hemoglobin: 13.8 g/dL (ref 11.1–15.9)
Immature Grans (Abs): 0 10*3/uL (ref 0.0–0.1)
Immature Granulocytes: 0 %
Lymphocytes Absolute: 3.1 10*3/uL (ref 0.7–3.1)
Lymphs: 40 %
MCH: 29.3 pg (ref 26.6–33.0)
MCHC: 32.6 g/dL (ref 31.5–35.7)
MCV: 90 fL (ref 79–97)
Monocytes Absolute: 0.6 10*3/uL (ref 0.1–0.9)
Monocytes: 7 %
Neutrophils Absolute: 3.9 10*3/uL (ref 1.4–7.0)
Neutrophils: 51 %
Platelets: 196 10*3/uL (ref 150–450)
RBC: 4.71 x10E6/uL (ref 3.77–5.28)
RDW: 12.7 % (ref 11.7–15.4)
WBC: 7.7 10*3/uL (ref 3.4–10.8)

## 2020-05-08 ENCOUNTER — Ambulatory Visit: Payer: BC Managed Care – PPO | Admitting: Nurse Practitioner

## 2020-06-01 ENCOUNTER — Other Ambulatory Visit: Payer: Self-pay | Admitting: Nurse Practitioner

## 2020-06-01 DIAGNOSIS — G43109 Migraine with aura, not intractable, without status migrainosus: Secondary | ICD-10-CM

## 2020-06-09 ENCOUNTER — Telehealth: Payer: BC Managed Care – PPO | Admitting: Emergency Medicine

## 2020-06-09 DIAGNOSIS — J069 Acute upper respiratory infection, unspecified: Secondary | ICD-10-CM

## 2020-06-09 MED ORDER — AMOXICILLIN-POT CLAVULANATE 875-125 MG PO TABS
1.0000 | ORAL_TABLET | Freq: Two times a day (BID) | ORAL | 0 refills | Status: DC
Start: 2020-06-09 — End: 2020-08-17

## 2020-06-09 MED ORDER — BENZONATATE 100 MG PO CAPS
100.0000 mg | ORAL_CAPSULE | Freq: Two times a day (BID) | ORAL | 0 refills | Status: DC | PRN
Start: 2020-06-09 — End: 2020-10-12

## 2020-06-09 MED ORDER — FLUTICASONE PROPIONATE 50 MCG/ACT NA SUSP
2.0000 | Freq: Every day | NASAL | 0 refills | Status: DC
Start: 2020-06-09 — End: 2020-10-12

## 2020-06-09 NOTE — Addendum Note (Signed)
Addended by: Roxy Horseman B on: 06/09/2020 08:51 AM   Modules accepted: Orders

## 2020-06-09 NOTE — Progress Notes (Signed)

## 2020-07-09 ENCOUNTER — Other Ambulatory Visit: Payer: Self-pay | Admitting: Nurse Practitioner

## 2020-07-09 DIAGNOSIS — L299 Pruritus, unspecified: Secondary | ICD-10-CM

## 2020-07-17 ENCOUNTER — Telehealth: Payer: Self-pay

## 2020-07-17 NOTE — Telephone Encounter (Signed)
Patient + for COVID appt changed to televisit and she is aware of CDC guidelines and verbalized understanding.

## 2020-07-20 ENCOUNTER — Encounter: Payer: Self-pay | Admitting: Nurse Practitioner

## 2020-07-20 ENCOUNTER — Other Ambulatory Visit: Payer: Self-pay

## 2020-07-20 ENCOUNTER — Ambulatory Visit (INDEPENDENT_AMBULATORY_CARE_PROVIDER_SITE_OTHER): Payer: BC Managed Care – PPO | Admitting: Nurse Practitioner

## 2020-07-20 DIAGNOSIS — F5101 Primary insomnia: Secondary | ICD-10-CM

## 2020-07-20 MED ORDER — ZOLPIDEM TARTRATE 10 MG PO TABS: 10.0000 mg | ORAL_TABLET | Freq: Every evening | ORAL | 2 refills | Status: DC | PRN

## 2020-07-20 NOTE — Progress Notes (Signed)
Virtual Visit via telephone Note Due to COVID-19 pandemic this visit was conducted virtually. This visit type was conducted due to national recommendations for restrictions regarding the COVID-19 Pandemic (e.g. social distancing, sheltering in place) in an effort to limit this patient's exposure and mitigate transmission in our community. All issues noted in this document were discussed and addressed.  A physical exam was not performed with this format.  I connected with Laura Combs on 07/20/20 at 10:30 by telephone and verified that I am speaking with the correct person using two identifiers. Laura Combs is currently located at home and her children is currently with her during visit. The provider, Mary-Margaret Daphine Deutscher, FNP is located in their office at time of visit.  I discussed the limitations, risks, security and privacy concerns of performing an evaluation and management service by telephone and the availability of in person appointments. I also discussed with the patient that there may be a patient responsible charge related to this service. The patient expressed understanding and agreed to proceed.   History and Present Illness:   Chief Complaint: Sleeping Problem   HPI Patient is contacted via phone with c/o: - she has been having trouble sleeping since she had surgery. Has gotten worse. She was on tylenol PM and that had not helped. Shehas also been taking benadrly 3x a day since she had covid and she still can't sleep. She has tried all different OTC sleep aids and none of that helped. She dug in her cabinet and found some xanax and that helped her sleep. We gave her some vistaril and that did not help.   Review of Systems  Constitutional: Negative for diaphoresis and weight loss.  Eyes: Negative for blurred vision, double vision and pain.  Respiratory: Negative for shortness of breath.   Cardiovascular: Negative for chest pain, palpitations, orthopnea and leg swelling.   Gastrointestinal: Negative for abdominal pain.  Skin: Negative for rash.  Neurological: Negative for dizziness, sensory change, loss of consciousness, weakness and headaches.  Endo/Heme/Allergies: Negative for polydipsia. Does not bruise/bleed easily.  Psychiatric/Behavioral: Negative for memory loss. The patient does not have insomnia.   All other systems reviewed and are negative.    Observations/Objective: Alert and oriented- answers all questions appropriately No distress    Assessment and Plan: Laura Combs in today with chief complaint of Sleeping Problem   1. Primary insomnia Bedtime routine Do not eat 2 hours prior to bedtime - zolpidem (AMBIEN) 10 MG tablet; Take 1 tablet (10 mg total) by mouth at bedtime as needed for sleep.  Dispense: 30 tablet; Refill: 2      Follow Up Instructions: prn    I discussed the assessment and treatment plan with the patient. The patient was provided an opportunity to ask questions and all were answered. The patient agreed with the plan and demonstrated an understanding of the instructions.   The patient was advised to call back or seek an in-person evaluation if the symptoms worsen or if the condition fails to improve as anticipated.  The above assessment and management plan was discussed with the patient. The patient verbalized understanding of and has agreed to the management plan. Patient is aware to call the clinic if symptoms persist or worsen. Patient is aware when to return to the clinic for a follow-up visit. Patient educated on when it is appropriate to go to the emergency department.   Time call ended:  10:45  I provided 15 minutes of non-face-to-face time during this encounter.  Mary-Margaret Malini Flemings, FNP   

## 2020-07-24 ENCOUNTER — Ambulatory Visit: Payer: BC Managed Care – PPO | Admitting: Nurse Practitioner

## 2020-08-01 ENCOUNTER — Other Ambulatory Visit: Payer: Self-pay | Admitting: Nurse Practitioner

## 2020-08-01 DIAGNOSIS — K219 Gastro-esophageal reflux disease without esophagitis: Secondary | ICD-10-CM

## 2020-08-17 ENCOUNTER — Telehealth: Payer: BC Managed Care – PPO | Admitting: Physician Assistant

## 2020-08-17 DIAGNOSIS — N390 Urinary tract infection, site not specified: Secondary | ICD-10-CM

## 2020-08-17 MED ORDER — CEPHALEXIN 500 MG PO CAPS: ORAL_CAPSULE | ORAL | 0 refills | Status: DC

## 2020-08-17 MED ORDER — FLUCONAZOLE 150 MG PO TABS
150.0000 mg | ORAL_TABLET | Freq: Once | ORAL | 0 refills | Status: AC
Start: 2020-08-17 — End: 2020-08-17

## 2020-08-17 NOTE — Progress Notes (Signed)
We are sorry that you are not feeling well.  Here is how we plan to help!  Based on what you shared with me it looks like you most likely have a simple urinary tract infection.  A UTI (Urinary Tract Infection) is a bacterial infection of the bladder.  Most cases of urinary tract infections are simple to treat but a key part of your care is to encourage you to drink plenty of fluids and watch your symptoms carefully.  I have prescribed Keflex 500 mg twice a day for 7 days.  I have also prescribed diflucan for possible yeast infection treatment however, this medication will not treat your UTI.  Please take both as prescribed.  Your symptoms should gradually improve. Call us if the burning in your urine worsens, you develop worsening fever, back pain or pelvic pain or if your symptoms do not resolve after completing the antibiotic.  Urinary tract infections can be prevented by drinking plenty of water to keep your body hydrated.  Also be sure when you wipe, wipe from front to back and don't hold it in!  If possible, empty your bladder every 4 hours.  Your e-visit answers were reviewed by a board certified advanced clinical practitioner to complete your personal care plan.  Depending on the condition, your plan could have included both over the counter or prescription medications.  If there is a problem please reply  once you have received a response from your provider.  Your safety is important to [place].  If you have drug allergies check your prescription carefully.    You can use MyChart to ask questions about today's visit, request a non-urgent call back, or ask for a work or school excuse for 24 hours related to this e-Visit. If it has been greater than 24 hours you will need to follow up with your provider, or enter a new e-Visit to address those concerns.   You will get an e-mail in the next two days asking about your experience.  I hope that your e-visit has been valuable and will speed your  recovery. Thank you for using e-visits.  Greater than 5 minutes, yet less than 10 minutes of time have been spent researching, coordinating, and implementing care for this patient today

## 2020-09-03 ENCOUNTER — Other Ambulatory Visit: Payer: Self-pay | Admitting: Nurse Practitioner

## 2020-09-03 ENCOUNTER — Telehealth: Payer: Self-pay | Admitting: *Deleted

## 2020-09-03 DIAGNOSIS — F5101 Primary insomnia: Secondary | ICD-10-CM

## 2020-09-03 MED ORDER — ZOLPIDEM TARTRATE 10 MG PO TABS: 10.0000 mg | ORAL_TABLET | Freq: Every evening | ORAL | 2 refills | Status: DC | PRN

## 2020-09-03 NOTE — Telephone Encounter (Signed)
We have sent PA and waiting on response. Patient may want to pick up prescription and pay cash until we hear from insurance.

## 2020-09-03 NOTE — Telephone Encounter (Signed)
PA in process waiting on clinical questions

## 2020-09-04 NOTE — Telephone Encounter (Signed)
Questions filled out and sent to plan

## 2020-09-07 NOTE — Telephone Encounter (Signed)
Please let patient know that insurance will on ly pay for 15 tablets a day.

## 2020-09-07 NOTE — Telephone Encounter (Signed)
[facility]  received a prior authorization request from your provider for coverage of Zolpidem for you. The [facility] Plan has approved criteria in place for coverage of this medication. We needed additional clinical information from your provider in order to make a decision to either approve or deny the request. We did not receive the additional clinical information within the time allowed to make the decision; therefore, the request is denied. The request is denied because: Coverage for this medication is denied for the following reason(s). We reviewed the information we received about your condition and circumstances. We used the plan approved policy when making this decision. The policy states that this medication may be approved when other potential causes of sleep disturbances (e.g., poor sleep environment, other medical causes of insomnia, medications which may cause insomnia) have been addressed or are currently being addressed. Based on the policy and the information we have, the request is denied. The information provided to [place] indicates that other potential causes of sleep disturbances have not been addressed or are not currently being addressed.

## 2020-09-09 NOTE — Telephone Encounter (Signed)
R/C on Prior Auth

## 2020-09-25 ENCOUNTER — Other Ambulatory Visit: Payer: Self-pay | Admitting: Nurse Practitioner

## 2020-09-25 DIAGNOSIS — G43109 Migraine with aura, not intractable, without status migrainosus: Secondary | ICD-10-CM

## 2020-10-04 IMAGING — CR DG CHEST 1V PORT
1 series · 1 of 1 positions shown · non-contrast
Comparison: 08/17/2012

CLINICAL DATA: Shortness of breath

EXAM:
PORTABLE CHEST 1 VIEW

[portable]
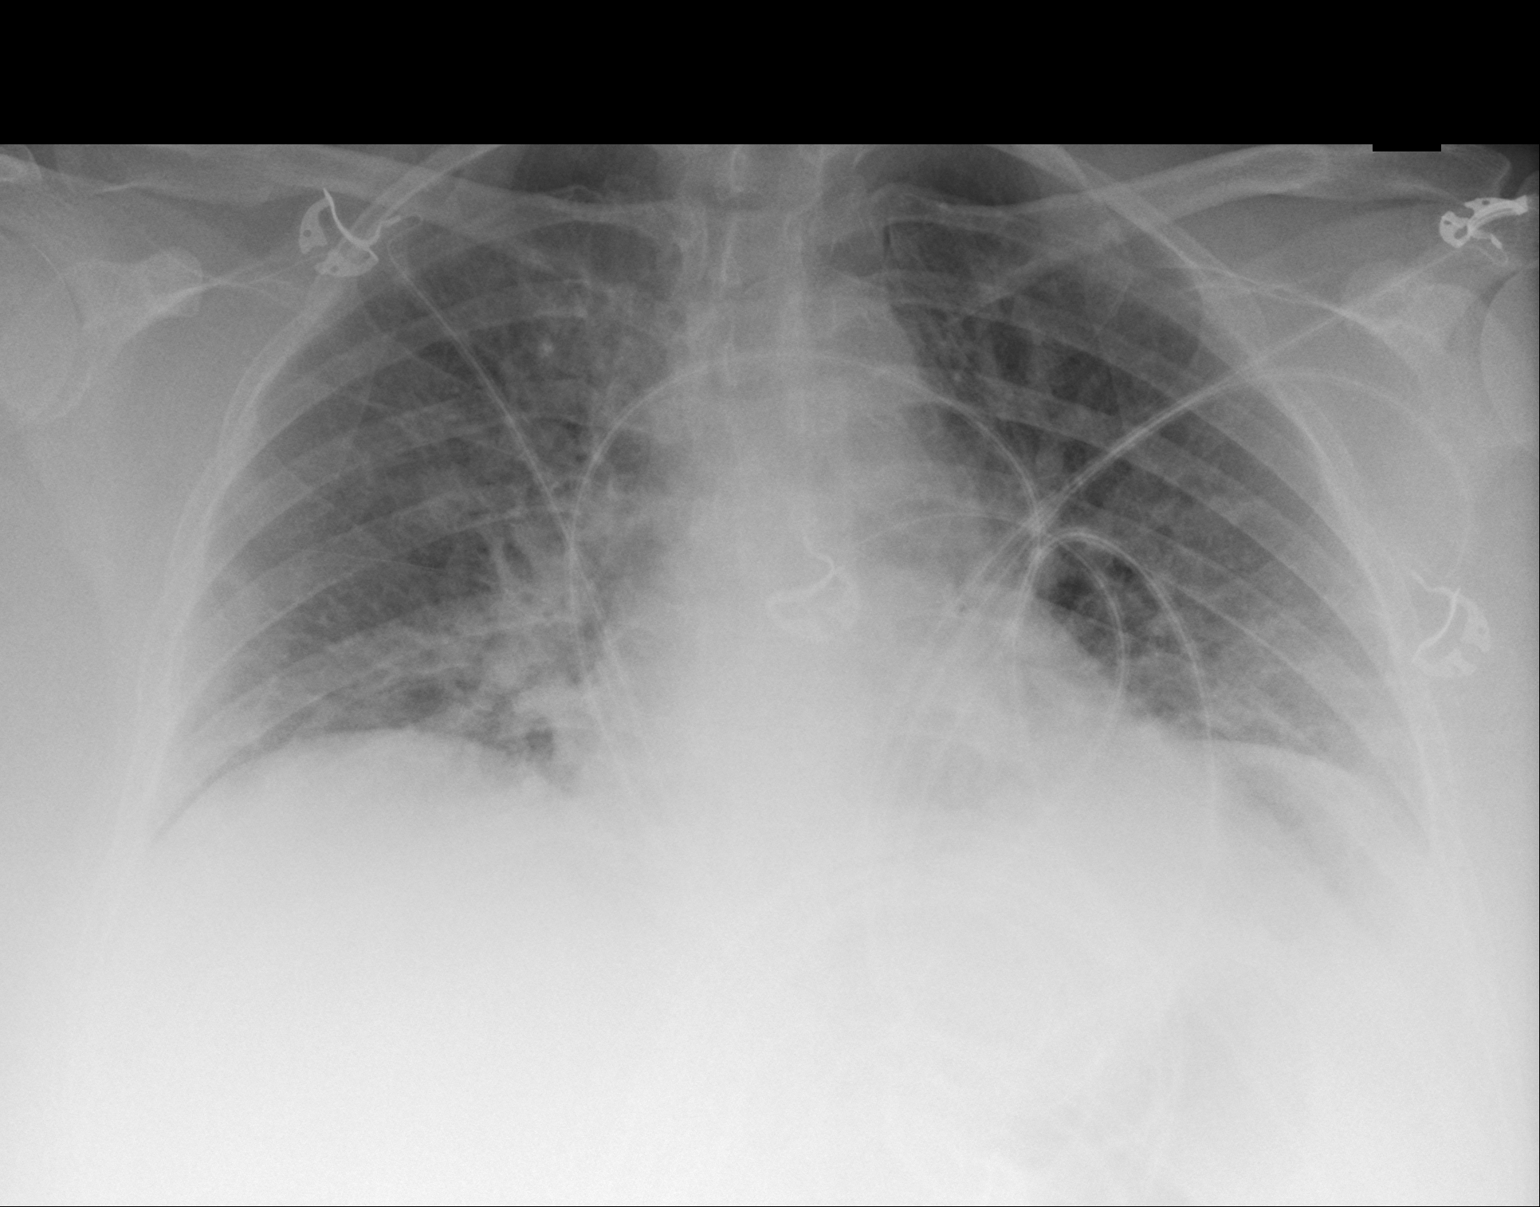

[1 of 1 positions shown; findings below may reference images not displayed]

FINDINGS: Low lung volumes. Perihilar and left basilar interstitial and
ground-glass opacity. Normal heart size. No pneumothorax
IMPRESSION: Perihilar and left basilar ground-glass opacity, may reflect
atypical/viral pneumonia.

## 2020-10-12 ENCOUNTER — Telehealth: Payer: Self-pay | Admitting: Physician Assistant

## 2020-10-12 ENCOUNTER — Other Ambulatory Visit: Payer: Self-pay | Admitting: Nurse Practitioner

## 2020-10-12 DIAGNOSIS — J349 Unspecified disorder of nose and nasal sinuses: Secondary | ICD-10-CM

## 2020-10-12 MED ORDER — FLUCONAZOLE 150 MG PO TABS: 150.0000 mg | ORAL_TABLET | Freq: Once | ORAL | 0 refills | Status: AC

## 2020-10-12 MED ORDER — AMOXICILLIN 875 MG PO TABS: 875.0000 mg | ORAL_TABLET | Freq: Two times a day (BID) | ORAL | 0 refills | Status: DC

## 2020-10-12 NOTE — Progress Notes (Signed)
Patient is out of [place] in [facility]. Unable to treat today.

## 2020-10-29 ENCOUNTER — Other Ambulatory Visit: Payer: Self-pay | Admitting: Nurse Practitioner

## 2020-10-29 DIAGNOSIS — F411 Generalized anxiety disorder: Secondary | ICD-10-CM

## 2020-10-29 DIAGNOSIS — G43109 Migraine with aura, not intractable, without status migrainosus: Secondary | ICD-10-CM

## 2020-11-16 ENCOUNTER — Ambulatory Visit: Payer: BC Managed Care – PPO | Admitting: Nurse Practitioner

## 2020-11-16 ENCOUNTER — Other Ambulatory Visit: Payer: Self-pay

## 2020-11-16 ENCOUNTER — Encounter: Payer: Self-pay | Admitting: Nurse Practitioner

## 2020-11-16 VITALS — BP 143/94 | HR 72 | Temp 98.8°F | Resp 20 | Ht 62.0 in | Wt 192.0 lb

## 2020-11-16 DIAGNOSIS — G4700 Insomnia, unspecified: Secondary | ICD-10-CM | POA: Insufficient documentation

## 2020-11-16 DIAGNOSIS — K219 Gastro-esophageal reflux disease without esophagitis: Secondary | ICD-10-CM

## 2020-11-16 DIAGNOSIS — F411 Generalized anxiety disorder: Secondary | ICD-10-CM

## 2020-11-16 DIAGNOSIS — F5101 Primary insomnia: Secondary | ICD-10-CM | POA: Diagnosis not present

## 2020-11-16 DIAGNOSIS — I10 Essential (primary) hypertension: Secondary | ICD-10-CM | POA: Diagnosis not present

## 2020-11-16 MED ORDER — PANTOPRAZOLE SODIUM 40 MG PO TBEC: 1.0000 | DELAYED_RELEASE_TABLET | Freq: Every day | ORAL | 1 refills | Status: DC

## 2020-11-16 MED ORDER — ESCITALOPRAM OXALATE 20 MG PO TABS: 1.0000 | ORAL_TABLET | Freq: Every day | ORAL | 1 refills | Status: DC

## 2020-11-16 MED ORDER — LISINOPRIL 20 MG PO TABS
20.0000 mg | ORAL_TABLET | Freq: Every day | ORAL | 1 refills | Status: DC
Start: 2020-11-16 — End: 2021-07-02

## 2020-11-16 MED ORDER — ZOLPIDEM TARTRATE 10 MG PO TABS
10.0000 mg | ORAL_TABLET | Freq: Every evening | ORAL | 2 refills | Status: DC | PRN
Start: 2020-11-16 — End: 2021-04-05

## 2020-11-16 NOTE — Patient Instructions (Signed)
Textbook of family medicine (9th ed., pp. 1062-1073). [place], [place]: [employer].">  Stress, Adult Stress is a normal reaction to life events. Stress is what you feel when life demands more than you are used to, or more than you think you can handle. Some stress can be useful, such as studying for a test or meeting a deadline at work. Stress that occurs too often or for too long can cause problems. It can affect your emotional health and interfere with relationships and normal daily activities. Too much stress can weaken your body's defense system (immune system) and increase your risk for physical illness. If you already have a medical problem, stress can make it worse. What are the causes? All sorts of life events can cause stress. An event that causes stress for one person may not be stressful for another person. Major life events, whether positive or negative, commonly cause stress. Examples include:  Losing a job or starting a new job.  Losing a loved one.  Moving to a new town or home.  Getting married or divorced.  Having a baby.  Getting injured or sick. Less obvious life events can also cause stress, especially if they occur day after day or in combination with each other. Examples include:  Working long hours.  Driving in traffic.  Caring for children.  Being in debt.  Being in a difficult relationship. What are the signs or symptoms? Stress can cause emotional symptoms, including:  Anxiety. This is feeling worried, afraid, on edge, overwhelmed, or out of control.  Anger, including irritation or impatience.  Depression. This is feeling sad, down, helpless, or guilty.  Trouble focusing, remembering, or making decisions. Stress can cause physical symptoms, including:  Aches and pains. These may affect your head, neck, back, stomach, or other areas of your body.  Tight muscles or a clenched jaw.  Low energy.  Trouble sleeping. Stress can cause unhealthy  behaviors, including:  Eating to feel better (overeating) or skipping meals.  Working too much or putting off tasks.  Smoking, drinking alcohol, or using drugs to feel better. How is this diagnosed? Stress is diagnosed through an assessment by your health care provider. He or she may diagnose this condition based on:  Your symptoms and any stressful life events.  Your medical history.  Tests to rule out other causes of your symptoms. Depending on your condition, your health care provider may refer you to a specialist for further evaluation. How is this treated? Stress management techniques are the recommended treatment for stress. Medicine is not typically recommended for the treatment of stress. Techniques to reduce your reaction to stressful life events include:  Stress identification. Monitor yourself for symptoms of stress and identify what causes stress for you. These skills may help you to avoid or prepare for stressful events.  Time management. Set your priorities, keep a calendar of events, and learn to say no. Taking these actions can help you avoid making too many commitments. Techniques for coping with stress include:  Rethinking the problem. Try to think realistically about stressful events rather than ignoring them or overreacting. Try to find the positives in a stressful situation rather than focusing on the negatives.  Exercise. Physical exercise can release both physical and emotional tension. The key is to find a form of exercise that you enjoy and do it regularly.  Relaxation techniques. These relax the body and mind. The key is to find one or more that you enjoy and use the techniques regularly. Examples include: ?   Meditation, deep breathing, or progressive relaxation techniques. ? Yoga or tai chi. ? Biofeedback, mindfulness techniques, or journaling. ? Listening to music, being out in nature, or participating in other hobbies.  Practicing a healthy lifestyle.  Eat a balanced diet, drink plenty of water, limit or avoid caffeine, and get plenty of sleep.  Having a strong support network. Spend time with family, friends, or other people you enjoy being around. Express your feelings and talk things over with someone you trust. Counseling or talk therapy with a mental health professional may be helpful if you are having trouble managing stress on your own.   Follow these instructions at home: Lifestyle  Avoid drugs.  Do not use any products that contain nicotine or tobacco, such as cigarettes, e-cigarettes, and chewing tobacco. If you need help quitting, ask your health care provider.  Limit alcohol intake to no more than 1 drink a day for nonpregnant women and 2 drinks a day for men. One drink equals 12 oz of beer, 5 oz of wine, or 1 oz of hard liquor  Do not use alcohol or drugs to relax.  Eat a balanced diet that includes fresh fruits and vegetables, whole grains, lean meats, fish, eggs, and beans, and low-fat dairy. Avoid processed foods and foods high in added fat, sugar, and salt.  Exercise at least 30 minutes on 5 or more days each week.  Get 7-8 hours of sleep each night.   General instructions  Practice stress management techniques as discussed with your health care provider.  Drink enough fluid to keep your urine clear or pale yellow.  Take over-the-counter and prescription medicines only as told by your health care provider.  Keep all follow-up visits as told by your health care provider. This is important.   Contact a health care provider if:  Your symptoms get worse.  You have new symptoms.  You feel overwhelmed by your problems and can no longer manage them on your own. Get help right away if:  You have thoughts of hurting yourself or others. If you ever feel like you may hurt yourself or others, or have thoughts about taking your own life, get help right away. You can go to your nearest emergency department or  call:  Your local emergency services (911 in the [place]  A suicide crisis helpline, such as the National Suicide Prevention Lifeline at 1-800-273-8255. This is open 24 hours a day. Summary  Stress is a normal reaction to life events. It can cause problems if it happens too often or for too long.  Practicing stress management techniques is the best way to treat stress.  Counseling or talk therapy with a mental health professional may be helpful if you are having trouble managing stress on your own. This information is not intended to replace advice given to you by your health care provider. Make sure you discuss any questions you have with your health care provider. Document Revised: 03/06/2020 Document Reviewed: 03/06/2020 Elsevier Patient Education  2021 [employer]  

## 2020-11-16 NOTE — Progress Notes (Signed)
Subjective:    Patient ID: Laura Combs, female    DOB: 28-Oct-1980, 40 y.o.   MRN: [id number]   Chief Complaint: Medical Management of Chronic Issues   HPI  Chief Complaint: Medical Management of Chronic Issues    HPI:  1. Primary hypertension On lisinoril . No c/o chest pain, sob or headache. She does not check her blood pressure at home. BP Readings from Last 3 Encounters:  11/16/20 (!) 143/94  04/28/20 (!) 128/91  02/13/20 127/82     2. GAD (generalized anxiety disorder) Is on lexapro and is doing well. GAD 7 : Generalized Anxiety Score 11/16/2020 04/28/2020 09/02/2019 12/31/2018  Nervous, Anxious, on Edge 0 0 0 3  Control/stop worrying 0 0 0 2  Worry too much - different things 0 0 0 0  Trouble relaxing 0 0 - 0  Restless 0 0 3 0  Easily annoyed or irritable 0 0 2 3  Afraid - awful might happen 0 0 0 0  Total GAD 7 Score 0 0 - 8  Anxiety Difficulty Not difficult at all Not difficult at all Not difficult at all Somewhat difficult     3. Primary insomnia Is on ambien nightly and sleeps about 7-8 hours a night.  4. 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 calcium citrate-vitamin D (CALCIUM CITRATE CHEWY BITE) 500-500 MG-UNIT chewable tablet Chew 1 tablet by mouth 3 (three) times daily.  . cetirizine (ZYRTEC) 10 MG tablet Take 10 mg by mouth at bedtime.   [name] eletriptan (RELPAX) 40 MG tablet TAKE 1 TABLET AT ONSET OF HEADACHE, MAY REPEAT ONCE IN 2 HOURS  . escitalopram (LEXAPRO) 20 MG tablet Take 1 tablet (20 mg total) by mouth daily. (NEEDS TO BE SEEN BEFORE NEXT REFILL)  . lisinopril (ZESTRIL) 20 MG tablet Take 1 tablet  (20 mg total) by mouth daily.  . pantoprazole (PROTONIX) 40 MG tablet TAKE 1 TABLET DAILY  . zolpidem (AMBIEN) 10 MG tablet Take 1 tablet (10 mg total) by mouth at bedtime as needed for sleep.  . [DISCONTINUED] amoxicillin (AMOXIL) 875 MG tablet Take 1 tablet (875 mg total) by mouth 2 (two) times daily. 1 po BID  . [DISCONTINUED] cephALEXin (KEFLEX) 500 MG capsule 1 cap po bid x 7 days  . [DISCONTINUED] hydrOXYzine (ATARAX/VISTARIL) 10 MG tablet TAKE (1) TABLET AS NEEDED FOR ITCHING   No facility-administered encounter medications on file as of 11/16/2020.    Past Surgical History:  Procedure Laterality Date  . DILATION AND EVACUATION  11/03/2011   Procedure: DILATATION AND EVACUATION;  Surgeon: Levi Aland, MD;  Location: [facility];  Service: Gynecology;  Laterality: N/A;  . GASTRIC ROUX-EN-Y N/A 01/28/2020   Procedure: LAPAROSCOPIC ROUX-EN-Y GASTRIC BYPASS WITH UPPER ENDOSCOPY;  Surgeon: Berna Bue, MD;  Location: [facility];  Service: General;  Laterality: N/A;  . TONSILLECTOMY      Family History  Problem Relation Age of Onset  . Diabetes Father   . Hypertension Father   . Hyperlipidemia Father   . Diabetes Maternal Aunt   . Diabetes Maternal Grandmother   .  Diabetes Maternal Grandfather     New complaints: None today  Social history: Lives with husband and 2 kids  Controlled substance contract: 11/16/20   Review of Systems  Constitutional: Negative for diaphoresis.  Eyes: Negative for pain.  Respiratory: Negative for shortness of breath.   Cardiovascular: Negative for chest pain, palpitations and leg swelling.  Gastrointestinal: Negative for abdominal pain.  Endocrine: Negative for polydipsia.  Skin: Negative for rash.  Neurological: Negative for dizziness, weakness and headaches.  Hematological: Does not bruise/bleed easily.  All other systems reviewed and are negative.      Objective:   Physical Exam Vitals and nursing note reviewed.  Constitutional:       General: She is not in acute distress.    Appearance: Normal appearance. She is well-developed.  HENT:     Head: Normocephalic.     Nose: Nose normal.  Eyes:     Pupils: Pupils are equal, round, and reactive to light.  Neck:     Vascular: No carotid bruit or JVD.  Cardiovascular:     Rate and Rhythm: Normal rate and regular rhythm.     Heart sounds: Normal heart sounds.  Pulmonary:     Effort: Pulmonary effort is normal. No respiratory distress.     Breath sounds: Normal breath sounds. No wheezing or rales.  Chest:     Chest wall: No tenderness.  Abdominal:     General: Bowel sounds are normal. There is no distension or abdominal bruit.     Palpations: Abdomen is soft. There is no hepatomegaly, splenomegaly, mass or pulsatile mass.     Tenderness: There is no abdominal tenderness.  Musculoskeletal:        General: Normal range of motion.     Cervical back: Normal range of motion and neck supple.  Lymphadenopathy:     Cervical: No cervical adenopathy.  Skin:    General: Skin is warm and dry.  Neurological:     Mental Status: She is alert and oriented to person, place, and time.     Deep Tendon Reflexes: Reflexes are normal and symmetric.  Psychiatric:        Behavior: Behavior normal.        Thought Content: Thought content normal.        Judgment: Judgment normal.    BP (!) 143/94   Pulse 72   Temp 98.8 F (37.1 C) (Temporal)   Resp 20   Ht 5\' 2"  (1.575 m)   Wt 192 lb (87.1 kg)   SpO2 98%   BMI 35.12 kg/m        Assessment & Plan:  Laura Combs comes in today with chief complaint of Medical Management of Chronic Issues   Diagnosis and orders addressed:  1. Primary hypertension Low sodium diet - lisinopril (ZESTRIL) 20 MG tablet; Take 1 tablet (20 mg total) by mouth daily.  Dispense: 90 tablet; Refill: 1  2. GAD (generalized anxiety disorder) Stress management - escitalopram (LEXAPRO) 20 MG tablet; Take 1 tablet (20 mg total) by mouth daily. (NEEDS TO  BE SEEN BEFORE NEXT REFILL)  Dispense: 90 tablet; Refill: 1  3. Primary insomnia Bedtime routine - zolpidem (AMBIEN) 10 MG tablet; Take 1 tablet (10 mg total) by mouth at bedtime as needed for sleep.  Dispense: 30 tablet; Refill: 2  4. Morbid obesity (HCC) Continue with weight loss  5. Gastroesophageal reflux disease without esophagitis Avoid spicy foods Do not eat 2 hours prior to bedtime  - pantoprazole (PROTONIX) 40 MG  tablet; Take 1 tablet (40 mg total) by mouth daily.  Dispense: 90 tablet; Refill: 1   Labs pending Health Maintenance reviewed Diet and exercise encouraged  Follow up plan: 6 months   Mary-Margaret Daphine Deutscher, FNP

## 2020-11-28 ENCOUNTER — Other Ambulatory Visit: Payer: Self-pay | Admitting: Nurse Practitioner

## 2020-11-28 DIAGNOSIS — G43109 Migraine with aura, not intractable, without status migrainosus: Secondary | ICD-10-CM

## 2020-12-01 NOTE — Telephone Encounter (Signed)
Last office visit 11/16/20 Last refill 10/29/20, #9, no refills

## 2020-12-29 ENCOUNTER — Other Ambulatory Visit: Payer: Self-pay | Admitting: Nurse Practitioner

## 2020-12-29 DIAGNOSIS — G43829 Menstrual migraine, not intractable, without status migrainosus: Secondary | ICD-10-CM

## 2020-12-29 MED ORDER — SUMATRIPTAN SUCCINATE 100 MG PO TABS
100.0000 mg | ORAL_TABLET | Freq: Once | ORAL | 2 refills | Status: DC | PRN
Start: 2020-12-29 — End: 2021-11-01

## 2020-12-29 NOTE — Telephone Encounter (Signed)
Why can you not take motrin or ibuprofen? There is a combination medication that has an NSAID in it that works really well

## 2020-12-29 NOTE — Telephone Encounter (Signed)
No tylenol will not work the same. Have you tried imitrex

## 2021-03-27 IMAGING — DX DG CHEST 2V
2 series · 2 of 2 positions shown · non-contrast
Comparison: 05/22/2019

CLINICAL DATA: Morbid obesity. Pre-op evaluation for bariatric
surgery.

EXAM:
CHEST - 2 VIEW

[chest pa]
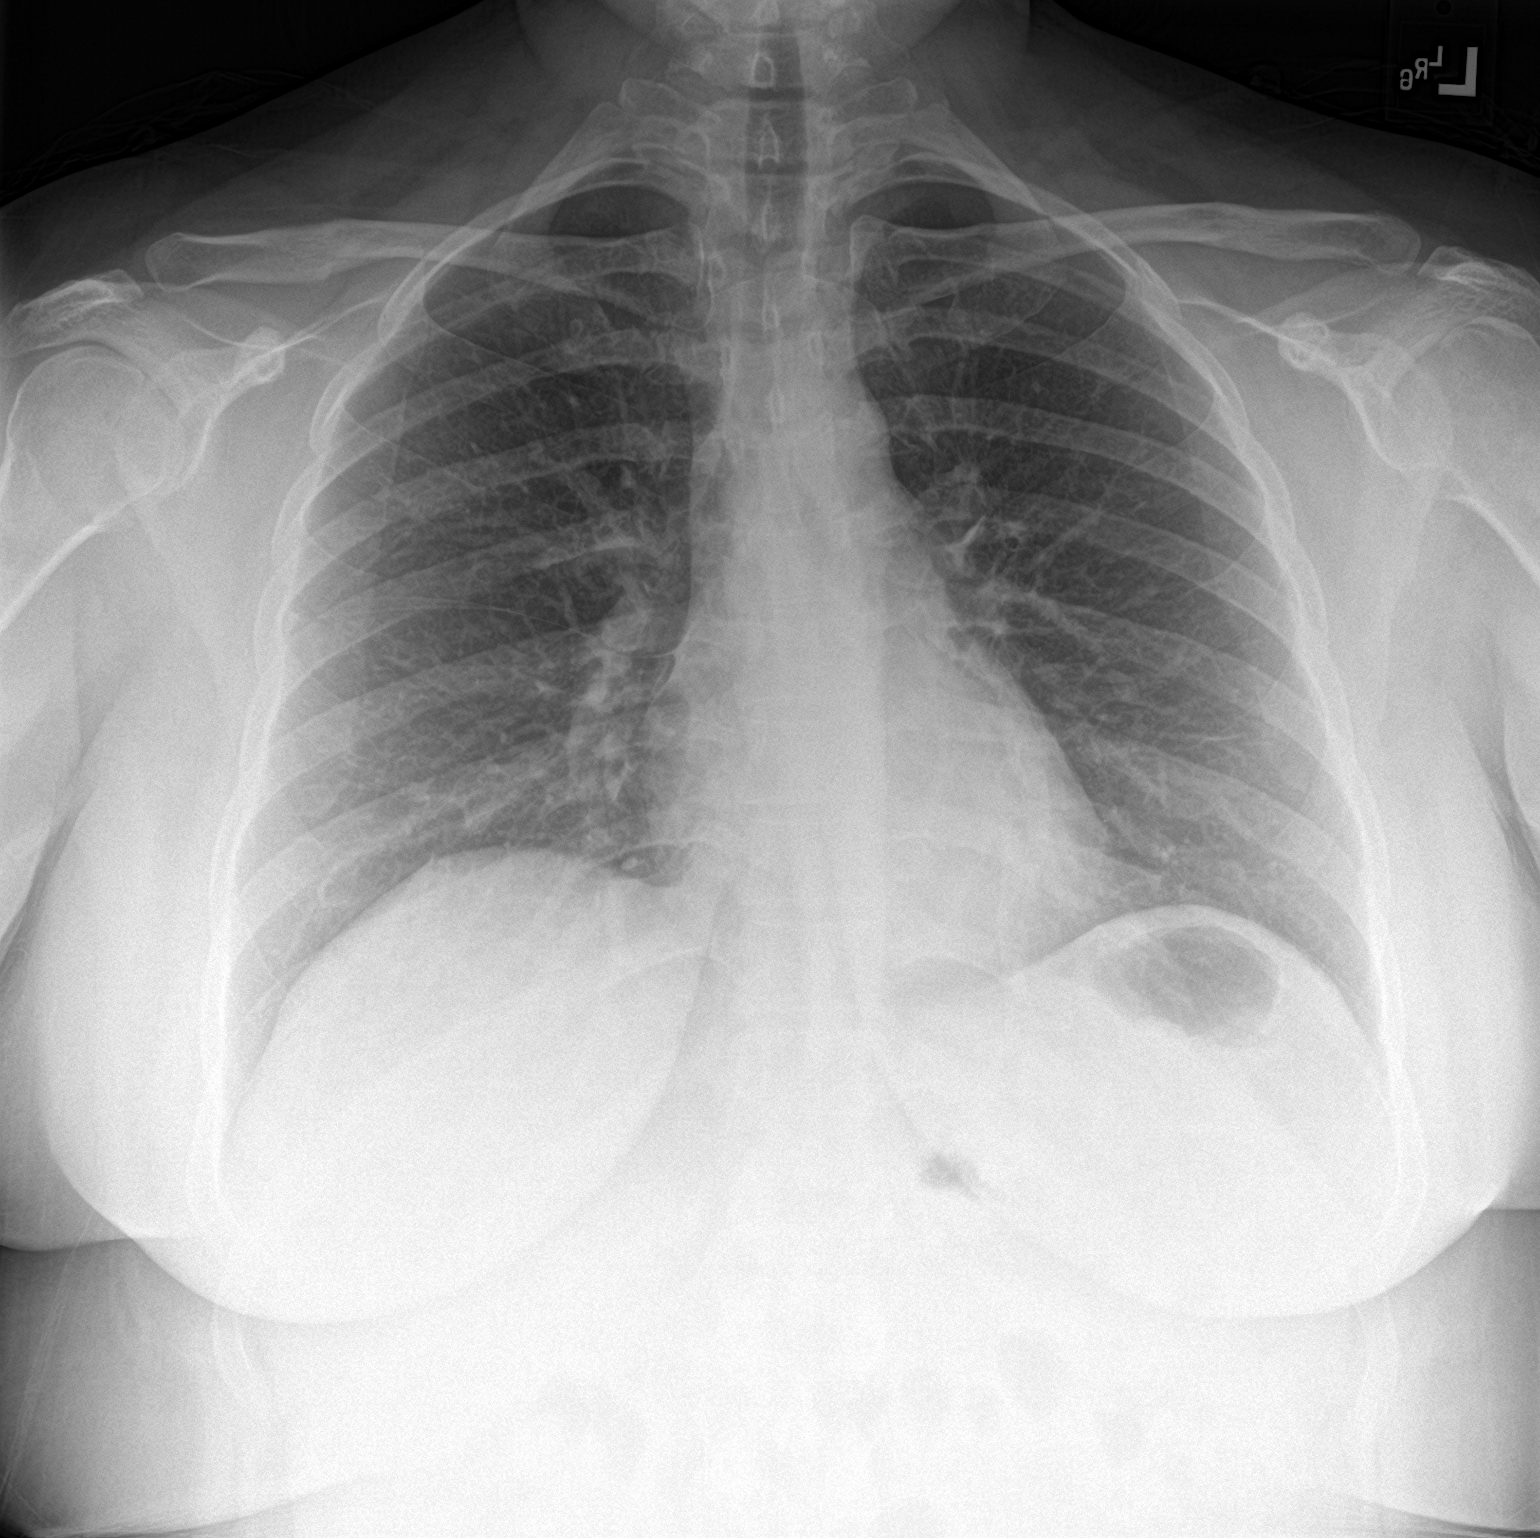

[chest lat]
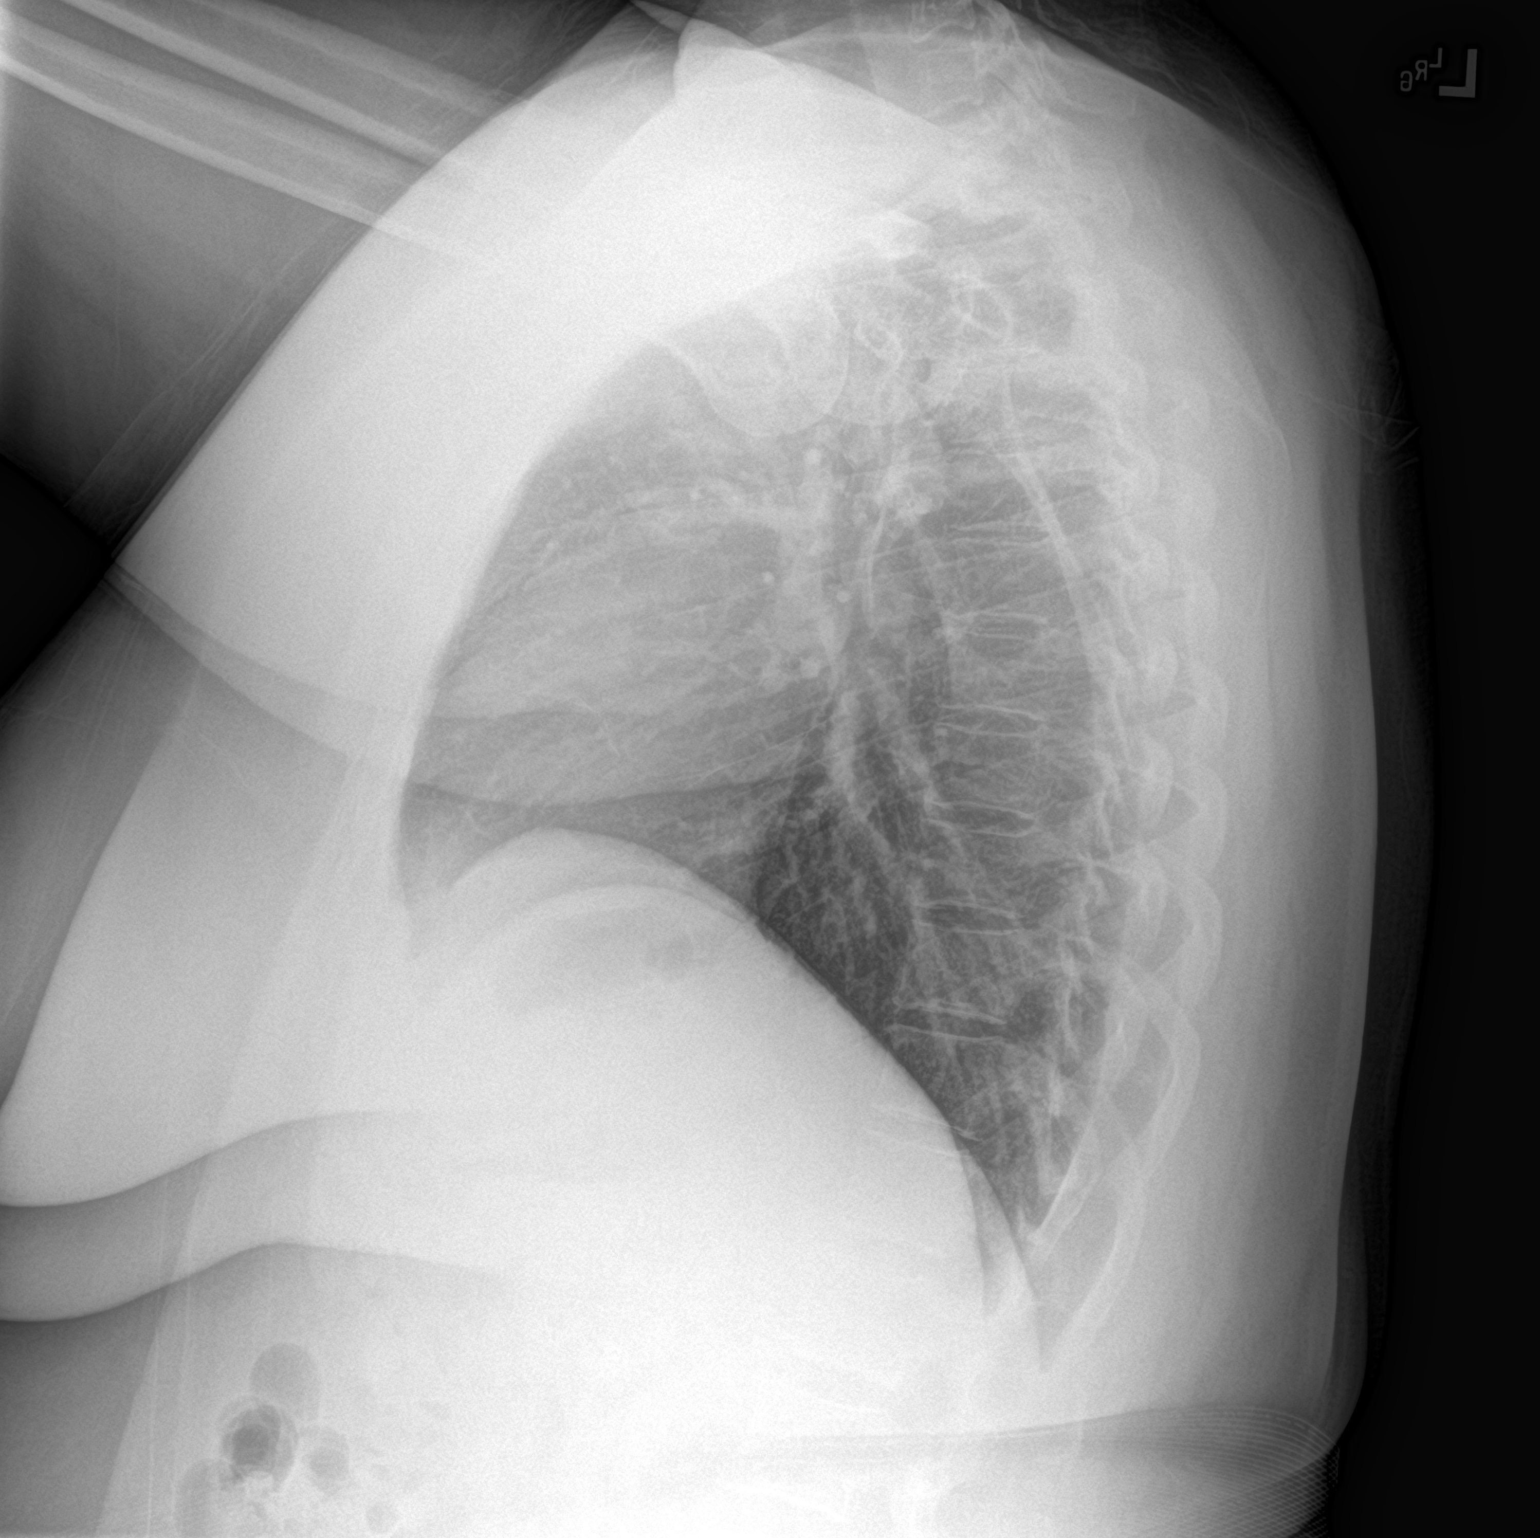

[2 of 2 positions shown; findings below may reference images not displayed]

FINDINGS: The heart size and mediastinal contours are within normal limits.
Both lungs are clear. The visualized skeletal structures are
unremarkable.
IMPRESSION: No active cardiopulmonary disease.

## 2021-04-01 ENCOUNTER — Telehealth (INDEPENDENT_AMBULATORY_CARE_PROVIDER_SITE_OTHER): Payer: BC Managed Care – PPO | Admitting: Nurse Practitioner

## 2021-04-01 ENCOUNTER — Encounter: Payer: Self-pay | Admitting: Nurse Practitioner

## 2021-04-01 DIAGNOSIS — F411 Generalized anxiety disorder: Secondary | ICD-10-CM

## 2021-04-01 MED ORDER — BUSPIRONE HCL 5 MG PO TABS: 5.0000 mg | ORAL_TABLET | Freq: Two times a day (BID) | ORAL | 1 refills | Status: DC

## 2021-04-01 NOTE — Progress Notes (Signed)
Virtual Visit via video Note   Due to COVID-19 pandemic this visit was conducted virtually. This visit type was conducted due to national recommendations for restrictions regarding the COVID-19 Pandemic (e.g. social distancing, sheltering in place) in an effort to limit this patient's exposure and mitigate transmission in our community. All issues noted in this document were discussed and addressed.  A physical exam was not performed with this format.  I connected with  Laura Combs  on 04/01/21 at 12;46 by video and verified that I am speaking with the correct person using two identifiers. Laura Combs is currently located at work and no one is currently with her during visit. The provider, Mary-Margaret Daphine Deutscher, FNP is located in their office at time of visit.  I discussed the limitations, risks, security and privacy concerns of performing an evaluation and management service by video  and the availability of in person appointments. I also discussed with the patient that there may be a patient responsible charge related to this service. The patient expressed understanding and agreed to proceed.   History and Present Illness:   Chief Complaint: anxiety  HPI Patient calls in stating that she has increased stress. Increase work load at work and home. She is on lexapro 20mg  daily. Depression screen [facility] 2/9 04/01/2021 11/16/2020 04/28/2020  Decreased Interest 1 0 0  Down, Depressed, Hopeless 1 0 0  PHQ - 2 Score 2 0 0  Altered sleeping 0 0 0  Tired, decreased energy 0 0 0  Change in appetite 0 0 0  Feeling bad or failure about yourself  0 0 0  Trouble concentrating 1 0 0  Moving slowly or fidgety/restless 0 0 0  Suicidal thoughts 0 0 0  PHQ-9 Score 3 0 0  Difficult doing work/chores Somewhat difficult Not difficult at all Not difficult at all  Some recent data might be hidden   GAD 7 : Generalized Anxiety Score 04/01/2021 11/16/2020 04/28/2020 09/02/2019  Nervous, Anxious, on Edge 3  0 0 0  Control/stop worrying 3 0 0 0  Worry too much - different things 3 0 0 0  Trouble relaxing 3 0 0 -  Restless 1 0 0 3  Easily annoyed or irritable 3 0 0 2  Afraid - awful might happen 0 0 0 0  Total GAD 7 Score 16 0 0 -  Anxiety Difficulty Somewhat difficult Not difficult at all Not difficult at all Not difficult at all       Review of Systems  Constitutional:  Negative for diaphoresis and weight loss.  Eyes:  Negative for blurred vision, double vision and pain.  Respiratory:  Negative for shortness of breath.   Cardiovascular:  Negative for chest pain, palpitations, orthopnea and leg swelling.  Gastrointestinal:  Negative for abdominal pain.  Skin:  Negative for rash.  Neurological:  Negative for dizziness, sensory change, loss of consciousness, weakness and headaches.  Endo/Heme/Allergies:  Negative for polydipsia. Does not bruise/bleed easily.  Psychiatric/Behavioral:  Negative for memory loss and substance abuse. Depression: acuteas.The patient does not have insomnia.   All other systems reviewed and are negative.     Observations/Objective: Alert and oriented- answers all questions appropriately No distress   Assessment and Plan: Madesyn Stotler in today with chief complaint of No chief complaint on file.   1. GAD (generalized anxiety disorder) Added buspar and will see if helps Stress management - busPIRone (BUSPAR) 5 MG tablet; Take 1 tablet (5 mg total) by mouth 2 (two) times daily.  Dispense: 30 tablet; Refill: 1     Follow Up Instructions: prn    I discussed the assessment and treatment plan with the patient. The patient was provided an opportunity to ask questions and all were answered. The patient agreed with the plan and demonstrated an understanding of the instructions.   The patient was advised to call back or seek an in-person evaluation if the symptoms worsen or if the condition fails to improve as anticipated.  The above assessment and  management plan was discussed with the patient. The patient verbalized understanding of and has agreed to the management plan. Patient is aware to call the clinic if symptoms persist or worsen. Patient is aware when to return to the clinic for a follow-up visit. Patient educated on when it is appropriate to go to the emergency department.   Time call ended: 1:00  I provided 16 minutes of face-to-face time during this encounter.    Mary-Margaret Daphine Deutscher, FNP

## 2021-04-03 ENCOUNTER — Other Ambulatory Visit: Payer: Self-pay | Admitting: Nurse Practitioner

## 2021-04-03 DIAGNOSIS — F5101 Primary insomnia: Secondary | ICD-10-CM

## 2021-05-05 ENCOUNTER — Other Ambulatory Visit: Payer: Self-pay | Admitting: Nurse Practitioner

## 2021-05-05 DIAGNOSIS — F411 Generalized anxiety disorder: Secondary | ICD-10-CM

## 2021-05-24 ENCOUNTER — Other Ambulatory Visit: Payer: Self-pay | Admitting: Nurse Practitioner

## 2021-05-24 DIAGNOSIS — F411 Generalized anxiety disorder: Secondary | ICD-10-CM

## 2021-05-24 DIAGNOSIS — K219 Gastro-esophageal reflux disease without esophagitis: Secondary | ICD-10-CM

## 2021-06-21 ENCOUNTER — Other Ambulatory Visit: Payer: Self-pay | Admitting: Nurse Practitioner

## 2021-06-21 DIAGNOSIS — F411 Generalized anxiety disorder: Secondary | ICD-10-CM

## 2021-06-22 ENCOUNTER — Other Ambulatory Visit: Payer: Self-pay | Admitting: Nurse Practitioner

## 2021-06-22 DIAGNOSIS — F411 Generalized anxiety disorder: Secondary | ICD-10-CM

## 2021-06-22 MED ORDER — OSELTAMIVIR PHOSPHATE 75 MG PO CAPS: 75.0000 mg | ORAL_CAPSULE | Freq: Every day | ORAL | 0 refills | Status: DC

## 2021-06-22 MED ORDER — BUSPIRONE HCL 5 MG PO TABS: 5.0000 mg | ORAL_TABLET | Freq: Two times a day (BID) | ORAL | 1 refills | Status: DC

## 2021-07-02 ENCOUNTER — Other Ambulatory Visit: Payer: Self-pay | Admitting: Nurse Practitioner

## 2021-07-02 DIAGNOSIS — F5101 Primary insomnia: Secondary | ICD-10-CM

## 2021-07-02 DIAGNOSIS — I10 Essential (primary) hypertension: Secondary | ICD-10-CM

## 2021-07-08 ENCOUNTER — Encounter: Payer: Self-pay | Admitting: Nurse Practitioner

## 2021-07-08 ENCOUNTER — Telehealth (INDEPENDENT_AMBULATORY_CARE_PROVIDER_SITE_OTHER): Payer: BC Managed Care – PPO | Admitting: Nurse Practitioner

## 2021-07-08 DIAGNOSIS — F5101 Primary insomnia: Secondary | ICD-10-CM | POA: Diagnosis not present

## 2021-07-08 MED ORDER — ZOLPIDEM TARTRATE 10 MG PO TABS: 10.0000 mg | ORAL_TABLET | Freq: Every evening | ORAL | 5 refills | Status: DC | PRN

## 2021-07-08 NOTE — Patient Instructions (Signed)

## 2021-07-08 NOTE — Progress Notes (Signed)
Virtual Visit Consent   Laura Combs, you are scheduled for a virtual visit with Laura Daphine Deutscher, FNP, a [facility] provider, today.     Just as with appointments in the office, your consent must be obtained to participate.  Your consent will be active for this visit and any virtual visit you may have with one of our providers in the next 365 days.     If you have a MyChart account, a copy of this consent can be sent to you electronically.  All virtual visits are billed to your insurance company just like a traditional visit in the office.    As this is a virtual visit, video technology does not allow for your provider to perform a traditional examination.  This may limit your provider's ability to fully assess your condition.  If your provider identifies any concerns that need to be evaluated in person or the need to arrange testing (such as labs, EKG, etc.), we will make arrangements to do so.     Although advances in technology are sophisticated, we cannot ensure that it will always work on either your end or our end.  If the connection with a video visit is poor, the visit may have to be switched to a telephone visit.  With either a video or telephone visit, we are not always able to ensure that we have a secure connection.     I need to obtain your verbal consent now.   Are you willing to proceed with your visit today? YES   Laura Combs has provided verbal consent on 07/08/2021 for a virtual visit (video or telephone).   Laura Daphine Deutscher, FNP   Date: 07/08/2021 12:47 PM   Virtual Visit via Video Note   I, Laura Combs, connected with Laura Combs ([id number], 28-Jun-1981) on 07/08/21 at 12:30 PM EST by a video-enabled telemedicine application and verified that I am speaking with the correct person using two identifiers.  Location: Patient: Virtual Visit Location Patient: Other: work Provider: Pharmacist, community: Mobile   I discussed the  limitations of evaluation and management by telemedicine and the availability of in person appointments. The patient expressed understanding and agreed to proceed.    History of Present Illness: Laura Combs is a 41 y.o. who identifies as a female who was assigned female at birth, and is being seen today for insomnia.  HPI: Patient does video for her insomnia. She has been on [place] for several months and has been sleeping well. Sleeps about 8 hours a  night and feels well rested.   ROS  Problems:  Patient Active Problem List   Diagnosis Date Noted   Insomnia 11/16/2020   Morbid obesity (HCC) 01/28/2020   COVID-19 virus infection 05/22/2019   Abnormal liver function 05/22/2019   Hypertensive disorder 02/08/2017   GAD (generalized anxiety disorder) 03/02/2016   Migraine 09/28/2015    Allergies: No Known Allergies Medications:  Current Outpatient Medications:    busPIRone (BUSPAR) 5 MG tablet, Take 1 tablet (5 mg total) by mouth 2 (two) times daily., Disp: 60 tablet, Rfl: 1   calcium citrate-vitamin D (CALCIUM CITRATE CHEWY BITE) 500-500 MG-UNIT chewable tablet, Chew 1 tablet by mouth 3 (three) times daily., Disp: , Rfl:    cetirizine (ZYRTEC) 10 MG tablet, Take 10 mg by mouth at bedtime. , Disp: , Rfl:    escitalopram (LEXAPRO) 20 MG tablet, TAKE 1 TABLET DAILY, Disp: 90 tablet, Rfl: 0   lisinopril (ZESTRIL) 20 MG tablet, Take 1  tablet (20 mg total) by mouth daily. (NEEDS TO BE SEEN BEFORE NEXT REFILL), Disp: 30 tablet, Rfl: 0   oseltamivir (TAMIFLU) 75 MG capsule, Take 1 capsule (75 mg total) by mouth daily., Disp: 10 capsule, Rfl: 0   pantoprazole (PROTONIX) 40 MG tablet, Take 1 tablet (40 mg total) by mouth daily., Disp: 90 tablet, Rfl: 0   SUMAtriptan (IMITREX) 100 MG tablet, Take 1 tablet (100 mg total) by mouth once as needed for migraine. May repeat in 2 hours if headache persists or recurs., Disp: 30 tablet, Rfl: 2   zolpidem (AMBIEN) 10 MG tablet, TAKE 1 TABLET AT BEDTIME AS  NEEDED FOR SLEEP, Disp: 30 tablet, Rfl: 2  Observations/Objective: Patient is well-developed, well-nourished in no acute distress.  Resting comfortably  at home.  Head is normocephalic, atraumatic.  No labored breathing.  Speech is clear and coherent with logical content.  Patient is alert and oriented at baseline.    Assessment and Plan:  Shalyn Koral in today with chief complaint of No chief complaint on file.   1. Primary insomnia Bedtime routine Rest  Meds ordered this encounter  Medications   zolpidem (AMBIEN) 10 MG tablet    Sig: Take 1 tablet (10 mg total) by mouth at bedtime as needed. for sleep    Dispense:  30 tablet    Refill:  5    Order Specific Question:   Supervising Provider    Answer:   Arville Care A [[id number]]      Follow Up Instructions: I discussed the assessment and treatment plan with the patient. The patient was provided an opportunity to ask questions and all were answered. The patient agreed with the plan and demonstrated an understanding of the instructions.  A copy of instructions were sent to the patient via MyChart.  The patient was advised to call back or seek an in-person evaluation if the symptoms worsen or if the condition fails to improve as anticipated.  Time:  I spent 9  minutes with the patient via telehealth technology discussing the above problems/concerns.    Laura Daphine Deutscher, FNP

## 2021-07-22 ENCOUNTER — Encounter: Payer: Self-pay | Admitting: Family Medicine

## 2021-07-22 ENCOUNTER — Ambulatory Visit (INDEPENDENT_AMBULATORY_CARE_PROVIDER_SITE_OTHER): Payer: BC Managed Care – PPO | Admitting: Family Medicine

## 2021-07-22 DIAGNOSIS — J3489 Other specified disorders of nose and nasal sinuses: Secondary | ICD-10-CM

## 2021-07-22 DIAGNOSIS — R051 Acute cough: Secondary | ICD-10-CM

## 2021-07-22 DIAGNOSIS — U071 COVID-19: Secondary | ICD-10-CM

## 2021-07-22 NOTE — Progress Notes (Signed)
Virtual Visit via telephone Note  I connected with Laura Combs on 07/22/21 at 1657 by telephone and verified that I am speaking with the correct person using two identifiers. Laura Combs is currently located at home and patient are currently with her during visit. The provider, Fransisca Kaufmann Cabot Cromartie, MD is located in their office at time of visit.  Call ended at 1702  I discussed the limitations, risks, security and privacy concerns of performing an evaluation and management service by telephone and the availability of in person appointments. I also discussed with the patient that there may be a patient responsible charge related to this service. The patient expressed understanding and agreed to proceed.   History and Present Illness: Patient is not feeling well for the past 2 days.  She is having congestion and coughing.  She denies fevers or chills or SOB or wheezing.  She is 2nd grade teacher.  She was vaccinated for covid. She is not feeling too bad, mainly she just needed an official test for her workplace and she is managing it conservatively and feels like things are going well.  She has only the congestion and a few symptoms.  She mainly tested because she works in the school system and things spread pretty easily when she is in her class.  Outpatient Encounter Medications as of 07/22/2021  Medication Sig   busPIRone (BUSPAR) 5 MG tablet Take 1 tablet (5 mg total) by mouth 2 (two) times daily.   calcium citrate-vitamin D (CALCIUM CITRATE CHEWY BITE) 500-500 MG-UNIT chewable tablet Chew 1 tablet by mouth 3 (three) times daily.   cetirizine (ZYRTEC) 10 MG tablet Take 10 mg by mouth at bedtime.    escitalopram (LEXAPRO) 20 MG tablet TAKE 1 TABLET DAILY   lisinopril (ZESTRIL) 20 MG tablet Take 1 tablet (20 mg total) by mouth daily. (NEEDS TO BE SEEN BEFORE NEXT REFILL)   oseltamivir (TAMIFLU) 75 MG capsule Take 1 capsule (75 mg total) by mouth daily.   pantoprazole (PROTONIX) 40 MG  tablet Take 1 tablet (40 mg total) by mouth daily.   SUMAtriptan (IMITREX) 100 MG tablet Take 1 tablet (100 mg total) by mouth once as needed for migraine. May repeat in 2 hours if headache persists or recurs.   zolpidem (AMBIEN) 10 MG tablet Take 1 tablet (10 mg total) by mouth at bedtime as needed. for sleep   No facility-administered encounter medications on file as of 07/22/2021.    Review of Systems  Constitutional:  Negative for chills and fever.  HENT:  Positive for congestion, postnasal drip and rhinorrhea. Negative for ear discharge, ear pain, sinus pressure, sneezing and sore throat.   Eyes:  Negative for pain, redness and visual disturbance.  Respiratory:  Positive for cough. Negative for chest tightness and shortness of breath.   Cardiovascular:  Negative for chest pain and leg swelling.  Genitourinary:  Negative for difficulty urinating and dysuria.  Musculoskeletal:  Negative for back pain and gait problem.  Skin:  Negative for rash.  Neurological:  Negative for light-headedness and headaches.  Psychiatric/Behavioral:  Negative for agitation and behavioral problems.   All other systems reviewed and are negative.  Observations/Objective: Patient sounds comfortable and in no acute distress  Assessment and Plan: Problem List Items Addressed This Visit       Other   COVID-19 virus infection   Other Visit Diagnoses     Acute cough    -  Primary   Relevant Orders   Novel Coronavirus, NAA (  [facility])   Nasal drainage       Relevant Orders   Novel Coronavirus, NAA ([facility])       Patient did testing and just needs the results for the testing for her workplace.  Recommended quarantine for the 5 days and then back to work with a mask for 5 days and let us know if anything worsens. Follow up plan: Return if symptoms worsen or fail to improve.     I discussed the assessment and treatment plan with the patient. The patient was provided an opportunity to ask questions and  all were answered. The patient agreed with the plan and demonstrated an understanding of the instructions.   The patient was advised to call back or seek an in-person evaluation if the symptoms worsen or if the condition fails to improve as anticipated.  The above assessment and management plan was discussed with the patient. The patient verbalized understanding of and has agreed to the management plan. Patient is aware to call the clinic if symptoms persist or worsen. Patient is aware when to return to the clinic for a follow-up visit. Patient educated on when it is appropriate to go to the emergency department.    I provided 5 minutes of non-face-to-face time during this encounter.    Worthy Rancher, MD

## 2021-07-24 LAB — NOVEL CORONAVIRUS, NAA: SARS-CoV-2, NAA: DETECTED — AB

## 2021-07-24 LAB — SARS-COV-2, NAA 2 DAY TAT

## 2021-08-03 ENCOUNTER — Other Ambulatory Visit: Payer: Self-pay | Admitting: Nurse Practitioner

## 2021-08-03 ENCOUNTER — Other Ambulatory Visit: Payer: Self-pay

## 2021-08-03 DIAGNOSIS — I10 Essential (primary) hypertension: Secondary | ICD-10-CM

## 2021-08-03 MED ORDER — LISINOPRIL 20 MG PO TABS: 20.0000 mg | ORAL_TABLET | Freq: Every day | ORAL | 0 refills | Status: DC

## 2021-08-03 NOTE — Telephone Encounter (Signed)
Appt made 08/20/2021, per MMM said we would give her one month if she is out and then she does need an in person appt.

## 2021-08-03 NOTE — Telephone Encounter (Signed)
30 days given 07/02/22 ntbs for chronic care

## 2021-08-20 ENCOUNTER — Ambulatory Visit: Payer: BC Managed Care – PPO | Admitting: Nurse Practitioner

## 2021-08-20 ENCOUNTER — Encounter: Payer: Self-pay | Admitting: Nurse Practitioner

## 2021-08-20 VITALS — BP 133/89 | HR 71 | Temp 98.9°F | Resp 20 | Ht 62.0 in | Wt 158.0 lb

## 2021-08-20 DIAGNOSIS — K219 Gastro-esophageal reflux disease without esophagitis: Secondary | ICD-10-CM

## 2021-08-20 DIAGNOSIS — Z6828 Body mass index (BMI) 28.0-28.9, adult: Secondary | ICD-10-CM

## 2021-08-20 DIAGNOSIS — I10 Essential (primary) hypertension: Secondary | ICD-10-CM

## 2021-08-20 DIAGNOSIS — F411 Generalized anxiety disorder: Secondary | ICD-10-CM | POA: Diagnosis not present

## 2021-08-20 DIAGNOSIS — F5101 Primary insomnia: Secondary | ICD-10-CM | POA: Diagnosis not present

## 2021-08-20 DIAGNOSIS — G43829 Menstrual migraine, not intractable, without status migrainosus: Secondary | ICD-10-CM

## 2021-08-20 MED ORDER — ESCITALOPRAM OXALATE 20 MG PO TABS: 20.0000 mg | ORAL_TABLET | Freq: Every day | ORAL | 1 refills | Status: DC

## 2021-08-20 MED ORDER — BUSPIRONE HCL 5 MG PO TABS: 5.0000 mg | ORAL_TABLET | Freq: Two times a day (BID) | ORAL | 1 refills | Status: DC

## 2021-08-20 MED ORDER — PANTOPRAZOLE SODIUM 40 MG PO TBEC: 40.0000 mg | DELAYED_RELEASE_TABLET | Freq: Every day | ORAL | 1 refills | Status: DC

## 2021-08-20 MED ORDER — LISINOPRIL 20 MG PO TABS: 20.0000 mg | ORAL_TABLET | Freq: Every day | ORAL | 1 refills | Status: DC

## 2021-08-20 NOTE — Patient Instructions (Signed)

## 2021-08-20 NOTE — Progress Notes (Signed)
Subjective:    Patient ID: Laura Combs, female    DOB: 09-06-1980, 41 y.o.   MRN: [id number]   Chief Complaint: No chief complaint on file.    HPI:  Laura Combs is a 41 y.o. who identifies as a female who was assigned female at birth.   Social history: Lives with: husband and children Work history: Education officer, museum   Comes in today for follow up of the following chronic medical issues:  1. Primary hypertension No c/o chest pain, sob or headache. She has lost a lot of weight  2. Menstrual migraine without status migrainosus, not intractable Having maybe 1-2 a month. She grinds her teeth at night and when she wears mouth guard she has less migraines.  3. Primary insomnia Is on ambien to sleep and is not able to sleep without it.  4. GAD (generalized anxiety disorder) Is on combination of buspar and lexapro. Is doing well.  5. Morbid obesity ([place]) Patient had gastric bypass surgery last year. Weight is down 34 lbs since last visit. And is down 85lbs since surgery Wt Readings from Last 3 Encounters:  08/20/21 158 lb (71.7 kg)  11/16/20 192 lb (87.1 kg)  04/28/20 235 lb (106.6 kg)   BMI Readings from Last 3 Encounters:  08/20/21 28.90 kg/m  11/16/20 35.12 kg/m  04/28/20 42.98 kg/m      New complaints: None today  No Known Allergies Outpatient Encounter Medications as of 08/20/2021  Medication Sig   busPIRone (BUSPAR) 5 MG tablet Take 1 tablet (5 mg total) by mouth 2 (two) times daily.   cetirizine (ZYRTEC) 10 MG tablet Take 10 mg by mouth at bedtime.    escitalopram (LEXAPRO) 20 MG tablet TAKE 1 TABLET DAILY   lisinopril (ZESTRIL) 20 MG tablet Take 1 tablet (20 mg total) by mouth daily. (NEEDS TO BE SEEN BEFORE NEXT REFILL)   pantoprazole (PROTONIX) 40 MG tablet Take 1 tablet (40 mg total) by mouth daily.   SUMAtriptan (IMITREX) 100 MG tablet Take 1 tablet (100 mg total) by mouth once as needed for migraine. May repeat in 2 hours if headache persists or  recurs.   zolpidem (AMBIEN) 10 MG tablet Take 1 tablet (10 mg total) by mouth at bedtime as needed. for sleep   [DISCONTINUED] calcium citrate-vitamin D (CALCIUM CITRATE CHEWY BITE) 500-500 MG-UNIT chewable tablet Chew 1 tablet by mouth 3 (three) times daily.   [DISCONTINUED] oseltamivir (TAMIFLU) 75 MG capsule Take 1 capsule (75 mg total) by mouth daily.   No facility-administered encounter medications on file as of 08/20/2021.    Past Surgical History:  Procedure Laterality Date   DILATION AND EVACUATION  11/03/2011   Procedure: DILATATION AND EVACUATION;  Surgeon: Olga Millers, MD;  Location: [facility];  Service: Gynecology;  Laterality: N/A;   GASTRIC ROUX-EN-Y N/A 01/28/2020   Procedure: LAPAROSCOPIC ROUX-EN-Y GASTRIC BYPASS WITH UPPER ENDOSCOPY;  Surgeon: Clovis Riley, MD;  Location: [facility];  Service: General;  Laterality: N/A;   TONSILLECTOMY      Family History  Problem Relation Age of Onset   Diabetes Father    Hypertension Father    Hyperlipidemia Father    Diabetes Maternal Aunt    Diabetes Maternal Grandmother    Diabetes Maternal Grandfather       Controlled substance contract: n/a     Review of Systems  Constitutional:  Negative for diaphoresis.  Eyes:  Negative for pain.  Respiratory:  Negative for shortness of breath.   Cardiovascular:  Negative for chest pain, palpitations and leg swelling.  Gastrointestinal:  Negative for abdominal pain.  Endocrine: Negative for polydipsia.  Skin:  Negative for rash.  Neurological:  Negative for dizziness, weakness and headaches.  Hematological:  Does not bruise/bleed easily.  All other systems reviewed and are negative.     Objective:   Physical Exam Vitals and nursing note reviewed.  Constitutional:      General: She is not in acute distress.    Appearance: Normal appearance. She is well-developed.  HENT:     Head: Normocephalic.     Right Ear: Tympanic membrane normal.     Left Ear: Tympanic membrane  normal.     Nose: Nose normal.     Mouth/Throat:     Mouth: Mucous membranes are moist.  Eyes:     Pupils: Pupils are equal, round, and reactive to light.  Neck:     Vascular: No carotid bruit or JVD.  Cardiovascular:     Rate and Rhythm: Normal rate and regular rhythm.     Heart sounds: Normal heart sounds.  Pulmonary:     Effort: Pulmonary effort is normal. No respiratory distress.     Breath sounds: Normal breath sounds. No wheezing or rales.  Chest:     Chest wall: No tenderness.  Abdominal:     General: Bowel sounds are normal. There is no distension or abdominal bruit.     Palpations: Abdomen is soft. There is no hepatomegaly, splenomegaly, mass or pulsatile mass.     Tenderness: There is no abdominal tenderness.  Musculoskeletal:        General: Normal range of motion.     Cervical back: Normal range of motion and neck supple.  Lymphadenopathy:     Cervical: No cervical adenopathy.  Skin:    General: Skin is warm and dry.  Neurological:     Mental Status: She is alert and oriented to person, place, and time.     Deep Tendon Reflexes: Reflexes are normal and symmetric.  Psychiatric:        Behavior: Behavior normal.        Thought Content: Thought content normal.        Judgment: Judgment normal.    BP 133/89    Pulse 71    Temp 98.9 F (37.2 C) (Oral)    Resp 20    Ht 5' 2" (1.575 m)    Wt 158 lb (71.7 kg)    SpO2 100%    BMI 28.90 kg/m        Assessment & Plan:   Laura Combs comes in today with chief complaint of No chief complaint on file.   Diagnosis and orders addressed:  1. Primary hypertension Low sodium diet - lisinopril (ZESTRIL) 20 MG tablet; Take 1 tablet (20 mg total) by mouth daily. (NEEDS TO BE SEEN BEFORE NEXT REFILL)  Dispense: 30 tablet; Refill: 1 - CBC with Differential/Platelet - CMP14+EGFR - Lipid panel  2. Menstrual migraine without status migrainosus, not intractable Avoid caffeine  3. Primary insomnia Bedtime routine  4.  GAD (generalized anxiety disorder) Stress management - busPIRone (BUSPAR) 5 MG tablet; Take 1 tablet (5 mg total) by mouth 2 (two) times daily.  Dispense: 60 tablet; Refill: 1 - escitalopram (LEXAPRO) 20 MG tablet; Take 1 tablet (20 mg total) by mouth daily.  Dispense: 90 tablet; Refill: 1  5. BMI 28.0-28.9,adult Discussed diet and exercise for person with BMI >25 Will recheck weight in 3-6 months   6. Gastroesophageal  reflux disease without esophagitis Avoid spicy foods Do not eat 2 hours prior to bedtime  - pantoprazole (PROTONIX) 40 MG tablet; Take 1 tablet (40 mg total) by mouth daily.  Dispense: 90 tablet; Refill: 1   Labs pending Health Maintenance reviewed Diet and exercise encouraged  Follow up plan: prn   Mary-Margaret Hassell Done, FNP

## 2021-08-21 LAB — CBC WITH DIFFERENTIAL/PLATELET
Basophils Absolute: 0 10*3/uL (ref 0.0–0.2)
Basos: 0 %
EOS (ABSOLUTE): 0.1 10*3/uL (ref 0.0–0.4)
Eos: 1 %
Hematocrit: 43.4 % (ref 34.0–46.6)
Hemoglobin: 14.6 g/dL (ref 11.1–15.9)
Immature Grans (Abs): 0 10*3/uL (ref 0.0–0.1)
Immature Granulocytes: 0 %
Lymphocytes Absolute: 3.1 10*3/uL (ref 0.7–3.1)
Lymphs: 43 %
MCH: 31.1 pg (ref 26.6–33.0)
MCHC: 33.6 g/dL (ref 31.5–35.7)
MCV: 92 fL (ref 79–97)
Monocytes Absolute: 0.4 10*3/uL (ref 0.1–0.9)
Monocytes: 5 %
Neutrophils Absolute: 3.6 10*3/uL (ref 1.4–7.0)
Neutrophils: 51 %
Platelets: 191 10*3/uL (ref 150–450)
RBC: 4.7 x10E6/uL (ref 3.77–5.28)
RDW: 12.4 % (ref 11.7–15.4)
WBC: 7.1 10*3/uL (ref 3.4–10.8)

## 2021-08-21 LAB — CMP14+EGFR
ALT: 11 IU/L (ref 0–32)
AST: 14 IU/L (ref 0–40)
Albumin/Globulin Ratio: 1.8 (ref 1.2–2.2)
Albumin: 4.2 g/dL (ref 3.8–4.8)
Alkaline Phosphatase: 76 IU/L (ref 44–121)
BUN/Creatinine Ratio: 11 (ref 9–23)
BUN: 8 mg/dL (ref 6–20)
Bilirubin Total: 0.4 mg/dL (ref 0.0–1.2)
CO2: 24 mmol/L (ref 20–29)
Calcium: 9.1 mg/dL (ref 8.7–10.2)
Chloride: 106 mmol/L (ref 96–106)
Creatinine, Ser: 0.71 mg/dL (ref 0.57–1.00)
Globulin, Total: 2.4 g/dL (ref 1.5–4.5)
Glucose: 85 mg/dL (ref 70–99)
Potassium: 4.1 mmol/L (ref 3.5–5.2)
Sodium: 143 mmol/L (ref 134–144)
Total Protein: 6.6 g/dL (ref 6.0–8.5)
eGFR: 112 mL/min/{1.73_m2} (ref >59)

## 2021-08-21 LAB — LIPID PANEL
Chol/HDL Ratio: 2.8 ratio (ref 0.0–4.4)
Cholesterol, Total: 187 mg/dL (ref 100–199)
HDL: 67 mg/dL (ref >39)
LDL Chol Calc (NIH): 107 mg/dL — ABNORMAL HIGH (ref 0–99)
Triglycerides: 73 mg/dL (ref 0–149)
VLDL Cholesterol Cal: 13 mg/dL (ref 5–40)

## 2021-10-21 ENCOUNTER — Encounter: Payer: Self-pay | Admitting: Nurse Practitioner

## 2021-10-21 ENCOUNTER — Ambulatory Visit (INDEPENDENT_AMBULATORY_CARE_PROVIDER_SITE_OTHER): Payer: BC Managed Care – PPO | Admitting: Nurse Practitioner

## 2021-10-21 DIAGNOSIS — U071 COVID-19: Secondary | ICD-10-CM | POA: Diagnosis not present

## 2021-10-21 MED ORDER — NIRMATRELVIR/RITONAVIR (PAXLOVID)TABLET: 3.0000 | ORAL_TABLET | Freq: Two times a day (BID) | ORAL | 0 refills | Status: AC

## 2021-10-21 NOTE — Progress Notes (Signed)
? ?Virtual Visit  Note ?Due to COVID-19 pandemic this visit was conducted virtually. This visit type was conducted due to national recommendations for restrictions regarding the COVID-19 Pandemic (e.g. social distancing, sheltering in place) in an effort to limit this patient's exposure and mitigate transmission in our community. All issues noted in this document were discussed and addressed.  A physical exam was not performed with this format. ? ?I connected with Laura Combs on 10/21/21 at 9:22 by telephone and verified that I am speaking with the correct person using two identifiers. Laura Combs is currently located at home and no one is currently with her during visit. The provider, Mary-Margaret Daphine Deutscher, FNP is located in their office at time of visit. ? ?I discussed the limitations, risks, security and privacy concerns of performing an evaluation and management service by telephone and the availability of in person appointments. I also discussed with the patient that there may be a patient responsible charge related to this service. The patient expressed understanding and agreed to proceed. ? ? ?History and Present Illness: ? ?URI  ?This is a new problem. Episode onset: 2 days ago. The problem has been gradually worsening. There has been no fever. Associated symptoms include congestion, coughing, headaches, rhinorrhea and a sore throat. She has tried decongestant for the symptoms. The treatment provided mild relief. Tested positive for covid yesterday. ? ? ? ?Review of Systems  ?HENT:  Positive for congestion, rhinorrhea and sore throat.   ?Respiratory:  Positive for cough.   ?Neurological:  Positive for headaches.  ? ? ?Observations/Objective: ?Alert and oriented- answers all questions appropriately ?No distress ?Dry cough ? ?Assessment and Plan: ?Laura Combs in today with chief complaint of Covid Positive ? ? ?1. Real time reverse transcriptase PCR positive for COVID-19 virus ?1. Take meds as  prescribed ?2. Use a cool mist humidifier especially during the winter months and when heat has been humid. ?3. Use saline nose sprays frequently ?4. Saline irrigations of the nose can be very helpful if done frequently. ? * 4X daily for 1 week* ? * Use of a nettie pot can be helpful with this. Follow directions with this* ?5. Drink plenty of fluids ?6. Keep thermostat turn down low ?7.For any cough or congestion- delsym ?8. For fever or aces or pains- take tylenol or ibuprofen appropriate for age and weight. ? * for fevers greater than 101 orally you may alternate ibuprofen and tylenol every  3 hours. ?  ? ?Meds ordered this encounter  ?Medications  ? nirmatrelvir/ritonavir EUA (PAXLOVID) 20 x 150 MG & 10 x 100MG  TABS  ?  Sig: Take 3 tablets by mouth 2 (two) times daily for 5 days. (Take nirmatrelvir 150 mg two tablets twice daily for 5 days and ritonavir 100 mg one tablet twice daily for 5 days) Patient GFR is 97  ?  Dispense:  30 tablet  ?  Refill:  0  ?  Order Specific Question:   Supervising Provider  ?  Answer:   A [[id number]]  ? ? ? ? ?Follow Up Instructions: ?prn ? ?  ?I discussed the assessment and treatment plan with the patient. The patient was provided an opportunity to ask questions and all were answered. The patient agreed with the plan and demonstrated an understanding of the instructions. ?  ?The patient was advised to call back or seek an in-person evaluation if the symptoms worsen or if the condition fails to improve as anticipated. ? ?The above assessment and management  plan was discussed with the patient. The patient verbalized understanding of and has agreed to the management plan. Patient is aware to call the clinic if symptoms persist or worsen. Patient is aware when to return to the clinic for a follow-up visit. Patient educated on when it is appropriate to go to the emergency department.  ? ?Time call ended:  9:40 ? ?I provided 12 minutes of  non face-to-face time during this  encounter. ? ? ? ?Mary-Margaret Daphine Deutscher, FNP ? ? ?

## 2021-10-21 NOTE — Patient Instructions (Signed)
COVID-19: Quarantine and Isolation ?Quarantine ?If you were exposed ?Quarantine and stay away from others when you have been in close contact with someone who has COVID-19. ?Isolate ?If you are sick or test positive ?Isolate when you are sick or when you have COVID-19, even if you don't have symptoms. ?When to stay home ?Calculating quarantine ?The date of your exposure is considered day 0. Day 1 is the first full day after your last contact with a person who has had COVID-19. Stay home and away from other people for at least 5 days. Learn why CDC updated guidance for the general public. ?IF YOU were exposed to COVID-19 and are NOT  ?up to dateIF YOU were exposed to COVID-19 and are NOT on COVID-19 vaccinations ?Quarantine for at least 5 days ?Stay home ?Stay home and quarantine for at least 5 full days. ?Wear a well-fitting mask if you must be around others in your home. ?Do not travel. ?Get tested ?Even if you don't develop symptoms, get tested at least 5 days after you last had close contact with someone with COVID-19. ?After quarantine ?Watch for symptoms ?Watch for symptoms until 10 days after you last had close contact with someone with COVID-19. ?Avoid travel ?It is best to avoid travel until a full 10 days after you last had close contact with someone with COVID-19. ?If you develop symptoms ?Isolate immediately and get tested. Continue to stay home until you know the results. Wear a well-fitting mask around others. ?Take precautions until day 10 ?Wear a well-fitting mask ?Wear a well-fitting mask for 10 full days any time you are around others inside your home or in public. Do not go to places where you are unable to wear a well-fitting mask. ?If you must travel during days 6-10, take precautions. ?Avoid being around people who are more likely to get very sick from COVID-19. ?IF YOU were exposed to COVID-19 and are  ?up to dateIF YOU were exposed to COVID-19 and are on COVID-19 vaccinations ?No  quarantine ?You do not need to stay home unless you develop symptoms. ?Get tested ?Even if you don't develop symptoms, get tested at least 5 days after you last had close contact with someone with COVID-19. ?Watch for symptoms ?Watch for symptoms until 10 days after you last had close contact with someone with COVID-19. ?If you develop symptoms ?Isolate immediately and get tested. Continue to stay home until you know the results. Wear a well-fitting mask around others. ?Take precautions until day 10 ?Wear a well-fitting mask ?Wear a well-fitting mask for 10 full days any time you are around others inside your home or in public. Do not go to places where you are unable to wear a well-fitting mask. ?Take precautions if traveling ?Avoid being around people who are more likely to get very sick from COVID-19. ?IF YOU were exposed to COVID-19 and had confirmed COVID-19 within the past 90 days (you tested positive using a viral test) ?No quarantine ?You do not need to stay home unless you develop symptoms. ?Watch for symptoms ?Watch for symptoms until 10 days after you last had close contact with someone with COVID-19. ?If you develop symptoms ?Isolate immediately and get tested. Continue to stay home until you know the results. Wear a well-fitting mask around others. ?Take precautions until day 10 ?Wear a well-fitting mask ?Wear a well-fitting mask for 10 full days any time you are around others inside your home or in public. Do not go to places where you are   unable to wear a well-fitting mask. ?Take precautions if traveling ?Avoid being around people who are more likely to get very sick from COVID-19. ?Calculating isolation ?Day 0 is your first day of symptoms or a positive viral test. Day 1 is the first full day after your symptoms developed or your test specimen was collected. If you have COVID-19 or have symptoms, isolate for at least 5 days. ?IF YOU tested positive for COVID-19 or have symptoms, regardless of  vaccination status ?Stay home for at least 5 days ?Stay home for 5 days and isolate from others in your home. ?Wear a well-fitting mask if you must be around others in your home. ?Do not travel. ?Ending isolation if you had symptoms ?End isolation after 5 full days if you are fever-free for 24 hours (without the use of fever-reducing medication) and your symptoms are improving. ?Ending isolation if you did NOT have symptoms ?End isolation after at least 5 full days after your positive test. ?If you got very sick from COVID-19 or have a weakened immune system ?You should isolate for at least 10 days. Consult your doctor before ending isolation. ?Take precautions until day 10 ?Wear a well-fitting mask ?Wear a well-fitting mask for 10 full days any time you are around others inside your home or in public. Do not go to places where you are unable to wear a well-fitting mask. ?Do not travel ?Do not travel until a full 10 days after your symptoms started or the date your positive test was taken if you had no symptoms. ?Avoid being around people who are more likely to get very sick from COVID-19. ?Definitions ?Exposure ?Contact with someone infected with SARS-CoV-2, the virus that causes COVID-19, in a way that increases the likelihood of getting infected with the virus. ?Close contact ?A close contact is someone who was less than 6 feet away from an infected person (laboratory-confirmed or a clinical diagnosis) for a cumulative total of 15 minutes or more over a 24-hour period. For example, three individual 5-minute exposures for a total of 15 minutes. People who are exposed to someone with COVID-19 after they completed at least 5 days of isolation are not considered close contacts. ?Quarantine ?Quarantine is a strategy used to prevent transmission of COVID-19 by keeping people who have been in close contact with someone with COVID-19 apart from others. ?Who does not need to quarantine? ?If you had close contact with  someone with COVID-19 and you are in one of the following groups, you do not need to quarantine. ?You are up to date with your COVID-19 vaccines. ?You had confirmed COVID-19 within the last 90 days (meaning you tested positive using a viral test). ?If you are up to date with COVID-19 vaccines, you should wear a well-fitting mask around others for 10 days from the date of your last close contact with someone with COVID-19 (the date of last close contact is considered day 0). Get tested at least 5 days after you last had close contact with someone with COVID-19. If you test positive or develop COVID-19 symptoms, isolate from other people and follow recommendations in the Isolation section below. If you tested positive for COVID-19 with a viral test within the previous 90 days and subsequently recovered and remain without COVID-19 symptoms, you do not need to quarantine or get tested after close contact. You should wear a well-fitting mask around others for 10 days from the date of your last close contact with someone with COVID-19 (the date of last   close contact is considered day 0). If you have COVID-19 symptoms, get tested and isolate from other people and follow recommendations in the Isolation section below. ?Who should quarantine? ?If you come into close contact with someone with COVID-19, you should quarantine if you are not up to date on COVID-19 vaccines. This includes people who are not vaccinated. ?What to do for quarantine ?Stay home and away from other people for at least 5 days (day 0 through day 5) after your last contact with a person who has COVID-19. The date of your exposure is considered day 0. Wear a well-fitting mask when around others at home, if possible. ?For 10 days after your last close contact with someone with COVID-19, watch for fever (100.4?F or greater), cough, shortness of breath, or other COVID-19 symptoms. ?If you develop symptoms, get tested immediately and isolate until you receive  your test results. If you test positive, follow isolation recommendations. ?If you do not develop symptoms, get tested at least 5 days after you last had close contact with someone with COVID-19. ?If you test negative, you c

## 2021-10-22 LAB — NOVEL CORONAVIRUS, NAA: SARS-CoV-2, NAA: NOT DETECTED

## 2021-11-01 ENCOUNTER — Other Ambulatory Visit: Payer: Self-pay | Admitting: Nurse Practitioner

## 2021-11-01 DIAGNOSIS — I10 Essential (primary) hypertension: Secondary | ICD-10-CM

## 2021-11-01 DIAGNOSIS — G43829 Menstrual migraine, not intractable, without status migrainosus: Secondary | ICD-10-CM

## 2022-01-01 ENCOUNTER — Other Ambulatory Visit: Payer: Self-pay | Admitting: Nurse Practitioner

## 2022-01-01 DIAGNOSIS — F5101 Primary insomnia: Secondary | ICD-10-CM

## 2022-01-03 ENCOUNTER — Other Ambulatory Visit: Payer: Self-pay | Admitting: Nurse Practitioner

## 2022-01-03 ENCOUNTER — Telehealth: Payer: Self-pay | Admitting: Nurse Practitioner

## 2022-01-03 DIAGNOSIS — F5101 Primary insomnia: Secondary | ICD-10-CM

## 2022-01-03 NOTE — Telephone Encounter (Signed)
Patient calling about a medication, has questions for the nurse. No other information given. Please call back.

## 2022-01-03 NOTE — Telephone Encounter (Signed)
Patient is out of town, needs her Ambien. Patient states that her husband is going down  to meet her out of town and wanted to see if she could get some called in.   Last OV 10/21/21

## 2022-01-05 DIAGNOSIS — F5101 Primary insomnia: Secondary | ICD-10-CM

## 2022-01-06 MED ORDER — ZOLPIDEM TARTRATE 10 MG PO TABS: 10.0000 mg | ORAL_TABLET | Freq: Every evening | ORAL | 1 refills | Status: DC | PRN

## 2022-01-31 ENCOUNTER — Other Ambulatory Visit: Payer: Self-pay | Admitting: Nurse Practitioner

## 2022-01-31 DIAGNOSIS — I10 Essential (primary) hypertension: Secondary | ICD-10-CM

## 2022-01-31 DIAGNOSIS — F411 Generalized anxiety disorder: Secondary | ICD-10-CM

## 2022-01-31 DIAGNOSIS — G43829 Menstrual migraine, not intractable, without status migrainosus: Secondary | ICD-10-CM

## 2022-02-15 ENCOUNTER — Encounter: Payer: Self-pay | Admitting: Nurse Practitioner

## 2022-02-15 ENCOUNTER — Ambulatory Visit: Payer: BC Managed Care – PPO | Admitting: Nurse Practitioner

## 2022-02-15 VITALS — BP 126/89 | HR 65 | Temp 97.7°F | Resp 20 | Ht 62.0 in | Wt 152.0 lb

## 2022-02-15 DIAGNOSIS — R945 Abnormal results of liver function studies: Secondary | ICD-10-CM

## 2022-02-15 DIAGNOSIS — F5101 Primary insomnia: Secondary | ICD-10-CM | POA: Diagnosis not present

## 2022-02-15 DIAGNOSIS — G43829 Menstrual migraine, not intractable, without status migrainosus: Secondary | ICD-10-CM

## 2022-02-15 DIAGNOSIS — F411 Generalized anxiety disorder: Secondary | ICD-10-CM

## 2022-02-15 DIAGNOSIS — Z6827 Body mass index (BMI) 27.0-27.9, adult: Secondary | ICD-10-CM

## 2022-02-15 DIAGNOSIS — I10 Essential (primary) hypertension: Secondary | ICD-10-CM

## 2022-02-15 DIAGNOSIS — K219 Gastro-esophageal reflux disease without esophagitis: Secondary | ICD-10-CM

## 2022-02-15 MED ORDER — ZOLPIDEM TARTRATE 10 MG PO TABS: 10.0000 mg | ORAL_TABLET | Freq: Every evening | ORAL | 5 refills | Status: DC | PRN

## 2022-02-15 MED ORDER — PANTOPRAZOLE SODIUM 40 MG PO TBEC: 40.0000 mg | DELAYED_RELEASE_TABLET | Freq: Every day | ORAL | 1 refills | Status: DC

## 2022-02-15 MED ORDER — BUSPIRONE HCL 5 MG PO TABS: 5.0000 mg | ORAL_TABLET | Freq: Two times a day (BID) | ORAL | 5 refills | Status: DC

## 2022-02-15 MED ORDER — ESCITALOPRAM OXALATE 20 MG PO TABS: 20.0000 mg | ORAL_TABLET | Freq: Every day | ORAL | 1 refills | Status: DC

## 2022-02-15 MED ORDER — LISINOPRIL 20 MG PO TABS: 20.0000 mg | ORAL_TABLET | Freq: Every day | ORAL | 1 refills | Status: DC

## 2022-02-15 NOTE — Addendum Note (Signed)
Addended by: Bennie Pierini on: 02/15/2022 04:40 PM   Modules accepted: Orders

## 2022-02-15 NOTE — Progress Notes (Signed)
Subjective:    Patient ID: Laura Combs, female    DOB: [birth year], 41 y.o.   MRN: [id number]   Chief Complaint: medical management of chronic issues     HPI:  Laura Combs is a 41 y.o. who identifies as a female who was assigned female at birth.   Social history: Lives with: husband and 2 kids Work history: Engineer, site   Comes in today for follow up of the following chronic medical issues:  1. Primary hypertension No c/o chest pain, sob or headache. Does not check blood pressure at home. BP Readings from Last 3 Encounters:  02/15/22 126/89  08/20/21 133/89  11/16/20 (!) 143/94     2. Primary insomnia Is on ambien to sleep and is not able to sleep without it.  3. GAD (generalized anxiety disorder) Is on lexapro and buspar. Combination works well for her.    02/15/2022    4:20 PM 08/20/2021    3:28 PM 04/01/2021   12:47 PM 11/16/2020    4:00 PM  GAD 7 : Generalized Anxiety Score  Nervous, Anxious, on Edge 0 0 3 0  Control/stop worrying 0 0 3 0  Worry too much - different things 0 0 3 0  Trouble relaxing 0 0 3 0  Restless 0 0 1 0  Easily annoyed or irritable 0 0 3 0  Afraid - awful might happen 0 0 0 0  Total GAD 7 Score 0 0 16 0  Anxiety Difficulty Not difficult at all  Somewhat difficult Not difficult at all       02/15/2022    4:19 PM 08/20/2021    3:27 PM 04/01/2021   12:46 PM  Depression screen PHQ 2/9  Decreased Interest 0 0 1  Down, Depressed, Hopeless 0 0 1  PHQ - 2 Score 0 0 2  Altered sleeping 0 0 0  Tired, decreased energy 0 0 0  Change in appetite 0 0 0  Feeling bad or failure about yourself  0 0 0  Trouble concentrating 0 0 1  Moving slowly or fidgety/restless 0 0 0  Suicidal thoughts 0 0 0  PHQ-9 Score 0 0 3  Difficult doing work/chores Not difficult at all  Somewhat difficult     4. Menstrual migraine without status migrainosus, not intractable Not every month  5. Abnormal liver function Lab Results  Component Value Date    ALT 11 08/20/2021   AST 14 08/20/2021   ALKPHOS 76 08/20/2021   BILITOT 0.4 08/20/2021     6. Morbid obesity (HCC) Weight down 6lbs Wt Readings from Last 3 Encounters:  02/15/22 152 lb (68.9 kg)  08/20/21 158 lb (71.7 kg)  11/16/20 192 lb (87.1 kg)   BMI Readings from Last 3 Encounters:  02/15/22 27.80 kg/m  08/20/21 28.90 kg/m  11/16/20 35.12 kg/m     New complaints: None today  No Known Allergies Outpatient Encounter Medications as of 02/15/2022  Medication Sig   busPIRone (BUSPAR) 5 MG tablet TAKE ONE TABLET BY MOUTH TWICE DAILY   cetirizine (ZYRTEC) 10 MG tablet Take 10 mg by mouth at bedtime.    escitalopram (LEXAPRO) 20 MG tablet Take 1 tablet (20 mg total) by mouth daily.   lisinopril (ZESTRIL) 20 MG tablet TAKE ONE TABLET BY MOUTH DAILY   pantoprazole (PROTONIX) 40 MG tablet Take 1 tablet (40 mg total) by mouth daily.   SUMAtriptan (IMITREX) 100 MG tablet TAKE ONE TABLET AT ONSET OF HEADACHE, MAY REPEAT IN 2 HOURS IF  HEADACHE PERSISTS   zolpidem (AMBIEN) 10 MG tablet Take 1 tablet (10 mg total) by mouth at bedtime as needed. for sleep   No facility-administered encounter medications on file as of 02/15/2022.    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   Eyes:  Negative for pain.  Respiratory:  Negative for shortness of breath.   Cardiovascular:  Negative for chest pain, palpitations and leg swelling.  Gastrointestinal:  Negative for abdominal pain.  Endocrine: Negative for polydipsia.  Skin:  Negative for rash.  Neurological:  Negative for dizziness, weakness and headaches.  Hematological:  Does not bruise/bleed easily.  All other systems reviewed and are negative.      Objective:   Physical Exam Vitals and nursing note reviewed.  Constitutional:      General: She is not in acute distress.    Appearance: Normal appearance. She is well-developed.  HENT:     Head: Normocephalic.     Right Ear: Tympanic membrane normal.     Left Ear: Tympanic membrane normal.     Nose: Nose normal.     Mouth/Throat:     Mouth: Mucous membranes are moist.  Eyes:     Pupils: Pupils are equal, round, and reactive to light.  Neck:     Vascular: No carotid bruit or JVD.  Cardiovascular:     Rate and Rhythm: Normal rate and regular rhythm.     Heart sounds: Normal heart sounds.  Pulmonary:     Effort: Pulmonary effort is normal. No respiratory distress.     Breath sounds: Normal breath sounds. No wheezing or rales.  Chest:     Chest wall: No tenderness.  Abdominal:     General: Bowel sounds are normal. There is no distension or abdominal bruit.     Palpations: Abdomen is soft. There is no hepatomegaly, splenomegaly, mass or pulsatile mass.     Tenderness: There is no abdominal tenderness.  Musculoskeletal:        General: Normal range of motion.     Cervical back: Normal range of motion and neck supple.  Lymphadenopathy:     Cervical: No cervical adenopathy.  Skin:    General: Skin is warm and dry.  Neurological:     Mental Status: She is alert and oriented to person, place, and time.     Deep Tendon Reflexes: Reflexes are normal and symmetric.  Psychiatric:        Behavior: Behavior normal.        Thought Content: Thought content  normal.        Judgment: Judgment normal.     BP 126/89   Pulse 65   Temp 97.7 F (36.5 C) (Temporal)   Resp 20   Ht  5\' 2"  (1.575 m)   Wt 152 lb (68.9 kg)   SpO2 100%   BMI 27.80 kg/m        Assessment & Plan:  Laura Combs comes in today with chief complaint of Medical Management of Chronic Issues   Diagnosis and orders addressed:  1. Primary hypertension Low sodium diet - lisinopril (ZESTRIL) 20 MG tablet; Take 1 tablet (20 mg total) by mouth daily.  Dispense: 90 tablet; Refill: 1  2. Primary insomnia Bedtime routine - zolpidem (AMBIEN) 10 MG tablet; Take 1 tablet (10 mg total) by mouth at bedtime as needed. for sleep  Dispense: 30 tablet; Refill: 5  3. GAD (generalized anxiety disorder) Stress management - busPIRone (BUSPAR) 5 MG tablet; Take 1 tablet (5 mg total) by mouth 2 (two) times daily.  Dispense: 60 tablet; Refill: 5 - escitalopram (LEXAPRO) 20 MG tablet; Take 1 tablet (20 mg total) by mouth daily.  Dispense: 90 tablet; Refill: 1  4. Menstrual migraine without status migrainosus, not intractable Avoid caffeine  5. Abnormal liver function Labs pending  6. BMI 27.0-27.9 (HCC) Discussed diet and exercise for person with BMI >25 Will recheck weight in 3-6 months   7. Gastroesophageal reflux disease without esophagitis Avoid spicy foods Do not eat 2 hours prior to bedtime  - pantoprazole (PROTONIX) 40 MG tablet; Take 1 tablet (40 mg total) by mouth daily.  Dispense: 90 tablet; Refill: 1   Labs pending Health Maintenance reviewed Diet and exercise encouraged  Follow up plan: 6 months   Mary-Margaret 08-07-1968, FNP

## 2022-02-16 LAB — CBC WITH DIFFERENTIAL/PLATELET
Basophils Absolute: 0 10*3/uL (ref 0.0–0.2)
Basos: 1 %
EOS (ABSOLUTE): 0.1 10*3/uL (ref 0.0–0.4)
Eos: 1 %
Hematocrit: 40.6 % (ref 34.0–46.6)
Hemoglobin: 13.6 g/dL (ref 11.1–15.9)
Immature Grans (Abs): 0 10*3/uL (ref 0.0–0.1)
Immature Granulocytes: 0 %
Lymphocytes Absolute: 2.8 10*3/uL (ref 0.7–3.1)
Lymphs: 48 %
MCH: 31.1 pg (ref 26.6–33.0)
MCHC: 33.5 g/dL (ref 31.5–35.7)
MCV: 93 fL (ref 79–97)
Monocytes Absolute: 0.3 10*3/uL (ref 0.1–0.9)
Monocytes: 6 %
Neutrophils Absolute: 2.5 10*3/uL (ref 1.4–7.0)
Neutrophils: 44 %
Platelets: 163 10*3/uL (ref 150–450)
RBC: 4.38 x10E6/uL (ref 3.77–5.28)
RDW: 11.9 % (ref 11.7–15.4)
WBC: 5.7 10*3/uL (ref 3.4–10.8)

## 2022-02-16 LAB — CMP14+EGFR
ALT: 13 IU/L (ref 0–32)
AST: 17 IU/L (ref 0–40)
Albumin/Globulin Ratio: 2.2 (ref 1.2–2.2)
Albumin: 4 g/dL (ref 3.9–4.9)
Alkaline Phosphatase: 63 IU/L (ref 44–121)
BUN/Creatinine Ratio: 12 (ref 9–23)
BUN: 8 mg/dL (ref 6–20)
Bilirubin Total: 0.4 mg/dL (ref 0.0–1.2)
CO2: 21 mmol/L (ref 20–29)
Calcium: 8.8 mg/dL (ref 8.7–10.2)
Chloride: 106 mmol/L (ref 96–106)
Creatinine, Ser: 0.67 mg/dL (ref 0.57–1.00)
Globulin, Total: 1.8 g/dL (ref 1.5–4.5)
Glucose: 79 mg/dL (ref 70–99)
Potassium: 4 mmol/L (ref 3.5–5.2)
Sodium: 143 mmol/L (ref 134–144)
Total Protein: 5.8 g/dL — ABNORMAL LOW (ref 6.0–8.5)
eGFR: 114 mL/min/{1.73_m2} (ref >59)

## 2022-02-16 LAB — LIPID PANEL
Chol/HDL Ratio: 2.5 ratio (ref 0.0–4.4)
Cholesterol, Total: 174 mg/dL (ref 100–199)
HDL: 70 mg/dL (ref >39)
LDL Chol Calc (NIH): 89 mg/dL (ref 0–99)
Triglycerides: 84 mg/dL (ref 0–149)
VLDL Cholesterol Cal: 15 mg/dL (ref 5–40)

## 2022-02-18 LAB — TOXASSURE SELECT 13 (MW), URINE

## 2022-04-22 ENCOUNTER — Other Ambulatory Visit: Payer: Self-pay | Admitting: Nurse Practitioner

## 2022-04-22 DIAGNOSIS — G43829 Menstrual migraine, not intractable, without status migrainosus: Secondary | ICD-10-CM

## 2022-07-08 ENCOUNTER — Telehealth (INDEPENDENT_AMBULATORY_CARE_PROVIDER_SITE_OTHER): Payer: BC Managed Care – PPO | Admitting: Family Medicine

## 2022-07-08 DIAGNOSIS — T3695XA Adverse effect of unspecified systemic antibiotic, initial encounter: Secondary | ICD-10-CM | POA: Diagnosis not present

## 2022-07-08 DIAGNOSIS — J209 Acute bronchitis, unspecified: Secondary | ICD-10-CM

## 2022-07-08 DIAGNOSIS — B379 Candidiasis, unspecified: Secondary | ICD-10-CM

## 2022-07-08 MED ORDER — AMOXICILLIN-POT CLAVULANATE 875-125 MG PO TABS: 1.0000 | ORAL_TABLET | Freq: Two times a day (BID) | ORAL | 0 refills | Status: DC

## 2022-07-08 MED ORDER — PREDNISONE 20 MG PO TABS: 40.0000 mg | ORAL_TABLET | Freq: Every day | ORAL | 0 refills | Status: AC

## 2022-07-08 MED ORDER — FLUCONAZOLE 150 MG PO TABS: ORAL_TABLET | ORAL | 0 refills | Status: DC

## 2022-07-08 MED ORDER — HYDROCODONE BIT-HOMATROP MBR 5-1.5 MG/5ML PO SOLN: 5.0000 mL | Freq: Three times a day (TID) | ORAL | 0 refills | Status: DC | PRN

## 2022-07-08 NOTE — Progress Notes (Unsigned)
Virtual Visit via video Note   Due to COVID-19 pandemic this visit was conducted virtually. This visit type was conducted due to national recommendations for restrictions regarding the COVID-19 Pandemic (e.g. social distancing, sheltering in place) in an effort to limit this patient's exposure and mitigate transmission in our community. All issues noted in this document were discussed and addressed.  A physical exam was not performed with this format.  I connected with  Laura Combs  on 07/08/22 at 1306 by video and verified that I am speaking with the correct person using two identifiers. Laura Combs is currently located at work and no one is currently with her during the visit. The provider, Gwenlyn Perking, FNP is located in their office at time of visit.  I discussed the limitations, risks, security and privacy concerns of performing an evaluation and management service by video  and the availability of in person appointments. I also discussed with the patient that there may be a patient responsible charge related to this service. The patient expressed understanding and agreed to proceed.  CC: cough  History and Present Illness:  Cough: Patient complains of nonproductive cough.  Symptoms began 2 weeks ago.  The cough is non-productive, without wheezing, dyspnea or hemoptysis, harsh, worsening over time and is aggravated by  activity, talking, laughing.  Associated symptoms include: sore throat . Patient does not have new pets. Patient does not have a history of asthma. Patient does have a history of environmental allergens. Patient does not have recent travel. Patient does not have a history of smoking. There is a history of bronchitis. She has tried mucinex, delsym, tessalon perles, theraflu, and cough drops without improvement.     ROS Negative unless specially indicated above in HPI.  Observations/Objective: Alert and oriented. Respirations unlabored. No cyanosis. Non toxic  appearing. Normal mood and behavior. Frequent coughing noted.    Assessment and Plan: Laura Combs was seen today for cough.  Diagnoses and all orders for this visit:  Acute bronchitis, unspecified organism Prednisone burst as below. Will treat empirically with augmentin. Hycodan prn. PDMP reviewed, no red flags. Discussed symptomatic care and return precautions.  -     amoxicillin-clavulanate (AUGMENTIN) 875-125 MG tablet; Take 1 tablet by mouth 2 (two) times daily for 7 days. -     HYDROcodone bit-homatropine (HYCODAN) 5-1.5 MG/5ML syrup; Take 5 mLs by mouth every 8 (eight) hours as needed for cough. -     predniSONE (DELTASONE) 20 MG tablet; Take 2 tablets (40 mg total) by mouth daily with breakfast for 5 days.  Antibiotic-induced yeast infection -     fluconazole (DIFLUCAN) 150 MG tablet; Take one tablet by mouth. May repeat in 3 days if symptoms persist.     Follow Up Instructions: As needed.     I discussed the assessment and treatment plan with the patient. The patient was provided an opportunity to ask questions and all were answered. The patient agreed with the plan and demonstrated an understanding of the instructions.   The patient was advised to call back or seek an in-person evaluation if the symptoms worsen or if the condition fails to improve as anticipated.  The above assessment and management plan was discussed with the patient. The patient verbalized understanding of and has agreed to the management plan. Patient is aware to call the clinic if symptoms persist or worsen. Patient is aware when to return to the clinic for a follow-up visit. Patient educated on when it is appropriate to go  to the emergency department.   Time call ended:1311  I provided 6 minutes of face-to-face time during this encounter.    Gwenlyn Perking, FNP

## 2022-07-11 ENCOUNTER — Encounter: Payer: Self-pay | Admitting: Family Medicine

## 2022-07-12 ENCOUNTER — Encounter: Payer: Self-pay | Admitting: Family Medicine

## 2022-07-12 ENCOUNTER — Other Ambulatory Visit: Payer: Self-pay | Admitting: Family Medicine

## 2022-07-12 MED ORDER — BENZONATATE 100 MG PO CAPS: 100.0000 mg | ORAL_CAPSULE | Freq: Three times a day (TID) | ORAL | 0 refills | Status: DC | PRN

## 2022-07-13 ENCOUNTER — Telehealth: Payer: Self-pay | Admitting: *Deleted

## 2022-07-13 NOTE — Telephone Encounter (Signed)
Patient contacted [facility] stating that tessalon pearls are not helping her cough.  She states hydromet has helped in the past and is asking for a script.  She was seen 01/05 virtual visit with Marjorie Smolder.

## 2022-07-13 NOTE — Telephone Encounter (Signed)
Appointment given.

## 2022-07-13 NOTE — Telephone Encounter (Signed)
Hydromet is a controlled med because it has codiene in it. Has to have a face to face visit to get that

## 2022-07-14 ENCOUNTER — Encounter: Payer: Self-pay | Admitting: Nurse Practitioner

## 2022-07-14 ENCOUNTER — Telehealth (INDEPENDENT_AMBULATORY_CARE_PROVIDER_SITE_OTHER): Payer: BC Managed Care – PPO | Admitting: Nurse Practitioner

## 2022-07-14 DIAGNOSIS — J209 Acute bronchitis, unspecified: Secondary | ICD-10-CM | POA: Diagnosis not present

## 2022-07-14 DIAGNOSIS — R051 Acute cough: Secondary | ICD-10-CM

## 2022-07-14 MED ORDER — HYDROCODONE BIT-HOMATROP MBR 5-1.5 MG/5ML PO SOLN: 5.0000 mL | Freq: Three times a day (TID) | ORAL | 0 refills | Status: DC | PRN

## 2022-07-14 NOTE — Progress Notes (Signed)
Virtual Visit Consent   Laura Combs, you are scheduled for a virtual visit with Laura Hassell Done, FNP, a [facility] provider, today.     Just as with appointments in the office, your consent must be obtained to participate.  Your consent will be active for this visit and any virtual visit you may have with one of our providers in the next 365 days.     If you have a MyChart account, a copy of this consent can be sent to you electronically.  All virtual visits are billed to your insurance company just like a traditional visit in the office.    As this is a virtual visit, video technology does not allow for your provider to perform a traditional examination.  This may limit your provider's ability to fully assess your condition.  If your provider identifies any concerns that need to be evaluated in person or the need to arrange testing (such as labs, EKG, etc.), we will make arrangements to do so.     Although advances in technology are sophisticated, we cannot ensure that it will always work on either your end or our end.  If the connection with a video visit is poor, the visit may have to be switched to a telephone visit.  With either a video or telephone visit, we are not always able to ensure that we have a secure connection.     I need to obtain your verbal consent now.   Are you willing to proceed with your visit today? YES   Laura Combs has provided verbal consent on 07/14/2022 for a virtual visit (video or telephone).   Laura Hassell Done, FNP   Date: 07/14/2022 10:39 AM   Virtual Visit via Video Note   I, Laura Combs, connected with Laura Combs ([id number], 02-23-1981) on 07/14/22 at 12:30 PM EST by a video-enabled telemedicine application and verified that I am speaking with the correct person using two identifiers.  Location: Patient: Virtual Visit Location Patient: Home Provider: Virtual Visit Location Provider: Mobile   I discussed the limitations  of evaluation and management by telemedicine and the availability of in person appointments. The patient expressed understanding and agreed to proceed.    History of Present Illness: Laura Combs is a 42 y.o. who identifies as a female who was assigned female at birth, and is being seen today for cough.  HPI: Dx with bronchitis last week. Needs more cough meds  Cough This is a new problem. The current episode started 1 to 4 weeks ago. The problem occurs constantly. The cough is Productive of sputum. Associated symptoms include nasal congestion. Pertinent negatives include no shortness of breath. Nothing aggravates the symptoms. She has tried OTC cough suppressant for the symptoms. The treatment provided mild relief.    Review of Systems  Respiratory:  Positive for cough. Negative for shortness of breath.     Problems:  Patient Active Problem List   Diagnosis Date Noted   BMI 27.0-27.9,adult 02/15/2022   Gastroesophageal reflux disease without esophagitis 02/15/2022   Insomnia 11/16/2020   Morbid obesity ([place]) 01/28/2020   Abnormal liver function 05/22/2019   Hypertensive disorder 02/08/2017   GAD (generalized anxiety disorder) 03/02/2016   Migraine 09/28/2015    Allergies: No Known Allergies Medications:  Current Outpatient Medications:    amoxicillin-clavulanate (AUGMENTIN) 875-125 MG tablet, Take 1 tablet by mouth 2 (two) times daily for 7 days., Disp: 14 tablet, Rfl: 0   benzonatate (TESSALON PERLES) 100 MG capsule, Take 1 capsule (100  mg total) by mouth 3 (three) times daily as needed., Disp: 20 capsule, Rfl: 0   busPIRone (BUSPAR) 5 MG tablet, Take 1 tablet (5 mg total) by mouth 2 (two) times daily., Disp: 60 tablet, Rfl: 5   cetirizine (ZYRTEC) 10 MG tablet, Take 10 mg by mouth at bedtime. , Disp: , Rfl:    escitalopram (LEXAPRO) 20 MG tablet, Take 1 tablet (20 mg total) by mouth daily., Disp: 90 tablet, Rfl: 1   fluconazole (DIFLUCAN) 150 MG tablet, Take one tablet by  mouth. May repeat in 3 days if symptoms persist., Disp: 2 tablet, Rfl: 0   HYDROcodone bit-homatropine (HYCODAN) 5-1.5 MG/5ML syrup, Take 5 mLs by mouth every 8 (eight) hours as needed for cough., Disp: 120 mL, Rfl: 0   lisinopril (ZESTRIL) 20 MG tablet, Take 1 tablet (20 mg total) by mouth daily., Disp: 90 tablet, Rfl: 1   pantoprazole (PROTONIX) 40 MG tablet, Take 1 tablet (40 mg total) by mouth daily., Disp: 90 tablet, Rfl: 1   SUMAtriptan (IMITREX) 100 MG tablet, TAKE ONE TABLET AT ONSET OF HEADACHE, MAY REPEAT IN 2 HOURS IF HEADACHE PERSISTS, Disp: 30 tablet, Rfl: 1   zolpidem (AMBIEN) 10 MG tablet, Take 1 tablet (10 mg total) by mouth at bedtime as needed. for sleep, Disp: 30 tablet, Rfl: 5  Observations/Objective: Patient is well-developed, well-nourished in no acute distress.  Resting comfortably  at home.  Head is normocephalic, atraumatic.  No labored breathing.  Speech is clear and coherent with logical content.  Patient is alert and oriented at baseline.  Deep dry cough  Assessment and Plan:  Laura Combs in today with chief complaint of Cough   1. Acute cough 2. Acute bronchitis, unspecified organism 1. Take meds as prescribed 2. Use a cool mist humidifier especially during the winter months and when heat has been humid. 3. Use saline nose sprays frequently 4. Saline irrigations of the nose can be very helpful if done frequently.  * 4X daily for 1 week*  * Use of a nettie pot can be helpful with this. Follow directions with this* 5. Drink plenty of fluids 6. Keep thermostat turn down low 7.For any cough or congestion- hycodan 8. For fever or aces or pains- take tylenol or ibuprofen appropriate for age and weight.  * for fevers greater than 101 orally you may alternate ibuprofen and tylenol every  3 hours.    - HYDROcodone bit-homatropine (HYCODAN) 5-1.5 MG/5ML syrup; Take 5 mLs by mouth every 8 (eight) hours as needed for cough.  Dispense: 120 mL; Refill:  0     Follow Up Instructions: I discussed the assessment and treatment plan with the patient. The patient was provided an opportunity to ask questions and all were answered. The patient agreed with the plan and demonstrated an understanding of the instructions.  A copy of instructions were sent to the patient via MyChart.  The patient was advised to call back or seek an in-person evaluation if the symptoms worsen or if the condition fails to improve as anticipated.  Time:  I spent 9 minutes with the patient via telehealth technology discussing the above problems/concerns.    Laura Hassell Done, FNP

## 2022-07-14 NOTE — Patient Instructions (Signed)

## 2022-08-24 ENCOUNTER — Other Ambulatory Visit: Payer: Self-pay | Admitting: Nurse Practitioner

## 2022-08-24 DIAGNOSIS — K219 Gastro-esophageal reflux disease without esophagitis: Secondary | ICD-10-CM

## 2022-08-24 DIAGNOSIS — F411 Generalized anxiety disorder: Secondary | ICD-10-CM

## 2022-08-24 DIAGNOSIS — I10 Essential (primary) hypertension: Secondary | ICD-10-CM

## 2022-08-26 ENCOUNTER — Encounter (HOSPITAL_COMMUNITY): Payer: Self-pay | Admitting: *Deleted

## 2022-09-02 ENCOUNTER — Other Ambulatory Visit: Payer: Self-pay | Admitting: Nurse Practitioner

## 2022-09-02 DIAGNOSIS — F5101 Primary insomnia: Secondary | ICD-10-CM

## 2022-09-05 ENCOUNTER — Other Ambulatory Visit: Payer: Self-pay | Admitting: Nurse Practitioner

## 2022-09-05 ENCOUNTER — Encounter: Payer: Self-pay | Admitting: Nurse Practitioner

## 2022-09-05 ENCOUNTER — Ambulatory Visit: Payer: BC Managed Care – PPO | Admitting: Nurse Practitioner

## 2022-09-05 VITALS — BP 138/91 | HR 74 | Temp 98.1°F | Resp 20 | Ht 62.0 in | Wt 150.0 lb

## 2022-09-05 DIAGNOSIS — F411 Generalized anxiety disorder: Secondary | ICD-10-CM

## 2022-09-05 DIAGNOSIS — F5101 Primary insomnia: Secondary | ICD-10-CM

## 2022-09-05 DIAGNOSIS — G43829 Menstrual migraine, not intractable, without status migrainosus: Secondary | ICD-10-CM

## 2022-09-05 MED ORDER — BUSPIRONE HCL 10 MG PO TABS: 10.0000 mg | ORAL_TABLET | Freq: Two times a day (BID) | ORAL | 2 refills | Status: DC

## 2022-09-05 MED ORDER — ZOLPIDEM TARTRATE 10 MG PO TABS: 10.0000 mg | ORAL_TABLET | Freq: Every evening | ORAL | 5 refills | Status: DC | PRN

## 2022-09-05 NOTE — Addendum Note (Signed)
Addended by: Chevis Pretty on: 09/05/2022 04:18 PM   Modules accepted: Orders

## 2022-09-05 NOTE — Progress Notes (Signed)
Subjective:    Patient ID: Laura Combs, female    DOB: 23-Oct-1980, 42 y.o.   MRN: [id number]   Chief Complaint: Anxiety   Anxiety Patient reports no chest pain, dizziness, palpitations or shortness of breath.     Patinet come sin c/o anxiety. She says that she thinks she has been having panic attacks, usually in the afternoons an evenings mainly. She says she gets sob and cries. She is on lexapro daily and has buspar to take. The buspar was helping but not now.      09/05/2022    3:59 PM 02/15/2022    4:20 PM 08/20/2021    3:28 PM 04/01/2021   12:47 PM  GAD 7 : Generalized Anxiety Score  Nervous, Anxious, on Edge 2 0 0 3  Control/stop worrying 2 0 0 3  Worry too much - different things 2 0 0 3  Trouble relaxing 2 0 0 3  Restless 0 0 0 1  Easily annoyed or irritable 3 0 0 3  Afraid - awful might happen 0 0 0 0  Total GAD 7 Score 11 0 0 16  Anxiety Difficulty Somewhat difficult Not difficult at all  Somewhat difficult       09/05/2022    3:59 PM 02/15/2022    4:19 PM 08/20/2021    3:27 PM  Depression screen PHQ 2/9  Decreased Interest 1 0 0  Down, Depressed, Hopeless 0 0 0  PHQ - 2 Score 1 0 0  Altered sleeping 2 0 0  Tired, decreased energy 2 0 0  Change in appetite 0 0 0  Feeling bad or failure about yourself  1 0 0  Trouble concentrating 1 0 0  Moving slowly or fidgety/restless 0 0 0  Suicidal thoughts 0 0 0  PHQ-9 Score 7 0 0  Difficult doing work/chores Somewhat difficult Not difficult at all     Patient Active Problem List   Diagnosis Date Noted   BMI 27.0-27.9,adult 02/15/2022   Gastroesophageal reflux disease without esophagitis 02/15/2022   Insomnia 11/16/2020   Morbid obesity ([place]) 01/28/2020   Abnormal liver function 05/22/2019   Hypertensive disorder 02/08/2017   GAD (generalized anxiety disorder) 03/02/2016   Migraine 09/28/2015       Review of Systems  Constitutional:  Negative for diaphoresis.  Eyes:  Negative for pain.  Respiratory:   Negative for shortness of breath.   Cardiovascular:  Negative for chest pain, palpitations and leg swelling.  Gastrointestinal:  Negative for abdominal pain.  Endocrine: Negative for polydipsia.  Skin:  Negative for rash.  Neurological:  Negative for dizziness, weakness and headaches.  Hematological:  Does not bruise/bleed easily.  All other systems reviewed and are negative.      Objective:   Physical Exam Vitals and nursing note reviewed.  Constitutional:      General: She is not in acute distress.    Appearance: Normal appearance. She is well-developed.  Neck:     Vascular: No carotid bruit or JVD.  Cardiovascular:     Rate and Rhythm: Normal rate and regular rhythm.     Heart sounds: Normal heart sounds.  Pulmonary:     Effort: Pulmonary effort is normal. No respiratory distress.     Breath sounds: Normal breath sounds. No wheezing or rales.  Chest:     Chest wall: No tenderness.  Abdominal:     General: Bowel sounds are normal. There is no distension or abdominal bruit.     Palpations: Abdomen is  soft. There is no hepatomegaly, splenomegaly, mass or pulsatile mass.     Tenderness: There is no abdominal tenderness.  Musculoskeletal:        General: Normal range of motion.     Cervical back: Normal range of motion and neck supple.  Lymphadenopathy:     Cervical: No cervical adenopathy.  Skin:    General: Skin is warm and dry.  Neurological:     General: No focal deficit present.     Mental Status: She is alert and oriented to person, place, and time.     Deep Tendon Reflexes: Reflexes are normal and symmetric.  Psychiatric:        Behavior: Behavior normal.        Thought Content: Thought content normal.        Judgment: Judgment normal.    BP (!) 138/91   Pulse 74   Temp 98.1 F (36.7 C) (Temporal)   Resp 20   Ht '5\' 2"'$  (1.575 m)   Wt 150 lb (68 kg)   SpO2 100%   BMI 27.44 kg/m         Assessment & Plan:   Laura Combs in today with chief  complaint of Anxiety   1. GAD (generalized anxiety disorder) Stress management Increase buspar from '5mg'$  to 10- patient will let me know if helps - busPIRone (BUSPAR) 10 MG tablet; Take 1 tablet (10 mg total) by mouth 2 (two) times daily.  Dispense: 30 tablet; Refill: 2    The above assessment and management plan was discussed with the patient. The patient verbalized understanding of and has agreed to the management plan. Patient is aware to call the clinic if symptoms persist or worsen. Patient is aware when to return to the clinic for a follow-up visit. Patient educated on when it is appropriate to go to the emergency department.   Mary-Margaret Hassell Done, FNP

## 2022-09-28 ENCOUNTER — Other Ambulatory Visit: Payer: Self-pay | Admitting: Nurse Practitioner

## 2022-09-28 DIAGNOSIS — I10 Essential (primary) hypertension: Secondary | ICD-10-CM

## 2022-09-28 DIAGNOSIS — K219 Gastro-esophageal reflux disease without esophagitis: Secondary | ICD-10-CM

## 2022-09-28 DIAGNOSIS — G43829 Menstrual migraine, not intractable, without status migrainosus: Secondary | ICD-10-CM

## 2022-09-28 DIAGNOSIS — F411 Generalized anxiety disorder: Secondary | ICD-10-CM

## 2022-10-03 ENCOUNTER — Telehealth: Payer: BC Managed Care – PPO | Admitting: Family

## 2022-10-03 ENCOUNTER — Encounter: Payer: Self-pay | Admitting: Family

## 2022-10-03 DIAGNOSIS — J029 Acute pharyngitis, unspecified: Secondary | ICD-10-CM | POA: Diagnosis not present

## 2022-10-03 MED ORDER — AMOXICILLIN 500 MG PO CAPS: 500.0000 mg | ORAL_CAPSULE | Freq: Two times a day (BID) | ORAL | 0 refills | Status: AC

## 2022-10-03 NOTE — Progress Notes (Signed)
Virtual Visit Consent   Laura Combs, you are scheduled for a virtual visit with a [facility] provider today. Just as with appointments in the office, your consent must be obtained to participate. Your consent will be active for this visit and any virtual visit you may have with one of our providers in the next 365 days. If you have a MyChart account, a copy of this consent can be sent to you electronically.  As this is a virtual visit, video technology does not allow for your provider to perform a traditional examination. This may limit your provider's ability to fully assess your condition. If your provider identifies any concerns that need to be evaluated in person or the need to arrange testing (such as labs, EKG, etc.), we will make arrangements to do so. Although advances in technology are sophisticated, we cannot ensure that it will always work on either your end or our end. If the connection with a video visit is poor, the visit may have to be switched to a telephone visit. With either a video or telephone visit, we are not always able to ensure that we have a secure connection.  By engaging in this virtual visit, you consent to the provision of healthcare and authorize for your insurance to be billed (if applicable) for the services provided during this visit. Depending on your insurance coverage, you may receive a charge related to this service.  I need to obtain your verbal consent now. Are you willing to proceed with your visit today? Laura Combs has provided verbal consent on 10/03/2022 for a virtual visit (video or telephone). Laura Dun, FNP  Date: 10/03/2022 10:58 AM  Virtual Visit via Video Note   I, Laura Combs, connected with  Laura Combs  ([id number], [birth year]) on 10/03/22 at  5:00 PM EDT by a video-enabled telemedicine application and verified that I am speaking with the correct person using two identifiers.  Location: Patient: Virtual Visit Location Patient:  Home Provider: Virtual Visit Location Provider: Office/Clinic   I discussed the limitations of evaluation and management by telemedicine and the availability of in person appointments. The patient expressed understanding and agreed to proceed.    History of Present Illness: Laura Combs is a 42 y.o. who identifies as a female who was assigned female at birth, and is being seen today for sore throat.  HPI: Sore Throat  This is a new problem. The current episode started in the past 7 days. The problem has been gradually worsening. The maximum temperature recorded prior to her arrival was 101 - 101.9 F. The pain is at a severity of 8/10. The pain is mild. Associated symptoms include congestion, ear pain (right), headaches, swollen glands and trouble swallowing. Pertinent negatives include no coughing. Associated symptoms comments: White patches on tonsil. She has had exposure to strep. She has tried acetaminophen for the symptoms. The treatment provided mild relief.    Problems:  Patient Active Problem List   Diagnosis Date Noted   BMI 27.0-27.9,adult 02/15/2022   Gastroesophageal reflux disease without esophagitis 02/15/2022   Insomnia 11/16/2020   Morbid obesity 01/28/2020   Abnormal liver function 05/22/2019   Hypertensive disorder 02/08/2017   GAD (generalized anxiety disorder) 03/02/2016   Migraine 09/28/2015    Allergies: No Known Allergies Medications:  Current Outpatient Medications:    amoxicillin (AMOXIL) 500 MG capsule, Take 1 capsule (500 mg total) by mouth 2 (two) times daily for 10 days., Disp: 20 capsule, Rfl: 0   busPIRone (BUSPAR) 10 MG  tablet, Take 1 tablet (10 mg total) by mouth 2 (two) times daily., Disp: 30 tablet, Rfl: 2   escitalopram (LEXAPRO) 20 MG tablet, Take 1 tablet (20 mg total) by mouth daily., Disp: 30 tablet, Rfl: 2   lisinopril (ZESTRIL) 20 MG tablet, TAKE ONE TABLET ONCE DAILY, Disp: 30 tablet, Rfl: 0   pantoprazole (PROTONIX) 40 MG tablet, TAKE ONE  TABLET ONCE DAILY, Disp: 30 tablet, Rfl: 0   SUMAtriptan (IMITREX) 100 MG tablet, TAKE ONE TABLET AT ONSET OF HEADACHE, MAY REPEAT IN 2 HOURS IF HEADACHE PERSISTS, Disp: 30 tablet, Rfl: 0   zolpidem (AMBIEN) 10 MG tablet, Take 1 tablet (10 mg total) by mouth at bedtime as needed. for sleep, Disp: 30 tablet, Rfl: 5  Observations/Objective: Patient is well-developed, well-nourished in no acute distress.  Resting comfortably  at home.  Head is normocephalic, atraumatic.  No labored breathing.  Speech is clear and coherent with logical content.  Patient is alert and oriented at baseline.  Tonsils erythemas and mild swelling  Assessment and Plan: 1. Acute pharyngitis, unspecified etiology - amoxicillin (AMOXIL) 500 MG capsule; Take 1 capsule (500 mg total) by mouth 2 (two) times daily for 10 days.  Dispense: 20 capsule; Refill: 0  - Take meds as prescribed - Use a cool mist humidifier  -Use saline nose sprays frequently -Force fluids -For any cough or congestion  Use plain Mucinex- regular strength or max strength is fine -For fever or aces or pains- take tylenol or ibuprofen. -Throat lozenges if help -New toothbrush in 3 days   Follow Up Instructions: I discussed the assessment and treatment plan with the patient. The patient was provided an opportunity to ask questions and all were answered. The patient agreed with the plan and demonstrated an understanding of the instructions.  A copy of instructions were sent to the patient via MyChart unless otherwise noted below.     The patient was advised to call back or seek an in-person evaluation if the symptoms worsen or if the condition fails to improve as anticipated.  Time:  I spent 8 minutes with the patient via telehealth technology discussing the above problems/concerns.    Laura Dun, FNP

## 2022-10-29 ENCOUNTER — Other Ambulatory Visit: Payer: Self-pay | Admitting: Nurse Practitioner

## 2022-10-29 DIAGNOSIS — F411 Generalized anxiety disorder: Secondary | ICD-10-CM

## 2022-10-29 DIAGNOSIS — K219 Gastro-esophageal reflux disease without esophagitis: Secondary | ICD-10-CM

## 2022-10-29 DIAGNOSIS — I10 Essential (primary) hypertension: Secondary | ICD-10-CM

## 2022-11-18 ENCOUNTER — Other Ambulatory Visit: Payer: Self-pay | Admitting: Nurse Practitioner

## 2022-11-18 DIAGNOSIS — F411 Generalized anxiety disorder: Secondary | ICD-10-CM

## 2022-11-18 NOTE — Telephone Encounter (Signed)
TC to pt, the 10 mg is working well for her Last OV 09/05/22 no RTC Next OV NOT sched Rx for BID #30

## 2022-12-21 ENCOUNTER — Other Ambulatory Visit: Payer: Self-pay | Admitting: Nurse Practitioner

## 2022-12-21 DIAGNOSIS — F411 Generalized anxiety disorder: Secondary | ICD-10-CM

## 2022-12-21 DIAGNOSIS — G43829 Menstrual migraine, not intractable, without status migrainosus: Secondary | ICD-10-CM

## 2022-12-27 ENCOUNTER — Encounter: Payer: Self-pay | Admitting: Family Medicine

## 2022-12-27 ENCOUNTER — Ambulatory Visit: Payer: BC Managed Care – PPO | Admitting: Family Medicine

## 2022-12-27 VITALS — BP 113/75 | HR 68 | Temp 98.5°F | Ht 62.0 in | Wt 148.0 lb

## 2022-12-27 DIAGNOSIS — M26609 Unspecified temporomandibular joint disorder, unspecified side: Secondary | ICD-10-CM

## 2022-12-27 MED ORDER — METHOCARBAMOL 750 MG PO TABS
750.0000 mg | ORAL_TABLET | Freq: Four times a day (QID) | ORAL | 0 refills | Status: AC
Start: 2022-12-27 — End: 2022-12-30

## 2022-12-27 MED ORDER — KETOROLAC TROMETHAMINE 60 MG/2ML IM SOLN
60.0000 mg | Freq: Once | INTRAMUSCULAR | Status: AC
Start: 2022-12-27 — End: 2022-12-27
  Administered 2022-12-27: 60 mg via INTRAMUSCULAR

## 2022-12-27 MED ORDER — NAPROXEN 500 MG PO TABS
500.0000 mg | ORAL_TABLET | Freq: Two times a day (BID) | ORAL | 0 refills | Status: DC
Start: 2022-12-27 — End: 2023-03-14

## 2022-12-27 NOTE — Patient Instructions (Addendum)
Methocarbamol may cause CNS effects, including (but not limited to) dizziness and drowsiness. Confusion, sedation, falls. Recommend not taking ambien while taking methocarbamol   Start Naproxen 6/26

## 2022-12-27 NOTE — Progress Notes (Signed)
Acute Office Visit  Subjective:  Patient ID: Laura Combs, female    DOB: 11-16-1980, 42 y.o.   MRN: [id number]  Chief Complaint  Patient presents with   Temporomandibular Joint Pain   HPI Patient is in today for "severe" TMJ  States that it started about 1.5 weeks ago and is worsening. States that she grinds her teeth at night. She has a nightguard. States that she went to the dentist and they said for her to wait it out. She has tried warm compresses, cold compresses, relaxation techniques, and tried pushing on her jaw. Tried different sleeping positions. Has tried tyelenol and motrin. States that she took gabapentin inconsistently. States that the pain is waking her up at night from sleep. Last advil was 800 mg this am. States that it is making her nauseous and sick.   ROS As per HPI  Objective:  BP 113/75   Pulse 68   Temp 98.5 F (36.9 C)   Ht 5\' 2"  (1.575 m)   Wt 148 lb (67.1 kg)   SpO2 99%   BMI 27.07 kg/m   Physical Exam Constitutional:      General: She is awake. She is not in acute distress.    Appearance: Normal appearance. She is well-developed and well-groomed. She is not ill-appearing, toxic-appearing or diaphoretic.  Cardiovascular:     Rate and Rhythm: Normal rate.     Pulses: Normal pulses.          Radial pulses are 2+ on the right side and 2+ on the left side.       Posterior tibial pulses are 2+ on the right side and 2+ on the left side.     Heart sounds: Normal heart sounds. No murmur heard.    No gallop.  Pulmonary:     Effort: Pulmonary effort is normal. No respiratory distress.     Breath sounds: Normal breath sounds. No stridor. No wheezing, rhonchi or rales.  Musculoskeletal:     Cervical back: Full passive range of motion without pain and neck supple.     Right lower leg: No edema.     Left lower leg: No edema.     Comments: Limited ROM of TMJ. No crepitus or clicking. Unable to Jaw thrust due to pain  Skin:    General: Skin is warm.      Capillary Refill: Capillary refill takes less than 2 seconds.  Neurological:     General: No focal deficit present.     Mental Status: She is alert, oriented to person, place, and time and easily aroused. Mental status is at baseline.     GCS: GCS eye subscore is 4. GCS verbal subscore is 5. GCS motor subscore is 6.     Motor: No weakness.  Psychiatric:        Attention and Perception: Attention and perception normal.        Mood and Affect: Mood and affect normal.        Speech: Speech normal.        Behavior: Behavior normal. Behavior is cooperative.        Thought Content: Thought content normal. Thought content does not include homicidal or suicidal ideation. Thought content does not include homicidal or suicidal plan.        Cognition and Memory: Cognition and memory normal.        Judgment: Judgment normal.       12/27/2022    2:17 PM 09/05/2022    3:59 PM  02/15/2022    4:19 PM  Depression screen PHQ 2/9  Decreased Interest 0 1 0  Down, Depressed, Hopeless 0 0 0  PHQ - 2 Score 0 1 0  Altered sleeping 0 2 0  Tired, decreased energy 0 2 0  Change in appetite 0 0 0  Feeling bad or failure about yourself  0 1 0  Trouble concentrating 0 1 0  Moving slowly or fidgety/restless 0 0 0  Suicidal thoughts 0 0 0  PHQ-9 Score 0 7 0  Difficult doing work/chores Not difficult at all Somewhat difficult Not difficult at all      12/27/2022    2:17 PM 09/05/2022    3:59 PM 02/15/2022    4:20 PM 08/20/2021    3:28 PM  GAD 7 : Generalized Anxiety Score  Nervous, Anxious, on Edge 0 2 0 0  Control/stop worrying 0 2 0 0  Worry too much - different things 0 2 0 0  Trouble relaxing 0 2 0 0  Restless 0 0 0 0  Easily annoyed or irritable 0 3 0 0  Afraid - awful might happen 0 0 0 0  Total GAD 7 Score 0 11 0 0  Anxiety Difficulty Not difficult at all Somewhat difficult Not difficult at all    Assessment & Plan:  Angy was seen today for temporomandibular joint pain.  Diagnoses and all  orders for this visit:  TMJ (temporomandibular joint syndrome) Toradol injection provided today. Discussed with patient to not take any additional NSAIDs today. Instructed patient to start Naproxen tomorrow. Reviewed CMP from 02/15/22. GFR within normal range to receive NSAID treatment. Discussed side effects of robaxin with patient. Encouraged her to avoid with ambien. Instructed patient to continue to wear nightguard, continue warm compresses, and continue stretches. Discussed with patient that if this is a chronic/recurrent issue may pursue injection therapy with specialty.  -     naproxen (NAPROSYN) 500 MG tablet; Take 1 tablet (500 mg total) by mouth 2 (two) times daily with a meal. -     methocarbamol (ROBAXIN-750) 750 MG tablet; Take 1 tablet (750 mg total) by mouth 4 (four) times daily for 3 days. -     ketorolac (TORADOL) injection 60 mg  The above assessment and management plan was discussed with the patient. The patient verbalized understanding of and has agreed to the management plan using shared-decision making. Patient is aware to call the clinic if they develop any new symptoms or if symptoms fail to improve or worsen. Patient is aware when to return to the clinic for a follow-up visit. Patient educated on when it is appropriate to go to the emergency department.   Return if symptoms worsen or fail to improve.  Neale Burly, DNP-FNP [facility] [address] [place], [place] [address] [phone]

## 2023-01-17 ENCOUNTER — Other Ambulatory Visit: Payer: Self-pay | Admitting: Nurse Practitioner

## 2023-01-17 DIAGNOSIS — F411 Generalized anxiety disorder: Secondary | ICD-10-CM

## 2023-01-17 DIAGNOSIS — G43829 Menstrual migraine, not intractable, without status migrainosus: Secondary | ICD-10-CM

## 2023-01-19 ENCOUNTER — Telehealth: Payer: Self-pay | Admitting: Nurse Practitioner

## 2023-01-19 NOTE — Telephone Encounter (Signed)
I cannot prescribe medsin [place]- you may can get local pharmacy to transfer meds there.

## 2023-01-19 NOTE — Telephone Encounter (Signed)
Patient aware and verbalizes understanding. 

## 2023-02-16 ENCOUNTER — Other Ambulatory Visit: Payer: Self-pay | Admitting: Nurse Practitioner

## 2023-02-16 DIAGNOSIS — F411 Generalized anxiety disorder: Secondary | ICD-10-CM

## 2023-02-20 NOTE — Telephone Encounter (Signed)
We can change it to [place] if insurance will cover that. Check with insurance prior  to making appointment.

## 2023-02-23 ENCOUNTER — Other Ambulatory Visit: Payer: Self-pay | Admitting: Nurse Practitioner

## 2023-02-23 DIAGNOSIS — F5101 Primary insomnia: Secondary | ICD-10-CM

## 2023-02-24 ENCOUNTER — Telehealth: Payer: Self-pay | Admitting: Nurse Practitioner

## 2023-02-24 ENCOUNTER — Other Ambulatory Visit: Payer: Self-pay | Admitting: Nurse Practitioner

## 2023-02-24 DIAGNOSIS — F5101 Primary insomnia: Secondary | ICD-10-CM

## 2023-02-24 MED ORDER — ZOLPIDEM TARTRATE 10 MG PO TABS
10.0000 mg | ORAL_TABLET | Freq: Every evening | ORAL | 0 refills | Status: DC | PRN
Start: 2023-02-24 — End: 2023-03-14

## 2023-02-24 NOTE — Telephone Encounter (Signed)
30 day supply sent to pharmacy by Laura Combs.  Patient aware.

## 2023-02-24 NOTE — Telephone Encounter (Signed)
  Prescription Request  02/24/2023  Is this a "Controlled Substance" medicine? yes  Have you seen your PCP in the last 2 weeks? no  If YES, route message to pool  -  If NO, patient needs to be scheduled for appointment.  What is the name of the medication or equipment? Zolpidem 10 mg - Patient has appt 9-10 and needs enough to get her through  Have you contacted your pharmacy to request a refill? YES   Which pharmacy would you like this sent to? [facility]   Patient notified that their request is being sent to the clinical staff for review and that they should receive a response within 2 business days.

## 2023-03-14 ENCOUNTER — Ambulatory Visit: Payer: BC Managed Care – PPO | Admitting: Nurse Practitioner

## 2023-03-14 ENCOUNTER — Encounter: Payer: Self-pay | Admitting: Nurse Practitioner

## 2023-03-14 VITALS — BP 132/87 | HR 76 | Temp 97.7°F | Resp 20 | Ht 62.0 in | Wt 149.0 lb

## 2023-03-14 DIAGNOSIS — K219 Gastro-esophageal reflux disease without esophagitis: Secondary | ICD-10-CM

## 2023-03-14 DIAGNOSIS — F5101 Primary insomnia: Secondary | ICD-10-CM

## 2023-03-14 DIAGNOSIS — I10 Essential (primary) hypertension: Secondary | ICD-10-CM

## 2023-03-14 DIAGNOSIS — Z23 Encounter for immunization: Secondary | ICD-10-CM | POA: Diagnosis not present

## 2023-03-14 DIAGNOSIS — Z6827 Body mass index (BMI) 27.0-27.9, adult: Secondary | ICD-10-CM

## 2023-03-14 DIAGNOSIS — F411 Generalized anxiety disorder: Secondary | ICD-10-CM | POA: Diagnosis not present

## 2023-03-14 MED ORDER — ZOLPIDEM TARTRATE ER 12.5 MG PO TBCR: 12.5000 mg | EXTENDED_RELEASE_TABLET | Freq: Every evening | ORAL | 0 refills | Status: DC | PRN

## 2023-03-14 MED ORDER — BUSPIRONE HCL 10 MG PO TABS
10.0000 mg | ORAL_TABLET | Freq: Every day | ORAL | 3 refills | Status: DC | PRN
Start: 2023-03-14 — End: 2023-06-30

## 2023-03-14 MED ORDER — ESCITALOPRAM OXALATE 20 MG PO TABS
20.0000 mg | ORAL_TABLET | Freq: Every day | ORAL | 1 refills | Status: DC
Start: 2023-03-14 — End: 2023-07-28

## 2023-03-14 MED ORDER — PANTOPRAZOLE SODIUM 40 MG PO TBEC
40.0000 mg | DELAYED_RELEASE_TABLET | Freq: Every day | ORAL | 4 refills | Status: DC
Start: 2023-03-14 — End: 2023-07-28

## 2023-03-14 MED ORDER — LISINOPRIL 20 MG PO TABS
20.0000 mg | ORAL_TABLET | Freq: Every day | ORAL | 4 refills | Status: DC
Start: 2023-03-14 — End: 2023-07-28

## 2023-03-14 NOTE — Progress Notes (Signed)
Subjective:    Patient ID: Laura Combs, female    DOB: 04/06/1981, 42 y.o.   MRN: [id number]   Chief Complaint: Medical Management of Chronic Issues    HPI:  Laura Combs is a 42 y.o. who identifies as a female who was assigned female at birth.   Social history: Lives with: husband and kids Work history: Engineer, site   Comes in today for follow up of the following chronic medical issues:  1. Primary hypertension No c/o chest pain, sob or headache. Does not check blood pressure at home. BP Readings from Last 3 Encounters:  03/14/23 132/87  12/27/22 113/75  09/05/22 (!) 138/91     2. Gastroesophageal reflux disease without esophagitis Is on protonix and is doing well  3. GAD (generalized anxiety disorder) Is on buspar as needed.    03/14/2023    3:34 PM 12/27/2022    2:17 PM 09/05/2022    3:59 PM 02/15/2022    4:20 PM  GAD 7 : Generalized Anxiety Score  Nervous, Anxious, on Edge 0 0 2 0  Control/stop worrying 0 0 2 0  Worry too much - different things 0 0 2 0  Trouble relaxing 0 0 2 0  Restless 0 0 0 0  Easily annoyed or irritable 0 0 3 0  Afraid - awful might happen 0 0 0 0  Total GAD 7 Score 0 0 11 0  Anxiety Difficulty Not difficult at all Not difficult at all Somewhat difficult Not difficult at all      4. Primary insomnia Is on ambien 10mg  and she says it is not working well for her anymore. It will help her fall to sleep but she wakes up a lot during the night.  5. BMI 27.0-27.9,adult No recent weight changes Wt Readings from Last 3 Encounters:  03/14/23 149 lb (67.6 kg)  12/27/22 148 lb (67.1 kg)  09/05/22 150 lb (68 kg)   BMI Readings from Last 3 Encounters:  03/14/23 27.25 kg/m  12/27/22 27.07 kg/m  09/05/22 27.44 kg/m      New complaints: None today  No Known Allergies Outpatient Encounter Medications as of 03/14/2023  Medication Sig   busPIRone (BUSPAR) 10 MG tablet TAKE ONE TABLET UP TO TWICE DAILY   escitalopram  (LEXAPRO) 20 MG tablet TAKE ONE TABLET ONCE DAILY   gabapentin (NEURONTIN) 300 MG capsule Take by mouth.   lisinopril (ZESTRIL) 20 MG tablet TAKE ONE TABLET ONCE DAILY   NUVARING 0.12-0.015 MG/24HR vaginal ring INSERT ONE RING VAGINALLY ONCE A MONTH   pantoprazole (PROTONIX) 40 MG tablet TAKE ONE TABLET ONCE DAILY   SUMAtriptan (IMITREX) 100 MG tablet TAKE ONE TABLET AT ONSET OF HEADACHE, MAY REPEAT IN 2 HOURS IF HEADACHE PERSISTS   zolpidem (AMBIEN) 10 MG tablet Take 1 tablet (10 mg total) by mouth at bedtime as needed. for sleep   [DISCONTINUED] naproxen (NAPROSYN) 500 MG tablet Take 1 tablet (500 mg total) by mouth 2 (two) times daily with a meal.   No facility-administered encounter medications on file as of 03/14/2023.    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  Eyes:  Negative for pain.  Respiratory:  Negative for shortness of breath.   Cardiovascular:  Negative for chest pain, palpitations and leg swelling.  Gastrointestinal:  Negative for abdominal pain.  Endocrine: Negative for polydipsia.  Skin:  Negative for rash.  Neurological:  Negative for dizziness, weakness and headaches.  Hematological:  Does not bruise/bleed easily.  All other systems reviewed and are  negative.      Objective:   Physical Exam Vitals and nursing note reviewed.  Constitutional:      General: She is not in acute distress.    Appearance: Normal appearance. She is well-developed.  Neck:     Vascular: No carotid bruit or JVD.  Cardiovascular:     Rate and Rhythm: Normal rate and regular rhythm.     Heart sounds: Normal heart sounds.  Pulmonary:     Effort: Pulmonary effort is normal. No respiratory distress.     Breath sounds: Normal breath sounds. No wheezing or rales.  Chest:     Chest wall: No tenderness.  Abdominal:     General: Bowel sounds are normal. There is no distension or abdominal bruit.     Palpations: Abdomen is soft. There is no hepatomegaly, splenomegaly, mass or pulsatile mass.     Tenderness: There is no abdominal tenderness.  Musculoskeletal:        General: Normal range of motion.     Cervical back: Normal range of motion and neck supple.  Lymphadenopathy:     Cervical: No cervical adenopathy.  Skin:    General: Skin is warm and dry.  Neurological:     Mental Status: She is alert and oriented to person, place, and time.     Deep Tendon Reflexes: Reflexes are normal and symmetric.  Psychiatric:        Behavior: Behavior normal.        Thought Content: Thought content normal.        Judgment: Judgment normal.     BP 132/87   Pulse 76   Temp 97.7 F (36.5 C) (Temporal)   Resp 20   Ht 5\' 2"  (1.575 m)   Wt 149 lb (67.6 kg)   SpO2 100%   BMI 27.25 kg/m        Assessment & Plan:   Mely Stolzman comes in today with chief complaint of Medical Management of Chronic Issues   Diagnosis and orders addressed:  1. Primary hypertension Low sodium diet - lisinopril (ZESTRIL) 20 MG tablet; Take 1 tablet (20 mg total) by mouth daily.  Dispense: 30 tablet; Refill: 4 - CBC with Differential/Platelet - CMP14+EGFR - Lipid panel  2. Gastroesophageal reflux disease without esophagitis Avoid spicy foods Do not eat 2 hours prior to  bedtime - pantoprazole (PROTONIX) 40 MG tablet; Take 1 tablet (40 mg total) by mouth daily.  Dispense: 30 tablet; Refill: 4  3. GAD (generalized anxiety disorder) Stress management - busPIRone (BUSPAR) 10 MG tablet; Take 1 tablet (10 mg total) by mouth daily as needed.  Dispense: 30 tablet; Refill: 3 - escitalopram (LEXAPRO) 20 MG tablet; Take 1 tablet (20 mg total) by mouth daily.  Dispense: 90 tablet; Refill: 1  4. Primary insomnia Bedtime routine  5. BMI 27.0-27.9,adult Discussed diet and exercise for person with BMI >25 Will recheck weight in 3-6 months    Labs pending Health Maintenance reviewed Diet and exercise encouraged  Follow up plan: 6 months   Mary-Margaret Daphine Deutscher, FNP

## 2023-03-14 NOTE — Patient Instructions (Signed)
Insomnia Insomnia is a sleep disorder that makes it difficult to fall asleep or stay asleep. Insomnia can cause fatigue, low energy, difficulty concentrating, mood swings, and poor performance at work or school. There are three different ways to classify insomnia: Difficulty falling asleep. Difficulty staying asleep. Waking up too early in the morning. Any type of insomnia can be long-term (chronic) or short-term (acute). Both are common. Short-term insomnia usually lasts for 3 months or less. Chronic insomnia occurs at least three times a week for longer than 3 months. What are the causes? Insomnia may be caused by another condition, situation, or substance, such as: Having certain mental health conditions, such as anxiety and depression. Using caffeine, alcohol, tobacco, or drugs. Having gastrointestinal conditions, such as gastroesophageal reflux disease (GERD). Having certain medical conditions. These include: Asthma. Alzheimer's disease. Stroke. Chronic pain. An overactive thyroid gland (hyperthyroidism). Other sleep disorders, such as restless legs syndrome and sleep apnea. Menopause. Sometimes, the cause of insomnia may not be known. What increases the risk? Risk factors for insomnia include: Gender. Females are affected more often than males. Age. Insomnia is more common as people get older. Stress and certain medical and mental health conditions. Lack of exercise. Having an irregular work schedule. This may include working night shifts and traveling between different time zones. What are the signs or symptoms? If you have insomnia, the main symptom is having trouble falling asleep or having trouble staying asleep. This may lead to other symptoms, such as: Feeling tired or having low energy. Feeling nervous about going to sleep. Not feeling rested in the morning. Having trouble concentrating. Feeling irritable, anxious, or depressed. How is this diagnosed? This condition  may be diagnosed based on: Your symptoms and medical history. Your health care provider may ask about: Your sleep habits. Any medical conditions you have. Your mental health. A physical exam. How is this treated? Treatment for insomnia depends on the cause. Treatment may focus on treating an underlying condition that is causing the insomnia. Treatment may also include: Medicines to help you sleep. Counseling or therapy. Lifestyle adjustments to help you sleep better. Follow these instructions at home: Eating and drinking  Limit or avoid alcohol, caffeinated beverages, and products that contain nicotine and tobacco, especially close to bedtime. These can disrupt your sleep. Do not eat a large meal or eat spicy foods right before bedtime. This can lead to digestive discomfort that can make it hard for you to sleep. Sleep habits  Keep a sleep diary to help you and your health care provider figure out what could be causing your insomnia. Write down: When you sleep. When you wake up during the night. How well you sleep and how rested you feel the next day. Any side effects of medicines you are taking. What you eat and drink. Make your bedroom a dark, comfortable place where it is easy to fall asleep. Put up shades or blackout curtains to block light from outside. Use a white noise machine to block noise. Keep the temperature cool. Limit screen use before bedtime. This includes: Not watching TV. Not using your smartphone, tablet, or computer. Stick to a routine that includes going to bed and waking up at the same times every day and night. This can help you fall asleep faster. Consider making a quiet activity, such as reading, part of your nighttime routine. Try to avoid taking naps during the day so that you sleep better at night. Get out of bed if you are still awake after   15 minutes of trying to sleep. Keep the lights down, but try reading or doing a quiet activity. When you feel  sleepy, go back to bed. General instructions Take over-the-counter and prescription medicines only as told by your health care provider. Exercise regularly as told by your health care provider. However, avoid exercising in the hours right before bedtime. Use relaxation techniques to manage stress. Ask your health care provider to suggest some techniques that may work well for you. These may include: Breathing exercises. Routines to release muscle tension. Visualizing peaceful scenes. Make sure that you drive carefully. Do not drive if you feel very sleepy. Keep all follow-up visits. This is important. Contact a health care provider if: You are tired throughout the day. You have trouble in your daily routine due to sleepiness. You continue to have sleep problems, or your sleep problems get worse. Get help right away if: You have thoughts about hurting yourself or someone else. Get help right away if you feel like you may hurt yourself or others, or have thoughts about taking your own life. Go to your nearest emergency room or: Call 911. Call the National Suicide Prevention Lifeline at 1-800-273-8255 or 988. This is open 24 hours a day. Text the Crisis Text Line at 741741. Summary Insomnia is a sleep disorder that makes it difficult to fall asleep or stay asleep. Insomnia can be long-term (chronic) or short-term (acute). Treatment for insomnia depends on the cause. Treatment may focus on treating an underlying condition that is causing the insomnia. Keep a sleep diary to help you and your health care provider figure out what could be causing your insomnia. This information is not intended to replace advice given to you by your health care provider. Make sure you discuss any questions you have with your health care provider. Document Revised: 05/31/2021 Document Reviewed: 05/31/2021 Elsevier Patient Education  2024 Elsevier Inc.  

## 2023-03-15 LAB — CBC WITH DIFFERENTIAL/PLATELET
Basophils Absolute: 0 10*3/uL (ref 0.0–0.2)
Basos: 0 %
EOS (ABSOLUTE): 0.1 10*3/uL (ref 0.0–0.4)
Eos: 1 %
Hematocrit: 39.4 % (ref 34.0–46.6)
Hemoglobin: 13 g/dL (ref 11.1–15.9)
Immature Grans (Abs): 0 10*3/uL (ref 0.0–0.1)
Immature Granulocytes: 0 %
Lymphocytes Absolute: 3.2 10*3/uL — ABNORMAL HIGH (ref 0.7–3.1)
Lymphs: 47 %
MCH: 31.3 pg (ref 26.6–33.0)
MCHC: 33 g/dL (ref 31.5–35.7)
MCV: 95 fL (ref 79–97)
Monocytes Absolute: 0.4 10*3/uL (ref 0.1–0.9)
Monocytes: 6 %
Neutrophils Absolute: 3.2 10*3/uL (ref 1.4–7.0)
Neutrophils: 46 %
Platelets: 206 10*3/uL (ref 150–450)
RBC: 4.15 x10E6/uL (ref 3.77–5.28)
RDW: 12 % (ref 11.7–15.4)
WBC: 6.9 10*3/uL (ref 3.4–10.8)

## 2023-03-15 LAB — LIPID PANEL
Chol/HDL Ratio: 2.4 ratio (ref 0.0–4.4)
Cholesterol, Total: 178 mg/dL (ref 100–199)
HDL: 74 mg/dL (ref >39)
LDL Chol Calc (NIH): 90 mg/dL (ref 0–99)
Triglycerides: 77 mg/dL (ref 0–149)
VLDL Cholesterol Cal: 14 mg/dL (ref 5–40)

## 2023-03-15 LAB — CMP14+EGFR
ALT: 14 IU/L (ref 0–32)
AST: 17 IU/L (ref 0–40)
Albumin: 3.9 g/dL (ref 3.9–4.9)
Alkaline Phosphatase: 59 IU/L (ref 44–121)
BUN/Creatinine Ratio: 14 (ref 9–23)
BUN: 11 mg/dL (ref 6–24)
Bilirubin Total: <0.2 mg/dL (ref 0.0–1.2)
CO2: 21 mmol/L (ref 20–29)
Calcium: 8.6 mg/dL — ABNORMAL LOW (ref 8.7–10.2)
Chloride: 107 mmol/L — ABNORMAL HIGH (ref 96–106)
Creatinine, Ser: 0.76 mg/dL (ref 0.57–1.00)
Globulin, Total: 2.1 g/dL (ref 1.5–4.5)
Glucose: 89 mg/dL (ref 70–99)
Potassium: 4.3 mmol/L (ref 3.5–5.2)
Sodium: 141 mmol/L (ref 134–144)
Total Protein: 6 g/dL (ref 6.0–8.5)
eGFR: 102 mL/min/{1.73_m2} (ref >59)

## 2023-03-17 ENCOUNTER — Telehealth: Payer: Self-pay

## 2023-03-17 ENCOUNTER — Other Ambulatory Visit (HOSPITAL_COMMUNITY): Payer: Self-pay

## 2023-03-17 NOTE — Telephone Encounter (Signed)
Pharmacy Patient Advocate Encounter   Received notification from CoverMyMeds that prior authorization for Zolpidem Tartrate ER 12.5MG  er tablets is required/requested.   Insurance verification completed.   The patient is insured through [facility] .   Per test claim: PA required; PA started via CoverMyMeds. KEY [id number] . Waiting for clinical questions to populate.

## 2023-03-17 NOTE — Telephone Encounter (Signed)
Please call patient and find out if she got the new abien meds. Message from prior auth is confusing

## 2023-03-17 NOTE — Telephone Encounter (Signed)
Pharmacy Patient Advocate Encounter  Received notification from [facility] that Prior Authorization for Zolpidem Tartrate ER 12.5MG  er tablets has been CANCELLED due to: Your PA has been resolved, no additional PA is required   Per test claim: insurance will only allow 45 tablets in 75 days. Last fill date 02/24/23, next available fill 05/10/23

## 2023-03-20 NOTE — Telephone Encounter (Signed)
Called and spoke to patient she had the 10mg  filled on 02/24/2023. Dose for Ambien was changed to 12.5mg   on 03/14/2023 and that is the one that has not been filled.

## 2023-03-20 NOTE — Telephone Encounter (Signed)
Can a PA be done on the new dose?

## 2023-03-21 ENCOUNTER — Other Ambulatory Visit (HOSPITAL_COMMUNITY): Payer: Self-pay

## 2023-03-21 NOTE — Telephone Encounter (Signed)
Called and spoke with patient. Notified what PA team said and patient verbalized understanding

## 2023-03-21 NOTE — Telephone Encounter (Signed)
PA was previously submitted for Zolpidem Tartrate ER 12.5 MG tablets, this was cancelled by the insurance as it is currently covered without a PA. The issue is that there is a quantity limit for what the insurance is willing to pay for, insurance will cover 45 tablets every 75 days. Since pt picked up zolpidem 10 mg 02/24/2023, insurance will only cover the remaining 15 tablets (15 tablets of zolpidem 12.5mg  ER, would be $5.00).   Hopefully this helps to clarify what's going on, ultimately, medication is covered for both strengths, but quantity limits apply.

## 2023-03-24 ENCOUNTER — Other Ambulatory Visit: Payer: Self-pay | Admitting: Nurse Practitioner

## 2023-03-24 DIAGNOSIS — F5101 Primary insomnia: Secondary | ICD-10-CM

## 2023-04-07 MED ORDER — ZOLPIDEM TARTRATE 10 MG PO TABS: 10.0000 mg | ORAL_TABLET | Freq: Every evening | ORAL | 2 refills | Status: DC | PRN

## 2023-06-02 ENCOUNTER — Other Ambulatory Visit: Payer: Self-pay | Admitting: Nurse Practitioner

## 2023-06-02 DIAGNOSIS — G43829 Menstrual migraine, not intractable, without status migrainosus: Secondary | ICD-10-CM

## 2023-06-29 ENCOUNTER — Other Ambulatory Visit: Payer: Self-pay | Admitting: Nurse Practitioner

## 2023-06-29 DIAGNOSIS — F411 Generalized anxiety disorder: Secondary | ICD-10-CM

## 2023-07-04 ENCOUNTER — Other Ambulatory Visit: Payer: Self-pay | Admitting: Nurse Practitioner

## 2023-07-06 MED ORDER — ZOLPIDEM TARTRATE 10 MG PO TABS: 10.0000 mg | ORAL_TABLET | Freq: Every evening | ORAL | 1 refills | Status: DC | PRN

## 2023-07-14 ENCOUNTER — Ambulatory Visit: Payer: Self-pay | Admitting: Nurse Practitioner

## 2023-07-25 ENCOUNTER — Ambulatory Visit: Payer: Self-pay | Admitting: Nurse Practitioner

## 2023-07-26 ENCOUNTER — Other Ambulatory Visit: Payer: Self-pay | Admitting: Obstetrics and Gynecology

## 2023-07-26 DIAGNOSIS — R928 Other abnormal and inconclusive findings on diagnostic imaging of breast: Secondary | ICD-10-CM

## 2023-07-28 ENCOUNTER — Other Ambulatory Visit: Payer: Self-pay | Admitting: Nurse Practitioner

## 2023-07-28 ENCOUNTER — Ambulatory Visit: Payer: 59 | Admitting: Nurse Practitioner

## 2023-07-28 ENCOUNTER — Encounter: Payer: Self-pay | Admitting: Nurse Practitioner

## 2023-07-28 VITALS — BP 138/88 | HR 68 | Temp 97.1°F | Ht 62.0 in | Wt 150.0 lb

## 2023-07-28 DIAGNOSIS — G43829 Menstrual migraine, not intractable, without status migrainosus: Secondary | ICD-10-CM

## 2023-07-28 DIAGNOSIS — F411 Generalized anxiety disorder: Secondary | ICD-10-CM

## 2023-07-28 DIAGNOSIS — K219 Gastro-esophageal reflux disease without esophagitis: Secondary | ICD-10-CM

## 2023-07-28 DIAGNOSIS — I10 Essential (primary) hypertension: Secondary | ICD-10-CM

## 2023-07-28 DIAGNOSIS — R945 Abnormal results of liver function studies: Secondary | ICD-10-CM

## 2023-07-28 DIAGNOSIS — F5101 Primary insomnia: Secondary | ICD-10-CM

## 2023-07-28 MED ORDER — PANTOPRAZOLE SODIUM 40 MG PO TBEC: 40.0000 mg | DELAYED_RELEASE_TABLET | Freq: Every day | ORAL | 1 refills | Status: DC

## 2023-07-28 MED ORDER — LISINOPRIL 20 MG PO TABS: 20.0000 mg | ORAL_TABLET | Freq: Every day | ORAL | 1 refills | Status: DC

## 2023-07-28 MED ORDER — BUSPIRONE HCL 10 MG PO TABS: 10.0000 mg | ORAL_TABLET | Freq: Every day | ORAL | 5 refills | Status: DC | PRN

## 2023-07-28 MED ORDER — ESCITALOPRAM OXALATE 20 MG PO TABS: 20.0000 mg | ORAL_TABLET | Freq: Every day | ORAL | 1 refills | Status: DC

## 2023-07-28 MED ORDER — ZOLPIDEM TARTRATE 10 MG PO TABS: 10.0000 mg | ORAL_TABLET | Freq: Every evening | ORAL | 5 refills | Status: DC | PRN

## 2023-07-28 NOTE — Progress Notes (Signed)
Subjective:    Patient ID: Laura Combs, female    DOB: February 21, 1981, 43 y.o.   MRN: [id number]   Chief Complaint: medical management of chronic issues     HPI:  Laura Combs is a 43 y.o. who identifies as a female who was assigned female at birth.   Social history: Lives with: husband and 2 kids Work history: Engineer, site   Comes in today for follow up of the following chronic medical issues:  1. Primary hypertension No c/o chest pain, sob or headache. Does not check blood pressure at home. BP Readings from Last 3 Encounters:  03/14/23 132/87  12/27/22 113/75  09/05/22 (!) 138/91     2. Primary insomnia Is on ambien to sleep and is not able to sleep without it.  3. GAD (generalized anxiety disorder) Is on lexapro and buspar. Combination works well for her.     03/14/2023    3:34 PM 12/27/2022    2:17 PM 09/05/2022    3:59 PM 02/15/2022    4:20 PM  GAD 7 : Generalized Anxiety Score  Nervous, Anxious, on Edge 0 0 2 0  Control/stop worrying 0 0 2 0  Worry too much - different things 0 0 2 0  Trouble relaxing 0 0 2 0  Restless 0 0 0 0  Easily annoyed or irritable 0 0 3 0  Afraid - awful might happen 0 0 0 0  Total GAD 7 Score 0 0 11 0  Anxiety Difficulty Not difficult at all Not difficult at all Somewhat difficult Not difficult at all       07/28/2023    2:59 PM 03/14/2023    3:34 PM 12/27/2022    2:17 PM  Depression screen PHQ 2/9  Decreased Interest 0 0 0  Down, Depressed, Hopeless 0 0 0  PHQ - 2 Score 0 0 0  Altered sleeping  0 0  Tired, decreased energy  0 0  Change in appetite  0 0  Feeling bad or failure about yourself   0 0  Trouble concentrating  0 0  Moving slowly or fidgety/restless  0 0  Suicidal thoughts  0 0  PHQ-9 Score  0 0  Difficult doing work/chores  Not difficult at all Not difficult at all       4. Menstrual migraine without status migrainosus, not intractable Not every month  5. Abnormal liver function Lab Results   Component Value Date   ALT 14 03/14/2023   AST 17 03/14/2023   ALKPHOS 59 03/14/2023   BILITOT <0.2 03/14/2023     6. Morbid obesity (HCC) Weight down 6lbs  Wt Readings from Last 3 Encounters:  07/28/23 150 lb (68 kg)  03/14/23 149 lb (67.6 kg)  12/27/22 148 lb (67.1 kg)   BMI Readings from Last 3 Encounters:  07/28/23 27.44 kg/m  03/14/23 27.25 kg/m  12/27/22 27.07 kg/m      New complaints: None today  No Known Allergies Outpatient Encounter Medications as of 07/28/2023  Medication Sig   busPIRone (BUSPAR) 10 MG tablet TAKE ONE TABLET BY MOUTH DAILY AS NEEDED   escitalopram (LEXAPRO) 20 MG tablet Take 1 tablet (20 mg total) by mouth daily.   gabapentin (NEURONTIN) 300 MG capsule Take by mouth.   lisinopril (ZESTRIL) 20 MG tablet TAKE ONE TABLET BY MOUTH DAILY   NUVARING 0.12-0.015 MG/24HR vaginal ring INSERT ONE RING VAGINALLY ONCE A MONTH   pantoprazole (PROTONIX) 40 MG tablet TAKE ONE TABLET BY MOUTH DAILY  SUMAtriptan (IMITREX) 100 MG tablet TAKE ONE TABLET AT ONSET OF HEADACHE. MAY REPEAT IN 2 HOURS IF HEADACHE PERSISTS.   zolpidem (AMBIEN) 10 MG tablet Take 1 tablet (10 mg total) by mouth at bedtime as needed for sleep.   [DISCONTINUED] lisinopril (ZESTRIL) 20 MG tablet Take 1 tablet (20 mg total) by mouth daily.   [DISCONTINUED] pantoprazole (PROTONIX) 40 MG tablet Take 1 tablet (40 mg total) by mouth daily.   [DISCONTINUED] SUMAtriptan (IMITREX) 100 MG tablet TAKE ONE TABLET AT ONSET OF HEADACHE, MAY REPEAT IN 2 HOURS IF HEADACHE PERSISTS   No facility-administered encounter medications on file as of 07/28/2023.    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  Eyes:  Negative for pain.  Respiratory:  Negative for shortness of breath.   Cardiovascular:  Negative for chest pain, palpitations and leg swelling.  Gastrointestinal:  Negative for abdominal pain.  Endocrine: Negative for polydipsia.  Skin:  Negative for rash.  Neurological:  Negative for dizziness, weakness and headaches.  Hematological:  Does not bruise/bleed easily.  All other systems reviewed and are negative.      Objective:   Physical Exam Vitals and nursing note reviewed.  Constitutional:      General: She is not in acute distress.    Appearance: Normal appearance. She is well-developed.  HENT:     Head: Normocephalic.     Right Ear: Tympanic membrane normal.     Left Ear: Tympanic membrane normal.     Nose: Nose normal.     Mouth/Throat:     Mouth: Mucous membranes are moist.  Eyes:     Pupils: Pupils are equal, round, and reactive to light.  Neck:     Vascular: No carotid bruit or JVD.  Cardiovascular:     Rate and Rhythm: Normal rate and regular rhythm.     Heart sounds: Normal heart sounds.  Pulmonary:     Effort: Pulmonary effort is normal. No respiratory distress.     Breath sounds: Normal breath sounds. No wheezing or rales.  Chest:     Chest wall: No tenderness.  Abdominal:     General: Bowel sounds are normal. There is no distension or abdominal bruit.     Palpations: Abdomen is soft. There is no hepatomegaly, splenomegaly, mass or pulsatile mass.     Tenderness: There is no abdominal tenderness.  Musculoskeletal:        General: Normal  range of motion.     Cervical back: Normal range of motion and neck supple.  Lymphadenopathy:     Cervical: No cervical adenopathy.  Skin:    General: Skin is warm and dry.  Neurological:     Mental Status: She is alert and oriented to  person, place, and time.     Deep Tendon Reflexes: Reflexes are normal and symmetric.  Psychiatric:        Behavior: Behavior normal.        Thought Content: Thought content normal.        Judgment: Judgment normal.     BP 138/88   Pulse 68   Temp (!) 97.1 F (36.2 C) (Temporal)   Ht 5\' 2"  (1.575 m)   Wt 150 lb (68 kg)   SpO2 100%   BMI 27.44 kg/m         Assessment & Plan:  Laura Combs comes in today with chief complaint of Refill ambien   Diagnosis and orders addressed:  1. Primary hypertension (Primary) Low sodium diet - CBC with Differential/Platelet - CMP14+EGFR - Lipid panel - lisinopril (ZESTRIL) 20 MG tablet; Take 1 tablet (20 mg total) by mouth daily.  Dispense: 90 tablet; Refill: 1  2. Primary insomnia Bedtime routine - ToxASSURE Select 13 (MW), Urine - zolpidem (AMBIEN) 10 MG tablet; Take 1 tablet (10 mg total) by mouth at bedtime as needed for sleep.  Dispense: 30 tablet; Refill: 5  3. GAD (generalized anxiety disorder) Stress management - busPIRone (BUSPAR) 10 MG tablet; Take 1 tablet (10 mg total) by mouth daily as needed.  Dispense: 30 tablet; Refill: 5 - escitalopram (LEXAPRO) 20 MG tablet; Take 1 tablet (20 mg total) by mouth daily.  Dispense: 90 tablet; Refill: 1  4. Menstrual migraine without status migrainosus, not intractable   5. Abnormal liver function Labs pending  6. Gastroesophageal reflux disease without esophagitis Avoid spicy foods Do not eat 2 hours prior to bedtime  - pantoprazole (PROTONIX) 40 MG tablet; Take 1 tablet (40 mg total) by mouth daily.  Dispense: 90 tablet; Refill: 1   Labs pending Health Maintenance reviewed Diet and exercise encouraged  Follow up plan: 6  months   Mary-Margaret Daphine Deutscher, FNP

## 2023-07-28 NOTE — Patient Instructions (Signed)

## 2023-07-29 LAB — HM PAP SMEAR: HPV, high-risk: NEGATIVE

## 2023-08-05 ENCOUNTER — Other Ambulatory Visit: Payer: Self-pay | Admitting: Obstetrics and Gynecology

## 2023-08-05 ENCOUNTER — Ambulatory Visit
Admission: RE | Admit: 2023-08-05 | Discharge: 2023-08-05 | Disposition: A | Discharge status: Home / Self Care | Payer: 59 | Source: Ambulatory Visit | Attending: Obstetrics and Gynecology | Admitting: Obstetrics and Gynecology

## 2023-08-05 DIAGNOSIS — N6489 Other specified disorders of breast: Secondary | ICD-10-CM

## 2023-08-05 DIAGNOSIS — R928 Other abnormal and inconclusive findings on diagnostic imaging of breast: Secondary | ICD-10-CM

## 2023-08-09 ENCOUNTER — Telehealth (HOSPITAL_COMMUNITY): Payer: Self-pay

## 2023-08-09 NOTE — Telephone Encounter (Signed)
 Pharmacy Patient Advocate Encounter   Received notification from CoverMyMeds that prior authorization for Zolpidem  Tartrate 10MG  tablets is required/requested.   Insurance verification completed.   The patient is insured through [facility] .   Per test claim: PA required; PA started via CoverMyMeds. KEY [id number] . Waiting for clinical questions to populate.

## 2023-08-10 ENCOUNTER — Ambulatory Visit
Admission: RE | Admit: 2023-08-10 | Discharge: 2023-08-10 | Disposition: A | Discharge status: Home / Self Care | Payer: 59 | Source: Ambulatory Visit | Attending: Obstetrics and Gynecology | Admitting: Obstetrics and Gynecology

## 2023-08-10 ENCOUNTER — Other Ambulatory Visit (HOSPITAL_COMMUNITY): Payer: Self-pay

## 2023-08-10 DIAGNOSIS — N6489 Other specified disorders of breast: Secondary | ICD-10-CM

## 2023-08-10 HISTORY — PX: BREAST BIOPSY: SHX20

## 2023-08-10 NOTE — Telephone Encounter (Signed)
 Pharmacy Patient Advocate Encounter  Received notification from [facility] that Prior Authorization for Zolpidem  10 mg tablets has been APPROVED from 08/10/23 to 02/07/24 with QUANTITY LIMIT of 15 for a 30 day supply.   PA #/Case ID/Reference #: [id number]

## 2023-08-11 LAB — SURGICAL PATHOLOGY

## 2023-08-25 ENCOUNTER — Encounter (HOSPITAL_COMMUNITY): Payer: Self-pay | Admitting: *Deleted

## 2023-10-09 ENCOUNTER — Other Ambulatory Visit: Payer: Self-pay | Admitting: Nurse Practitioner

## 2023-10-09 DIAGNOSIS — G43829 Menstrual migraine, not intractable, without status migrainosus: Secondary | ICD-10-CM

## 2023-12-11 ENCOUNTER — Encounter: Payer: Self-pay | Admitting: Nurse Practitioner

## 2023-12-11 ENCOUNTER — Ambulatory Visit: Admitting: Nurse Practitioner

## 2023-12-11 VITALS — BP 121/86 | HR 48 | Temp 97.7°F | Ht 62.0 in | Wt 153.0 lb

## 2023-12-11 DIAGNOSIS — K219 Gastro-esophageal reflux disease without esophagitis: Secondary | ICD-10-CM

## 2023-12-11 DIAGNOSIS — Z6827 Body mass index (BMI) 27.0-27.9, adult: Secondary | ICD-10-CM

## 2023-12-11 DIAGNOSIS — F411 Generalized anxiety disorder: Secondary | ICD-10-CM | POA: Diagnosis not present

## 2023-12-11 DIAGNOSIS — F5101 Primary insomnia: Secondary | ICD-10-CM | POA: Diagnosis not present

## 2023-12-11 DIAGNOSIS — I10 Essential (primary) hypertension: Secondary | ICD-10-CM

## 2023-12-11 DIAGNOSIS — G43829 Menstrual migraine, not intractable, without status migrainosus: Secondary | ICD-10-CM

## 2023-12-11 MED ORDER — ZOLPIDEM TARTRATE 10 MG PO TABS: 10.0000 mg | ORAL_TABLET | Freq: Every evening | ORAL | 5 refills | Status: DC | PRN

## 2023-12-11 MED ORDER — SUMATRIPTAN SUCCINATE 100 MG PO TABS: 100.0000 mg | ORAL_TABLET | Freq: Once | ORAL | 1 refills | Status: DC

## 2023-12-11 MED ORDER — ESCITALOPRAM OXALATE 20 MG PO TABS: 20.0000 mg | ORAL_TABLET | Freq: Every day | ORAL | 1 refills | Status: DC

## 2023-12-11 MED ORDER — PANTOPRAZOLE SODIUM 40 MG PO TBEC: 40.0000 mg | DELAYED_RELEASE_TABLET | Freq: Every day | ORAL | 1 refills | Status: DC

## 2023-12-11 MED ORDER — LISINOPRIL 20 MG PO TABS: 20.0000 mg | ORAL_TABLET | Freq: Every day | ORAL | 1 refills | Status: DC

## 2023-12-11 NOTE — Patient Instructions (Signed)

## 2023-12-11 NOTE — Progress Notes (Signed)
 Subjective:    Patient ID: Laura Combs, female    DOB: 07/14/1980, 43 y.o.   MRN: [id number]   Chief Complaint: medical management of chronic issues     HPI:  Laura Combs is a 43 y.o. who identifies as a female who was assigned female at birth.   Social history: Lives with: husband and 2 kids Work history: Engineer, site   Comes in today for follow up of the following chronic medical issues:  1. Primary hypertension No c/o chest pain, sob or headache. Does not check blood pressure at home. BP Readings from Last 3 Encounters:  12/11/23 121/86  07/28/23 138/88  03/14/23 132/87     2. Primary insomnia Is on ambien  to sleep and is not able to sleep without it.  3. GAD (generalized anxiety disorder) Is on lexapro  and buspar . Combination works well for her.     12/11/2023    3:32 PM 03/14/2023    3:34 PM 12/27/2022    2:17 PM 09/05/2022    3:59 PM  GAD 7 : Generalized Anxiety Score  Nervous, Anxious, on Edge 0 0 0 2  Control/stop worrying 0 0 0 2  Worry too much - different things 0 0 0 2  Trouble relaxing 0 0 0 2  Restless 0 0 0 0  Easily annoyed or irritable 0 0 0 3  Afraid - awful might happen 0 0 0 0  Total GAD 7 Score 0 0 0 11  Anxiety Difficulty Not difficult at all Not difficult at all Not difficult at all Somewhat difficult       12/11/2023    3:32 PM 07/28/2023    2:59 PM 03/14/2023    3:34 PM  Depression screen PHQ 2/9  Decreased Interest 0 0 0  Down, Depressed, Hopeless 0 0 0  PHQ - 2 Score 0 0 0  Altered sleeping   0  Tired, decreased energy   0  Change in appetite   0  Feeling bad or failure about yourself    0  Trouble concentrating   0  Moving slowly or fidgety/restless   0  Suicidal thoughts   0  PHQ-9 Score   0  Difficult doing work/chores   Not difficult at all       4. Menstrual migraine without status migrainosus, not intractable Having at least 1 migraine a wee. Imitrex  works but is needing more often. Running out of imitrex   before the end of the  month.  5. Abnormal liver function Lab Results  Component Value Date   ALT 14 03/14/2023   AST 17 03/14/2023   ALKPHOS 59 03/14/2023   BILITOT <0.2 03/14/2023     6. Morbid obesity (HCC) Weight down 6lbs  Wt Readings from Last 3 Encounters:  12/11/23 153 lb (69.4 kg)  07/28/23 150 lb (68 kg)  03/14/23 149 lb (67.6 kg)   BMI Readings from Last 3 Encounters:  12/11/23 27.98 kg/m  07/28/23 27.44 kg/m  03/14/23 27.25 kg/m      New complaints: None today  No Known Allergies Outpatient Encounter Medications as of 12/11/2023  Medication Sig   busPIRone  (BUSPAR ) 10 MG tablet Take 1 tablet (10 mg total) by mouth daily as needed.   escitalopram  (LEXAPRO ) 20 MG tablet Take 1 tablet (20 mg total) by mouth daily.   lisinopril  (ZESTRIL ) 20 MG tablet Take 1 tablet (20 mg total) by mouth daily.   NUVARING 0.12-0.015 MG/24HR vaginal ring INSERT ONE RING VAGINALLY ONCE A MONTH  pantoprazole  (PROTONIX ) 40 MG tablet Take 1 tablet (40 mg total) by mouth daily.   SUMAtriptan  (IMITREX ) 100 MG tablet TAKE ONE TABLET AT ONSET OF HEADACHE. MAY REPEAT IN 2 HOURS IF HEADACHE PERSISTS.   zolpidem  (AMBIEN ) 10 MG tablet Take 1 tablet (10 mg total) by mouth at bedtime as needed for sleep.   No facility-administered encounter medications on file as of 12/11/2023.    Past Surgical History:  Procedure Laterality Date   BREAST BIOPSY Left 08/10/2023   MM LT BREAST BX W LOC DEV 1ST LESION IMAGE BX SPEC STEREO GUIDE 08/10/2023 [facility]   DILATION AND EVACUATION  11/03/2011   Procedure: DILATATION AND EVACUATION;  Surgeon: Hamp Levine, MD;  Location: [facility];  Service: Gynecology;  Laterality: N/A;   GASTRIC ROUX-EN-Y N/A 01/28/2020   Procedure: LAPAROSCOPIC ROUX-EN-Y GASTRIC BYPASS WITH UPPER ENDOSCOPY;  Surgeon: Adalberto Acton, MD;  Location: [facility];  Service: General;  Laterality: N/A;   TONSILLECTOMY      Family History  Problem Relation Age of Onset   Diabetes  Father    Hypertension Father    Hyperlipidemia Father    Diabetes Maternal Aunt    Diabetes Maternal Grandmother    Diabetes Maternal Grandfather       Controlled substance contract: n/a     Review of Systems  Constitutional:  Negative for diaphoresis.  Eyes:  Negative for pain.  Respiratory:  Negative for shortness of breath.   Cardiovascular:  Negative for chest pain, palpitations and leg swelling.  Gastrointestinal:  Negative for abdominal pain.  Endocrine: Negative for polydipsia.  Skin:  Negative for rash.  Neurological:  Negative for dizziness, weakness and headaches.  Hematological:  Does not bruise/bleed easily.  All other systems reviewed and are negative.      Objective:   Physical Exam Vitals and nursing note reviewed.  Constitutional:      General: She is not in acute distress.    Appearance: Normal appearance. She is well-developed.  HENT:     Head: Normocephalic.     Right Ear: Tympanic membrane normal.     Left Ear: Tympanic membrane normal.     Nose: Nose normal.     Mouth/Throat:     Mouth: Mucous membranes are moist.  Eyes:     Pupils: Pupils are equal, round, and reactive to light.  Neck:     Vascular: No carotid bruit or JVD.  Cardiovascular:     Rate and Rhythm: Normal rate and regular rhythm.     Heart sounds: Normal heart sounds.  Pulmonary:     Effort: Pulmonary effort is normal. No respiratory distress.     Breath sounds: Normal breath sounds. No wheezing or rales.  Chest:     Chest wall: No tenderness.  Abdominal:     General: Bowel sounds are normal. There is no distension or abdominal bruit.     Palpations: Abdomen is soft. There is no hepatomegaly, splenomegaly, mass or pulsatile mass.     Tenderness: There is no abdominal tenderness.  Musculoskeletal:        General: Normal range of motion.     Cervical back: Normal range of motion and neck supple.  Lymphadenopathy:     Cervical: No cervical adenopathy.  Skin:    General:  Skin is warm and dry.  Neurological:     Mental Status: She is alert and oriented to person, place, and time.     Deep Tendon Reflexes: Reflexes are normal and symmetric.  Psychiatric:        Behavior: Behavior normal.        Thought Content: Thought content normal.        Judgment: Judgment normal.     BP 121/86   Pulse (!) 48   Temp 97.7 F (36.5 C) (Temporal)   Ht 5\' 2"  (1.575 m)   Wt 153 lb (69.4 kg)   SpO2 96%   BMI 27.98 kg/m         Assessment & Plan:  Laura Combs comes in today with chief complaint of Medical Management of Chronic Issues   Diagnosis and orders addressed:  1. Primary hypertension (Primary) Low sodium diet - CBC with Differential/Platelet - CMP14+EGFR - Lipid panel - lisinopril  (ZESTRIL ) 20 MG tablet; Take 1 tablet (20 mg total) by mouth daily.  Dispense: 90 tablet; Refill: 1  2. Primary insomnia Bedtime routine - ToxASSURE Select 13 (MW), Urine - zolpidem  (AMBIEN ) 10 MG tablet; Take 1 tablet (10 mg total) by mouth at bedtime as needed for sleep.  Dispense: 30 tablet; Refill: 5  3. GAD (generalized anxiety disorder) Stress management - busPIRone  (BUSPAR ) 10 MG tablet; Take 1 tablet (10 mg total) by mouth daily as needed.  Dispense: 30 tablet; Refill: 5 - escitalopram  (LEXAPRO ) 20 MG tablet; Take 1 tablet (20 mg total) by mouth daily.  Dispense: 90 tablet; Refill: 1  4. Menstrual migraine without status migrainosus, not intractable   5. Abnormal liver function Labs pending  6. Gastroesophageal reflux disease without esophagitis Avoid spicy foods Do not eat 2 hours prior to bedtime  - pantoprazole  (PROTONIX ) 40 MG tablet; Take 1 tablet (40 mg total) by mouth daily.  Dispense: 90 tablet; Refill: 1   Labs pending Health Maintenance reviewed Diet and exercise encouraged  Follow up plan: 6 months   Mary-Margaret Gaylyn Keas, FNP

## 2023-12-12 ENCOUNTER — Ambulatory Visit: Payer: Self-pay | Admitting: Nurse Practitioner

## 2023-12-21 ENCOUNTER — Ambulatory Visit: Admitting: Nurse Practitioner

## 2024-04-05 ENCOUNTER — Other Ambulatory Visit: Payer: Self-pay | Admitting: Nurse Practitioner

## 2024-04-05 DIAGNOSIS — F411 Generalized anxiety disorder: Secondary | ICD-10-CM

## 2024-05-16 ENCOUNTER — Telehealth: Admitting: Nurse Practitioner

## 2024-05-16 ENCOUNTER — Encounter: Payer: Self-pay | Admitting: Nurse Practitioner

## 2024-05-16 DIAGNOSIS — J0101 Acute recurrent maxillary sinusitis: Secondary | ICD-10-CM

## 2024-05-16 MED ORDER — HYDROCODONE BIT-HOMATROP MBR 5-1.5 MG/5ML PO SOLN: 5.0000 mL | Freq: Four times a day (QID) | ORAL | 0 refills | Status: DC | PRN

## 2024-05-16 MED ORDER — AMOXICILLIN-POT CLAVULANATE 875-125 MG PO TABS: 1.0000 | ORAL_TABLET | Freq: Two times a day (BID) | ORAL | 0 refills | Status: DC

## 2024-05-16 NOTE — Progress Notes (Signed)
 Virtual Visit Consent   Laura Combs, you are scheduled for a virtual visit with Mary-Margaret Gladis, FNP, a [facility] provider, today.     Just as with appointments in the office, your consent must be obtained to participate.  Your consent will be active for this visit and any virtual visit you may have with one of our providers in the next 365 days.     If you have a MyChart account, a copy of this consent can be sent to you electronically.  All virtual visits are billed to your insurance company just like a traditional visit in the office.    As this is a virtual visit, video technology does not allow for your provider to perform a traditional examination.  This may limit your provider's ability to fully assess your condition.  If your provider identifies any concerns that need to be evaluated in person or the need to arrange testing (such as labs, EKG, etc.), we will make arrangements to do so.     Although advances in technology are sophisticated, we cannot ensure that it will always work on either your end or our end.  If the connection with a video visit is poor, the visit may have to be switched to a telephone visit.  With either a video or telephone visit, we are not always able to ensure that we have a secure connection.     I need to obtain your verbal consent now.   Are you willing to proceed with your visit today? YES   Ronan Riegler has provided verbal consent on 05/16/2024 for a virtual visit (video or telephone).   Mary-Margaret Gladis, FNP   Date: 05/16/2024 7:47 AM   Virtual Visit via Video Note   I, Mary-Margaret Efrem Pitstick, connected with Zabria Liss ([id number], 06/19/1981) on 05/16/24 at  8:45 AM EST by a video-enabled telemedicine application and verified that I am speaking with the correct person using two identifiers.  Location: Patient: Virtual Visit Location Patient: Mobile Provider: Virtual Visit Location Provider: Mobile   I discussed the  limitations of evaluation and management by telemedicine and the availability of in person appointments. The patient expressed understanding and agreed to proceed.    History of Present Illness: Laura Combs is a 43 y.o. who identifies as a female who was assigned female at birth, and is being seen today for sinusitis .  HPI: Sinusitis This is a new problem. The current episode started in the past 7 days. The problem has been waxing and waning since onset. There has been no fever. Her pain is at a severity of 4/10. Associated symptoms include congestion, coughing, headaches, sinus pressure and a sore throat. Past treatments include oral decongestants and acetaminophen . The treatment provided mild relief.    Review of Systems  HENT:  Positive for congestion, sinus pressure and sore throat.   Respiratory:  Positive for cough.   Neurological:  Positive for headaches.    Problems:  Patient Active Problem List   Diagnosis Date Noted   BMI 27.0-27.9,adult 02/15/2022   Gastroesophageal reflux disease without esophagitis 02/15/2022   Insomnia 11/16/2020   Abnormal liver function 05/22/2019   Hypertensive disorder 02/08/2017   GAD (generalized anxiety disorder) 03/02/2016   Migraine 09/28/2015    Allergies: No Known Allergies Medications:  Current Outpatient Medications:    busPIRone  (BUSPAR ) 10 MG tablet, TAKE ONE TABLET ONCE DAILY AS NEEDED, Disp: 30 tablet, Rfl: 5   escitalopram  (LEXAPRO ) 20 MG tablet, Take 1 tablet (20 mg  total) by mouth daily., Disp: 90 tablet, Rfl: 1   lisinopril  (ZESTRIL ) 20 MG tablet, Take 1 tablet (20 mg total) by mouth daily., Disp: 90 tablet, Rfl: 1   NUVARING 0.12-0.015 MG/24HR vaginal ring, INSERT ONE RING VAGINALLY ONCE A MONTH, Disp: , Rfl:    pantoprazole  (PROTONIX ) 40 MG tablet, Take 1 tablet (40 mg total) by mouth daily., Disp: 90 tablet, Rfl: 1   SUMAtriptan  (IMITREX ) 100 MG tablet, Take 1 tablet (100 mg total) by mouth once for 1 dose. May repeat in 2  hours if headache persists or recurs., Disp: 30 tablet, Rfl: 1   zolpidem  (AMBIEN ) 10 MG tablet, Take 1 tablet (10 mg total) by mouth at bedtime as needed for sleep., Disp: 30 tablet, Rfl: 5  Observations/Objective: Patient is well-developed, well-nourished in no acute distress.  Resting comfortably  at home.  Head is normocephalic, atraumatic.  No labored breathing.  Speech is clear and coherent with logical content.  Patient is alert and oriented at baseline.  Wet cough Maxillary sinus pressure.  Assessment and Plan:   [place] Ramos in today with chief complaint of No chief complaint on file.   1. Acute recurrent maxillary sinusitis (Primary) 1. Take meds as prescribed 2. Use a cool mist humidifier especially during the winter months and when heat has been humid. 3. Use saline nose sprays frequently 4. Saline irrigations of the nose can be very helpful if done frequently.  * 4X daily for 1 week*  * Use of a nettie pot can be helpful with this. Follow directions with this* 5. Drink plenty of fluids 6. Keep thermostat turn down low 7.For any cough or congestion- mucinex  during then day 8. For fever or aces or pains- take tylenol  or ibuprofen  appropriate for age and weight.  * for fevers greater than 101 orally you may alternate ibuprofen  and tylenol  every  3 hours.    - amoxicillin -clavulanate (AUGMENTIN ) 875-125 MG tablet; Take 1 tablet by mouth 2 (two) times daily.  Dispense: 14 tablet; Refill: 0 - HYDROcodone  bit-homatropine (HYCODAN) 5-1.5 MG/5ML syrup; Take 5 mLs by mouth every 6 (six) hours as needed for cough.  Dispense: 120 mL; Refill: 0    The above assessment and management plan was discussed with the patient. The patient verbalized understanding of and has agreed to the management plan. Patient is aware to call the clinic if symptoms persist or worsen. Patient is aware when to return to the clinic for a follow-up visit. Patient educated on when it is appropriate to  go to the emergency department.   Mary-Margaret Gladis, FNP    Follow Up Instructions: I discussed the assessment and treatment plan with the patient. The patient was provided an opportunity to ask questions and all were answered. The patient agreed with the plan and demonstrated an understanding of the instructions.  A copy of instructions were sent to the patient via MyChart.  The patient was advised to call back or seek an in-person evaluation if the symptoms worsen or if the condition fails to improve as anticipated.  Time:  I spent 7 minutes with the patient via telehealth technology discussing the above problems/concerns.    Mary-Margaret Gladis, FNP

## 2024-05-16 NOTE — Patient Instructions (Signed)
 1. Take meds as prescribed 2. Use a cool mist humidifier especially during the winter months and when heat has been humid. 3. Use saline nose sprays frequently 4. Saline irrigations of the nose can be very helpful if done frequently.  * 4X daily for 1 week*  * Use of a nettie pot can be helpful with this. Follow directions with this* 5. Drink plenty of fluids 6. Keep thermostat turn down low 7.For any cough or congestion- mucinex  during the day 8. For fever or aces or pains- take tylenol  or ibuprofen  appropriate for age and weight.  * for fevers greater than 101 orally you may alternate ibuprofen  and tylenol  every  3 hours.

## 2024-05-20 ENCOUNTER — Telehealth: Admitting: Nurse Practitioner

## 2024-05-20 DIAGNOSIS — J0101 Acute recurrent maxillary sinusitis: Secondary | ICD-10-CM | POA: Diagnosis not present

## 2024-05-20 DIAGNOSIS — J069 Acute upper respiratory infection, unspecified: Secondary | ICD-10-CM

## 2024-05-20 MED ORDER — FLUCONAZOLE 150 MG PO TABS: ORAL_TABLET | ORAL | 0 refills | Status: DC

## 2024-05-20 MED ORDER — HYDROCODONE BIT-HOMATROP MBR 5-1.5 MG/5ML PO SOLN: 5.0000 mL | Freq: Four times a day (QID) | ORAL | 0 refills | Status: DC | PRN

## 2024-05-20 MED ORDER — PREDNISONE 20 MG PO TABS: 40.0000 mg | ORAL_TABLET | Freq: Every day | ORAL | 0 refills | Status: AC

## 2024-05-20 NOTE — Patient Instructions (Addendum)

## 2024-05-20 NOTE — Progress Notes (Signed)
 Virtual Visit Consent   Laura Combs, you are scheduled for a virtual visit with Mary-Margaret Gladis, FNP, a [facility] provider, today.     Just as with appointments in the office, your consent must be obtained to participate.  Your consent will be active for this visit and any virtual visit you may have with one of our providers in the next 365 days.     If you have a MyChart account, a copy of this consent can be sent to you electronically.  All virtual visits are billed to your insurance company just like a traditional visit in the office.    As this is a virtual visit, video technology does not allow for your provider to perform a traditional examination.  This may limit your provider's ability to fully assess your condition.  If your provider identifies any concerns that need to be evaluated in person or the need to arrange testing (such as labs, EKG, etc.), we will make arrangements to do so.     Although advances in technology are sophisticated, we cannot ensure that it will always work on either your end or our end.  If the connection with a video visit is poor, the visit may have to be switched to a telephone visit.  With either a video or telephone visit, we are not always able to ensure that we have a secure connection.     I need to obtain your verbal consent now.   Are you willing to proceed with your visit today? YES   Jaidynn Levins has provided verbal consent on 05/20/2024 for a virtual visit (video or telephone).   Mary-Margaret Gladis, FNP   Date: 05/20/2024 8:05 AM   Virtual Visit via Video Note   I, Mary-Margaret Gladis, connected with Laura Combs ([id number], 22-Mar-1981) on 05/20/24 at 11:30 AM EST by a video-enabled telemedicine application and verified that I am speaking with the correct person using two identifiers.  Location: Patient: Virtual Visit Location Patient: Mobile Provider: Virtual Visit Location Provider: Mobile   I discussed the  limitations of evaluation and management by telemedicine and the availability of in person appointments. The patient expressed understanding and agreed to proceed.    History of Present Illness: Laura Combs is a 43 y.o. who identifies as a female who was assigned female at birth, and is being seen today for sinusitis.  HPI: Patent did televisit on Thursday 05/16/24 and was dx with sinusitis. Was treated with augmentin  and hycodan for coug. The ocugh has worsened and she has a bad headache. Rates headache 7/10.     Review of Systems  Constitutional:  Negative for chills and fever.  HENT:  Positive for congestion.   Eyes:  Negative for blurred vision, double vision and photophobia.  Respiratory:  Positive for cough and sputum production. Negative for shortness of breath.   Neurological:  Positive for headaches. Negative for dizziness.    Problems:  Patient Active Problem List   Diagnosis Date Noted   BMI 27.0-27.9,adult 02/15/2022   Gastroesophageal reflux disease without esophagitis 02/15/2022   Insomnia 11/16/2020   Abnormal liver function 05/22/2019   Hypertensive disorder 02/08/2017   GAD (generalized anxiety disorder) 03/02/2016   Migraine 09/28/2015    Allergies: No Known Allergies Medications:  Current Outpatient Medications:    amoxicillin -clavulanate (AUGMENTIN ) 875-125 MG tablet, Take 1 tablet by mouth 2 (two) times daily., Disp: 14 tablet, Rfl: 0   busPIRone  (BUSPAR ) 10 MG tablet, TAKE ONE TABLET ONCE DAILY AS NEEDED, Disp:  30 tablet, Rfl: 5   escitalopram  (LEXAPRO ) 20 MG tablet, Take 1 tablet (20 mg total) by mouth daily., Disp: 90 tablet, Rfl: 1   HYDROcodone  bit-homatropine (HYCODAN) 5-1.5 MG/5ML syrup, Take 5 mLs by mouth every 6 (six) hours as needed for cough., Disp: 120 mL, Rfl: 0   lisinopril  (ZESTRIL ) 20 MG tablet, Take 1 tablet (20 mg total) by mouth daily., Disp: 90 tablet, Rfl: 1   NUVARING 0.12-0.015 MG/24HR vaginal ring, INSERT ONE RING VAGINALLY ONCE A  MONTH, Disp: , Rfl:    pantoprazole  (PROTONIX ) 40 MG tablet, Take 1 tablet (40 mg total) by mouth daily., Disp: 90 tablet, Rfl: 1   SUMAtriptan  (IMITREX ) 100 MG tablet, Take 1 tablet (100 mg total) by mouth once for 1 dose. May repeat in 2 hours if headache persists or recurs., Disp: 30 tablet, Rfl: 1   zolpidem  (AMBIEN ) 10 MG tablet, Take 1 tablet (10 mg total) by mouth at bedtime as needed for sleep., Disp: 30 tablet, Rfl: 5  Observations/Objective: Patient is well-developed, well-nourished in no acute distress.  Resting comfortably  at home.  Head is normocephalic, atraumatic.  No labored breathing.  Speech is clear and coherent with logical content.  Patient is alert and oriented at baseline.  Deep wet cough  Assessment and Plan:  [name] in today with chief complaint of No chief complaint on file.   1. Acute recurrent maxillary sinusitis  2. URI with cough and congestion (Primary) 1. Take meds as prescribed 2. Use a cool mist humidifier especially during the winter months and when heat has been humid. 3. Use saline nose sprays frequently 4. Saline irrigations of the nose can be very helpful if done frequently.  * 4X daily for 1 week*  * Use of a nettie pot can be helpful with this. Follow directions with this* 5. Drink plenty of fluids 6. Keep thermostat turn down low 7.For any cough or congestion- hycodan 8. For fever or aces or pains- take tylenol  or ibuprofen  appropriate for age and weight.  * for fevers greater than 101 orally you may alternate ibuprofen  and tylenol  every  3 hours.    - HYDROcodone  bit-homatropine (HYCODAN) 5-1.5 MG/5ML syrup; Take 5 mLs by mouth every 6 (six) hours as needed for cough.  Dispense: 120 mL; Refill: 0 - predniSONE  (DELTASONE ) 20 MG tablet; Take 2 tablets (40 mg total) by mouth daily with breakfast for 5 days. 2 po daily for 5 days  Dispense: 10 tablet; Refill: 0     Follow Up Instructions: I discussed the assessment and  treatment plan with the patient. The patient was provided an opportunity to ask questions and all were answered. The patient agreed with the plan and demonstrated an understanding of the instructions.  A copy of instructions were sent to the patient via MyChart.  The patient was advised to call back or seek an in-person evaluation if the symptoms worsen or if the condition fails to improve as anticipated.  Time:  I spent 8 minutes with the patient via telehealth technology discussing the above problems/concerns.    Mary-Margaret Gladis, FNP

## 2024-05-23 NOTE — Telephone Encounter (Signed)
Okay to extend note? 

## 2024-05-23 NOTE — Telephone Encounter (Signed)
 Ok for work note?

## 2024-05-27 ENCOUNTER — Other Ambulatory Visit: Payer: Self-pay | Admitting: Nurse Practitioner

## 2024-05-27 DIAGNOSIS — J069 Acute upper respiratory infection, unspecified: Secondary | ICD-10-CM

## 2024-05-27 DIAGNOSIS — G43829 Menstrual migraine, not intractable, without status migrainosus: Secondary | ICD-10-CM

## 2024-06-20 ENCOUNTER — Encounter

## 2024-06-21 ENCOUNTER — Telehealth: Admitting: Nurse Practitioner

## 2024-06-21 ENCOUNTER — Other Ambulatory Visit: Payer: Self-pay | Admitting: Nurse Practitioner

## 2024-06-21 ENCOUNTER — Encounter: Payer: Self-pay | Admitting: Nurse Practitioner

## 2024-06-21 DIAGNOSIS — F5101 Primary insomnia: Secondary | ICD-10-CM

## 2024-06-21 DIAGNOSIS — R6889 Other general symptoms and signs: Secondary | ICD-10-CM

## 2024-06-21 MED ORDER — PROMETHAZINE-DM 6.25-15 MG/5ML PO SYRP: 5.0000 mL | ORAL_SOLUTION | Freq: Four times a day (QID) | ORAL | 0 refills | Status: DC | PRN

## 2024-06-21 MED ORDER — OSELTAMIVIR PHOSPHATE 75 MG PO CAPS: 75.0000 mg | ORAL_CAPSULE | Freq: Two times a day (BID) | ORAL | 0 refills | Status: DC

## 2024-06-21 NOTE — Progress Notes (Signed)
 "   Virtual Visit Consent   Laura Combs, you are scheduled for a virtual visit with Laura Gladis, FNP, a [facility] provider, today.     Just as with appointments in the office, your consent must be obtained to participate.  Your consent will be active for this visit and any virtual visit you may have with one of our providers in the next 365 days.     If you have a MyChart account, a copy of this consent can be sent to you electronically.  All virtual visits are billed to your insurance company just like a traditional visit in the office.    As this is a virtual visit, video technology does not allow for your provider to perform a traditional examination.  This may limit your provider's ability to fully assess your condition.  If your provider identifies any concerns that need to be evaluated in person or the need to arrange testing (such as labs, EKG, etc.), we will make arrangements to do so.     Although advances in technology are sophisticated, we cannot ensure that it will always work on either your end or our end.  If the connection with a video visit is poor, the visit may have to be switched to a telephone visit.  With either a video or telephone visit, we are not always able to ensure that we have a secure connection.     I need to obtain your verbal consent now.   Are you willing to proceed with your visit today? YES   Laura Combs has provided verbal consent on 06/21/2024 for a virtual visit (video or telephone).   Laura Gladis, FNP   Date: 06/21/2024 9:14 AM   Virtual Visit via Video Note   I, Laura Combs, connected with Laura Combs ([id number], 09/08/1980) on 06/21/2024 at  9:00 AM EST by a video-enabled telemedicine application and verified that I am speaking with the correct person using two identifiers.  Location: Patient: Virtual Visit Location Patient: Mobile Provider: Virtual Visit Location Provider: Mobile   I discussed the  limitations of evaluation and management by telemedicine and the availability of in person appointments. The patient expressed understanding and agreed to proceed.    History of Present Illness: Laura Combs is a 43 y.o. who identifies as a female who was assigned female at birth, and is being seen today for uri.  HPI: URI  This is a new problem. The current episode started yesterday. The problem has been gradually worsening. The maximum temperature recorded prior to her arrival was 102 - 102.9 F. The fever has been present for Less than 1 day. Associated symptoms include congestion, coughing and rhinorrhea. She has tried decongestant for the symptoms. The treatment provided mild relief.    Review of Systems  HENT:  Positive for congestion and rhinorrhea.   Respiratory:  Positive for cough.     Problems:  Patient Active Problem List   Diagnosis Date Noted   BMI 27.0-27.9,adult 02/15/2022   Gastroesophageal reflux disease without esophagitis 02/15/2022   Insomnia 11/16/2020   Abnormal liver function 05/22/2019   Hypertensive disorder 02/08/2017   GAD (generalized anxiety disorder) 03/02/2016   Migraine 09/28/2015    Allergies: Allergies[1] Medications: Current Medications[2]  Observations/Objective: Patient is well-developed, well-nourished in no acute distress.  Resting comfortably  at home.  Head is normocephalic, atraumatic.  No labored breathing.  Speech is clear and coherent with logical content.  Patient is alert and oriented at baseline.  Face flushed  Scratchy throat  Assessment and Plan:  Laura Combs in today with chief complaint of No chief complaint on file.   1. Influenza-like symptoms (Primary) 1. Take meds as prescribed 2. Use a cool mist humidifier especially during the winter months and when heat has been humid. 3. Use saline nose sprays frequently 4. Saline irrigations of the nose can be very helpful if done frequently.  * 4X daily for 1 week*  *  Use of a nettie pot can be helpful with this. Follow directions with this* 5. Drink plenty of fluids 6. Keep thermostat turn down low 7.For any cough or congestion- mucinex  or delsym 8. For fever or aces or pains- take tylenol  or ibuprofen  appropriate for age and weight.  * for fevers greater than 101 orally you may alternate ibuprofen  and tylenol  every  3 hours.    Meds ordered this encounter  Medications   oseltamivir  (TAMIFLU ) 75 MG capsule    Sig: Take 1 capsule (75 mg total) by mouth 2 (two) times daily.    Dispense:  10 capsule    Refill:  0    Supervising Provider:   MARYANNE CHEW A [[id number]]        Follow Up Instructions: I discussed the assessment and treatment plan with the patient. The patient was provided an opportunity to ask questions and all were answered. The patient agreed with the plan and demonstrated an understanding of the instructions.  A copy of instructions were sent to the patient via MyChart.  The patient was advised to call back or seek an in-person evaluation if the symptoms worsen or if the condition fails to improve as anticipated.  Time:  I spent 8 minutes with the patient via telehealth technology discussing the above problems/concerns.    Laura Gladis, FNP    [1] No Known Allergies [2]  Current Outpatient Medications:    amoxicillin -clavulanate (AUGMENTIN ) 875-125 MG tablet, Take 1 tablet by mouth 2 (two) times daily., Disp: 14 tablet, Rfl: 0   busPIRone  (BUSPAR ) 10 MG tablet, TAKE ONE TABLET ONCE DAILY AS NEEDED, Disp: 30 tablet, Rfl: 5   escitalopram  (LEXAPRO ) 20 MG tablet, Take 1 tablet (20 mg total) by mouth daily., Disp: 90 tablet, Rfl: 1   fluconazole  (DIFLUCAN ) 150 MG tablet, 1 po q week x 4 weeks, Disp: 4 tablet, Rfl: 0   HYDROcodone  bit-homatropine (HYCODAN) 5-1.5 MG/5ML syrup, Take 5 mLs by mouth every 6 (six) hours as needed for cough., Disp: 120 mL, Rfl: 0   lisinopril  (ZESTRIL ) 20 MG tablet, Take 1 tablet (20 mg total)  by mouth daily., Disp: 90 tablet, Rfl: 1   NUVARING 0.12-0.015 MG/24HR vaginal ring, INSERT ONE RING VAGINALLY ONCE A MONTH, Disp: , Rfl:    pantoprazole  (PROTONIX ) 40 MG tablet, Take 1 tablet (40 mg total) by mouth daily., Disp: 90 tablet, Rfl: 1   SUMAtriptan  (IMITREX ) 100 MG tablet, Take one tablet once for one dose. May repeat in Two hours if needed. **NEEDS TO BE SEEN BEFORE NEXT REFILL**, Disp: 30 tablet, Rfl: 0   zolpidem  (AMBIEN ) 10 MG tablet, Take 1 tablet (10 mg total) by mouth at bedtime as needed for sleep., Disp: 30 tablet, Rfl: 5  "

## 2024-06-22 ENCOUNTER — Other Ambulatory Visit: Payer: Self-pay | Admitting: Nurse Practitioner

## 2024-06-22 DIAGNOSIS — F5101 Primary insomnia: Secondary | ICD-10-CM

## 2024-06-25 ENCOUNTER — Ambulatory Visit: Admitting: Nurse Practitioner

## 2024-06-25 VITALS — BP 136/93 | HR 81 | Temp 97.2°F | Ht 62.0 in | Wt 160.4 lb

## 2024-06-25 DIAGNOSIS — G43829 Menstrual migraine, not intractable, without status migrainosus: Secondary | ICD-10-CM

## 2024-06-25 DIAGNOSIS — Z6827 Body mass index (BMI) 27.0-27.9, adult: Secondary | ICD-10-CM

## 2024-06-25 DIAGNOSIS — K219 Gastro-esophageal reflux disease without esophagitis: Secondary | ICD-10-CM

## 2024-06-25 DIAGNOSIS — F411 Generalized anxiety disorder: Secondary | ICD-10-CM

## 2024-06-25 DIAGNOSIS — I10 Essential (primary) hypertension: Secondary | ICD-10-CM

## 2024-06-25 DIAGNOSIS — F5101 Primary insomnia: Secondary | ICD-10-CM

## 2024-06-25 LAB — LIPID PANEL

## 2024-06-25 MED ORDER — ESCITALOPRAM OXALATE 20 MG PO TABS: 20.0000 mg | ORAL_TABLET | Freq: Every day | ORAL | 1 refills | Status: AC

## 2024-06-25 MED ORDER — PANTOPRAZOLE SODIUM 40 MG PO TBEC: 40.0000 mg | DELAYED_RELEASE_TABLET | Freq: Every day | ORAL | 1 refills | Status: AC

## 2024-06-25 MED ORDER — ZOLPIDEM TARTRATE 10 MG PO TABS: 10.0000 mg | ORAL_TABLET | Freq: Every evening | ORAL | 5 refills | Status: AC | PRN

## 2024-06-25 MED ORDER — LISINOPRIL 20 MG PO TABS: 20.0000 mg | ORAL_TABLET | Freq: Every day | ORAL | 1 refills | Status: AC

## 2024-06-25 MED ORDER — SUMATRIPTAN SUCCINATE 100 MG PO TABS: ORAL_TABLET | ORAL | 2 refills | Status: AC

## 2024-06-25 NOTE — Progress Notes (Signed)
 "  Subjective:    Patient ID: Laura Combs, female    DOB: 08/20/1980, 43 y.o.   MRN: [id number]   Chief Complaint: medical management of chronic issues     HPI:  Laura Combs is a 43 y.o. who identifies as a female who was assigned female at birth.   Social history: Lives with: husband and 2 kids Work history: engineer, site   Comes in today for follow up of the following chronic medical issues:  1. Primary hypertension No c/o chest pain, sob or headache. Does not check blood pressure at home. BP Readings from Last 3 Encounters:  12/11/23 121/86  07/28/23 138/88  03/14/23 132/87     2. Primary insomnia Is on ambien  to sleep and is not able to sleep without it. Some nights she    3. GAD (generalized anxiety disorder) Is on lexapro  and buspar . Combination works well for her.     06/25/2024    2:55 PM 12/11/2023    3:32 PM 03/14/2023    3:34 PM 12/27/2022    2:17 PM  GAD 7 : Generalized Anxiety Score  Nervous, Anxious, on Edge 0 0 0 0  Control/stop worrying 0 0 0 0  Worry too much - different things 0 0 0 0  Trouble relaxing 0 0 0 0  Restless 0 0 0 0  Easily annoyed or irritable 0 0 0 0  Afraid - awful might happen 0 0 0 0  Total GAD 7 Score 0 0 0 0  Anxiety Difficulty Not difficult at all Not difficult at all Not difficult at all Not difficult at all       06/25/2024    2:55 PM 12/11/2023    3:32 PM 07/28/2023    2:59 PM  Depression screen PHQ 2/9  Decreased Interest 0 0 0  Down, Depressed, Hopeless 0 0 0  PHQ - 2 Score 0 0 0    4. Menstrual migraine without status migrainosus, not intractable Having at least 1 migraine a wee. Imitrex  works but is needing more often. Running out of imitrex  before the end of the  month.  5. Abnormal liver function Lab Results  Component Value Date   ALT 14 03/14/2023   AST 17 03/14/2023   ALKPHOS 59 03/14/2023   BILITOT <0.2 03/14/2023     6. BMI 27.0-27.9 (HCC) Weight up 7lbs  Wt Readings from Last 3  Encounters:  06/25/24 160 lb 6.4 oz (72.8 kg)  12/11/23 153 lb (69.4 kg)  07/28/23 150 lb (68 kg)   BMI Readings from Last 3 Encounters:  06/25/24 29.34 kg/m  12/11/23 27.98 kg/m  07/28/23 27.44 kg/m     New complaints: Patient had flu last week and was treated with tamiflu - still has cough. Very sore throat.  No Known Allergies Outpatient Encounter Medications as of 06/25/2024  Medication Sig   amoxicillin -clavulanate (AUGMENTIN ) 875-125 MG tablet Take 1 tablet by mouth 2 (two) times daily.   busPIRone  (BUSPAR ) 10 MG tablet TAKE ONE TABLET ONCE DAILY AS NEEDED   escitalopram  (LEXAPRO ) 20 MG tablet Take 1 tablet (20 mg total) by mouth daily.   fluconazole  (DIFLUCAN ) 150 MG tablet 1 po q week x 4 weeks   HYDROcodone  bit-homatropine (HYCODAN) 5-1.5 MG/5ML syrup Take 5 mLs by mouth every 6 (six) hours as needed for cough.   lisinopril  (ZESTRIL ) 20 MG tablet Take 1 tablet (20 mg total) by mouth daily.   NUVARING 0.12-0.015 MG/24HR vaginal ring INSERT ONE RING VAGINALLY ONCE A MONTH  oseltamivir  (TAMIFLU ) 75 MG capsule Take 1 capsule (75 mg total) by mouth 2 (two) times daily.   pantoprazole  (PROTONIX ) 40 MG tablet Take 1 tablet (40 mg total) by mouth daily.   promethazine -dextromethorphan (PROMETHAZINE -DM) 6.25-15 MG/5ML syrup Take 5 mLs by mouth 4 (four) times daily as needed for cough.   SUMAtriptan  (IMITREX ) 100 MG tablet Take one tablet once for one dose. May repeat in Two hours if needed. **NEEDS TO BE SEEN BEFORE NEXT REFILL**   zolpidem  (AMBIEN ) 10 MG tablet Take 1 tablet (10 mg total) by mouth at bedtime as needed for sleep.   No facility-administered encounter medications on file as of 06/25/2024.    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  Eyes:  Negative for pain.  Respiratory:  Negative for shortness of breath.   Cardiovascular:  Negative for chest pain, palpitations and leg swelling.  Gastrointestinal:  Negative for abdominal pain.  Endocrine: Negative for polydipsia.  Skin:  Negative for rash.  Neurological:  Negative for dizziness, weakness and headaches.  Hematological:  Does not bruise/bleed easily.  All other systems reviewed and are negative.      Objective:   Physical Exam Vitals and nursing note reviewed.  Constitutional:      General: She is not in acute distress.    Appearance: Normal appearance. She is well-developed.  HENT:     Head: Normocephalic.     Right Ear: Tympanic membrane normal.     Left Ear: Tympanic membrane normal.     Nose: Nose normal.     Mouth/Throat:     Mouth: Mucous membranes are moist.  Eyes:     Pupils: Pupils are equal, round, and reactive to light.  Neck:     Vascular: No carotid bruit or JVD.  Cardiovascular:     Rate and Rhythm: Normal rate and regular rhythm.     Heart sounds: Normal heart sounds.  Pulmonary:     Effort: Pulmonary effort is normal. No respiratory distress.     Breath sounds: Normal breath sounds. No wheezing or rales.  Chest:     Chest wall: No tenderness.  Abdominal:     General: Bowel sounds are normal. There is no distension or abdominal  bruit.     Palpations: Abdomen is soft. There is no hepatomegaly, splenomegaly, mass or pulsatile mass.     Tenderness: There is no abdominal tenderness.  Musculoskeletal:        General: Normal range of motion.     Cervical back: Normal range of motion and neck supple.  Lymphadenopathy:  Cervical: No cervical adenopathy.  Skin:    General: Skin is warm and dry.  Neurological:     Mental Status: She is alert and oriented to person, place, and time.     Deep Tendon Reflexes: Reflexes are normal and symmetric.  Psychiatric:        Behavior: Behavior normal.        Thought Content: Thought content normal.        Judgment: Judgment normal.     BP (!) 136/93   Pulse 81   Temp (!) 97.2 F (36.2 C) (Temporal)   Ht 5' 2 (1.575 m)   Wt 160 lb 6.4 oz (72.8 kg)   SpO2 98%   BMI 29.34 kg/m        Assessment & Plan:  Laura Combs comes in today with chief complaint of medical management of chronic issues    Diagnosis and orders addressed:  1. Primary hypertension (Primary) Low sodium diet - CBC with Differential/Platelet - CMP14+EGFR - Lipid panel - lisinopril  (ZESTRIL ) 20 MG tablet; Take 1 tablet (20 mg total) by mouth daily.  Dispense: 90 tablet; Refill: 1  2. Primary insomnia Bedtime routine - ToxASSURE Select 13 (MW), Urine - zolpidem  (AMBIEN ) 10 MG tablet; Take 1 tablet (10 mg total) by mouth at bedtime as needed for sleep.  Dispense: 30 tablet; Refill: 5  3. GAD (generalized anxiety disorder) Stress management - busPIRone  (BUSPAR ) 10 MG tablet; Take 1 tablet (10 mg total) by mouth daily as needed.  Dispense: 30 tablet; Refill: 5 - escitalopram  (LEXAPRO ) 20 MG tablet; Take 1 tablet (20 mg total) by mouth daily.  Dispense: 90 tablet; Refill: 1  4. Menstrual migraine without status migrainosus, not intractable   5. Abnormal liver function Labs pending  6. Gastroesophageal reflux disease without esophagitis Avoid spicy foods Do not eat 2 hours prior to  bedtime  - pantoprazole  (PROTONIX ) 40 MG tablet; Take 1 tablet (40 mg total) by mouth daily.  Dispense: 90 tablet; Refill: 1  7. Cough Residual from flu A Continue OTC cough meds 'force fluids RTO prn  Labs pending Health Maintenance reviewed Diet and exercise encouraged  Follow up plan: 6 months   Mary-Margaret Gladis, FNP  "

## 2024-06-25 NOTE — Patient Instructions (Signed)

## 2024-06-26 LAB — CBC WITH DIFFERENTIAL/PLATELET
Basophils Absolute: 0 x10E3/uL (ref 0.0–0.2)
Basos: 0 %
EOS (ABSOLUTE): 0 x10E3/uL (ref 0.0–0.4)
Eos: 0 %
Hematocrit: 41.4 % (ref 34.0–46.6)
Hemoglobin: 13.9 g/dL (ref 11.1–15.9)
Immature Grans (Abs): 0 x10E3/uL (ref 0.0–0.1)
Immature Granulocytes: 0 %
Lymphocytes Absolute: 2.1 x10E3/uL (ref 0.7–3.1)
Lymphs: 29 %
MCH: 32 pg (ref 26.6–33.0)
MCHC: 33.6 g/dL (ref 31.5–35.7)
MCV: 95 fL (ref 79–97)
Monocytes Absolute: 0.5 x10E3/uL (ref 0.1–0.9)
Monocytes: 6 %
Neutrophils Absolute: 4.7 x10E3/uL (ref 1.4–7.0)
Neutrophils: 65 %
Platelets: 199 x10E3/uL (ref 150–450)
RBC: 4.34 x10E6/uL (ref 3.77–5.28)
RDW: 12.8 % (ref 11.7–15.4)
WBC: 7.4 x10E3/uL (ref 3.4–10.8)

## 2024-06-26 LAB — LIPID PANEL
Chol/HDL Ratio: 2.2 ratio (ref 0.0–4.4)
Cholesterol, Total: 153 mg/dL (ref 100–199)
HDL: 71 mg/dL
LDL Chol Calc (NIH): 63 mg/dL (ref 0–99)
Triglycerides: 110 mg/dL (ref 0–149)
VLDL Cholesterol Cal: 19 mg/dL (ref 5–40)

## 2024-06-26 LAB — CMP14+EGFR
ALT: 14 IU/L (ref 0–32)
AST: 14 IU/L (ref 0–40)
Albumin: 3.7 g/dL — ABNORMAL LOW (ref 3.9–4.9)
Alkaline Phosphatase: 95 IU/L (ref 41–116)
BUN/Creatinine Ratio: 15 (ref 9–23)
BUN: 12 mg/dL (ref 6–24)
Bilirubin Total: 0.2 mg/dL (ref 0.0–1.2)
CO2: 19 mmol/L — ABNORMAL LOW (ref 20–29)
Calcium: 7.9 mg/dL — ABNORMAL LOW (ref 8.7–10.2)
Chloride: 109 mmol/L — ABNORMAL HIGH (ref 96–106)
Creatinine, Ser: 0.82 mg/dL (ref 0.57–1.00)
Globulin, Total: 1.8 g/dL (ref 1.5–4.5)
Glucose: 77 mg/dL (ref 70–99)
Potassium: 3.9 mmol/L (ref 3.5–5.2)
Sodium: 140 mmol/L (ref 134–144)
Total Protein: 5.5 g/dL — ABNORMAL LOW (ref 6.0–8.5)
eGFR: 92 mL/min/1.73

## 2024-06-28 ENCOUNTER — Ambulatory Visit: Payer: Self-pay | Admitting: Nurse Practitioner

## 2024-12-20 ENCOUNTER — Ambulatory Visit: Admitting: Nurse Practitioner
